# Patient Record
Sex: Female | Born: 1980 | Race: White | Hispanic: No | Marital: Single | Smoking: Current every day smoker
Health system: Southern US, Community
[De-identification: ages and names within clinical notes are randomized; demographics above are authoritative.]

## PROBLEM LIST (undated history)

## (undated) ENCOUNTER — Emergency Department (HOSPITAL_COMMUNITY): Admission: EM | Payer: No Typology Code available for payment source | Source: Home / Self Care

## (undated) DIAGNOSIS — F1910 Other psychoactive substance abuse, uncomplicated: Secondary | ICD-10-CM

## (undated) DIAGNOSIS — B977 Papillomavirus as the cause of diseases classified elsewhere: Secondary | ICD-10-CM

## (undated) DIAGNOSIS — D1809 Hemangioma of other sites: Secondary | ICD-10-CM

## (undated) DIAGNOSIS — Z9851 Tubal ligation status: Secondary | ICD-10-CM

## (undated) DIAGNOSIS — J45909 Unspecified asthma, uncomplicated: Secondary | ICD-10-CM

## (undated) DIAGNOSIS — O3440 Maternal care for other abnormalities of cervix, unspecified trimester: Secondary | ICD-10-CM

## (undated) DIAGNOSIS — Z9889 Other specified postprocedural states: Secondary | ICD-10-CM

## (undated) DIAGNOSIS — N903 Dysplasia of vulva, unspecified: Secondary | ICD-10-CM

## (undated) HISTORY — PX: ABDOMINAL HYSTERECTOMY: SHX81

## (undated) HISTORY — PX: CHOLECYSTECTOMY: SHX55

## (undated) HISTORY — PX: APPENDECTOMY: SHX54

---

## 1998-05-11 ENCOUNTER — Emergency Department (HOSPITAL_COMMUNITY): Admission: EM | Admit: 1998-05-11 | Discharge: 1998-05-11 | Payer: Self-pay | Admitting: Emergency Medicine

## 1998-05-21 ENCOUNTER — Emergency Department (HOSPITAL_COMMUNITY): Admission: EM | Admit: 1998-05-21 | Discharge: 1998-05-21 | Payer: Self-pay | Admitting: Emergency Medicine

## 1998-05-22 ENCOUNTER — Emergency Department (HOSPITAL_COMMUNITY): Admission: EM | Admit: 1998-05-22 | Discharge: 1998-05-22 | Payer: Self-pay | Admitting: Emergency Medicine

## 1998-06-05 ENCOUNTER — Emergency Department (HOSPITAL_COMMUNITY): Admission: EM | Admit: 1998-06-05 | Discharge: 1998-06-05 | Payer: Self-pay | Admitting: Emergency Medicine

## 1998-08-04 ENCOUNTER — Emergency Department (HOSPITAL_COMMUNITY): Admission: EM | Admit: 1998-08-04 | Discharge: 1998-08-04 | Payer: Self-pay | Admitting: Emergency Medicine

## 1998-09-23 ENCOUNTER — Emergency Department (HOSPITAL_COMMUNITY): Admission: EM | Admit: 1998-09-23 | Discharge: 1998-09-24 | Payer: Self-pay | Admitting: Emergency Medicine

## 1998-11-25 ENCOUNTER — Emergency Department (HOSPITAL_COMMUNITY): Admission: EM | Admit: 1998-11-25 | Discharge: 1998-11-25 | Payer: Self-pay | Admitting: Emergency Medicine

## 1999-01-21 ENCOUNTER — Emergency Department (HOSPITAL_COMMUNITY): Admission: EM | Admit: 1999-01-21 | Discharge: 1999-01-21 | Payer: Self-pay | Admitting: Emergency Medicine

## 2000-02-12 ENCOUNTER — Emergency Department (HOSPITAL_COMMUNITY): Admission: EM | Admit: 2000-02-12 | Discharge: 2000-02-12 | Payer: Self-pay | Admitting: Emergency Medicine

## 2000-02-12 ENCOUNTER — Encounter: Payer: Self-pay | Admitting: Emergency Medicine

## 2000-06-13 ENCOUNTER — Emergency Department (HOSPITAL_COMMUNITY): Admission: EM | Admit: 2000-06-13 | Discharge: 2000-06-13 | Payer: Self-pay | Admitting: Emergency Medicine

## 2001-05-16 ENCOUNTER — Emergency Department (HOSPITAL_COMMUNITY): Admission: EM | Admit: 2001-05-16 | Discharge: 2001-05-16 | Payer: Self-pay | Admitting: *Deleted

## 2001-12-18 ENCOUNTER — Emergency Department (HOSPITAL_COMMUNITY): Admission: EM | Admit: 2001-12-18 | Discharge: 2001-12-18 | Payer: Self-pay | Admitting: Emergency Medicine

## 2001-12-18 ENCOUNTER — Encounter: Payer: Self-pay | Admitting: Emergency Medicine

## 2002-03-09 ENCOUNTER — Emergency Department (HOSPITAL_COMMUNITY): Admission: EM | Admit: 2002-03-09 | Discharge: 2002-03-09 | Payer: Self-pay | Admitting: Emergency Medicine

## 2002-07-15 ENCOUNTER — Other Ambulatory Visit: Admission: RE | Admit: 2002-07-15 | Discharge: 2002-07-15 | Payer: Self-pay | Admitting: *Deleted

## 2002-10-25 ENCOUNTER — Inpatient Hospital Stay (HOSPITAL_COMMUNITY): Admission: AD | Admit: 2002-10-25 | Discharge: 2002-10-25 | Payer: Self-pay | Admitting: *Deleted

## 2002-11-01 ENCOUNTER — Inpatient Hospital Stay (HOSPITAL_COMMUNITY): Admission: AD | Admit: 2002-11-01 | Discharge: 2002-11-01 | Payer: Self-pay | Admitting: *Deleted

## 2003-01-01 ENCOUNTER — Encounter: Admission: RE | Admit: 2003-01-01 | Discharge: 2003-01-01 | Payer: Self-pay | Admitting: *Deleted

## 2003-01-07 ENCOUNTER — Encounter: Admission: RE | Admit: 2003-01-07 | Discharge: 2003-01-07 | Payer: Self-pay | Admitting: *Deleted

## 2003-01-09 ENCOUNTER — Inpatient Hospital Stay (HOSPITAL_COMMUNITY): Admission: AD | Admit: 2003-01-09 | Discharge: 2003-01-11 | Payer: Self-pay | Admitting: *Deleted

## 2003-01-11 ENCOUNTER — Encounter: Payer: Self-pay | Admitting: *Deleted

## 2003-01-18 ENCOUNTER — Inpatient Hospital Stay (HOSPITAL_COMMUNITY): Admission: AD | Admit: 2003-01-18 | Discharge: 2003-01-18 | Payer: Self-pay | Admitting: *Deleted

## 2003-01-18 ENCOUNTER — Encounter (INDEPENDENT_AMBULATORY_CARE_PROVIDER_SITE_OTHER): Payer: Self-pay | Admitting: Specialist

## 2003-01-18 ENCOUNTER — Inpatient Hospital Stay (HOSPITAL_COMMUNITY): Admission: AD | Admit: 2003-01-18 | Discharge: 2003-01-21 | Payer: Self-pay | Admitting: *Deleted

## 2003-01-27 ENCOUNTER — Inpatient Hospital Stay: Admission: EM | Admit: 2003-01-27 | Discharge: 2003-01-27 | Payer: Self-pay | Admitting: *Deleted

## 2003-04-02 ENCOUNTER — Other Ambulatory Visit: Admission: RE | Admit: 2003-04-02 | Discharge: 2003-04-02 | Payer: Self-pay | Admitting: *Deleted

## 2004-11-29 ENCOUNTER — Inpatient Hospital Stay (HOSPITAL_COMMUNITY): Admission: AD | Admit: 2004-11-29 | Discharge: 2004-11-29 | Payer: Self-pay | Admitting: Family Medicine

## 2005-03-15 ENCOUNTER — Encounter (INDEPENDENT_AMBULATORY_CARE_PROVIDER_SITE_OTHER): Payer: Self-pay | Admitting: Specialist

## 2005-03-15 ENCOUNTER — Inpatient Hospital Stay (HOSPITAL_COMMUNITY): Admission: EM | Admit: 2005-03-15 | Discharge: 2005-03-16 | Payer: Self-pay | Admitting: Emergency Medicine

## 2006-03-03 ENCOUNTER — Emergency Department (HOSPITAL_COMMUNITY): Admission: EM | Admit: 2006-03-03 | Discharge: 2006-03-04 | Payer: Self-pay | Admitting: *Deleted

## 2006-03-12 ENCOUNTER — Emergency Department (HOSPITAL_COMMUNITY): Admission: EM | Admit: 2006-03-12 | Discharge: 2006-03-12 | Payer: Self-pay | Admitting: Emergency Medicine

## 2006-04-03 ENCOUNTER — Emergency Department (HOSPITAL_COMMUNITY): Admission: EM | Admit: 2006-04-03 | Discharge: 2006-04-03 | Payer: Self-pay | Admitting: Emergency Medicine

## 2006-04-09 ENCOUNTER — Inpatient Hospital Stay (HOSPITAL_COMMUNITY): Admission: AD | Admit: 2006-04-09 | Discharge: 2006-04-09 | Payer: Self-pay | Admitting: Gynecology

## 2006-04-11 ENCOUNTER — Other Ambulatory Visit: Admission: RE | Admit: 2006-04-11 | Discharge: 2006-04-11 | Payer: Self-pay | Admitting: Obstetrics and Gynecology

## 2006-05-13 ENCOUNTER — Emergency Department (HOSPITAL_COMMUNITY): Admission: EM | Admit: 2006-05-13 | Discharge: 2006-05-13 | Payer: Self-pay | Admitting: Emergency Medicine

## 2006-05-20 ENCOUNTER — Emergency Department (HOSPITAL_COMMUNITY): Admission: EM | Admit: 2006-05-20 | Discharge: 2006-05-20 | Payer: Self-pay | Admitting: Emergency Medicine

## 2006-05-23 ENCOUNTER — Emergency Department (HOSPITAL_COMMUNITY): Admission: EM | Admit: 2006-05-23 | Discharge: 2006-05-24 | Payer: Self-pay | Admitting: Emergency Medicine

## 2006-06-03 ENCOUNTER — Emergency Department (HOSPITAL_COMMUNITY): Admission: EM | Admit: 2006-06-03 | Discharge: 2006-06-03 | Payer: Self-pay | Admitting: Emergency Medicine

## 2006-06-06 ENCOUNTER — Emergency Department (HOSPITAL_COMMUNITY): Admission: EM | Admit: 2006-06-06 | Discharge: 2006-06-06 | Payer: Self-pay | Admitting: Emergency Medicine

## 2006-06-16 ENCOUNTER — Emergency Department (HOSPITAL_COMMUNITY): Admission: EM | Admit: 2006-06-16 | Discharge: 2006-06-16 | Payer: Self-pay | Admitting: Emergency Medicine

## 2006-06-28 ENCOUNTER — Ambulatory Visit: Payer: Self-pay | Admitting: Gastroenterology

## 2006-07-04 ENCOUNTER — Emergency Department (HOSPITAL_COMMUNITY): Admission: EM | Admit: 2006-07-04 | Discharge: 2006-07-04 | Payer: Self-pay | Admitting: Emergency Medicine

## 2006-07-06 ENCOUNTER — Emergency Department (HOSPITAL_COMMUNITY): Admission: EM | Admit: 2006-07-06 | Discharge: 2006-07-06 | Payer: Self-pay | Admitting: Emergency Medicine

## 2006-07-07 ENCOUNTER — Emergency Department (HOSPITAL_COMMUNITY): Admission: EM | Admit: 2006-07-07 | Discharge: 2006-07-07 | Payer: Self-pay | Admitting: Emergency Medicine

## 2006-07-10 ENCOUNTER — Emergency Department (HOSPITAL_COMMUNITY): Admission: EM | Admit: 2006-07-10 | Discharge: 2006-07-10 | Payer: Self-pay | Admitting: Emergency Medicine

## 2006-07-26 ENCOUNTER — Emergency Department (HOSPITAL_COMMUNITY): Admission: EM | Admit: 2006-07-26 | Discharge: 2006-07-26 | Payer: Self-pay | Admitting: Emergency Medicine

## 2006-08-01 ENCOUNTER — Ambulatory Visit: Payer: Self-pay | Admitting: Gastroenterology

## 2006-08-11 ENCOUNTER — Emergency Department (HOSPITAL_COMMUNITY): Admission: EM | Admit: 2006-08-11 | Discharge: 2006-08-12 | Payer: Self-pay | Admitting: Emergency Medicine

## 2006-09-02 ENCOUNTER — Emergency Department (HOSPITAL_COMMUNITY): Admission: EM | Admit: 2006-09-02 | Discharge: 2006-09-02 | Payer: Self-pay | Admitting: Emergency Medicine

## 2006-09-10 ENCOUNTER — Emergency Department (HOSPITAL_COMMUNITY): Admission: EM | Admit: 2006-09-10 | Discharge: 2006-09-10 | Payer: Self-pay | Admitting: *Deleted

## 2006-09-17 ENCOUNTER — Emergency Department (HOSPITAL_COMMUNITY): Admission: EM | Admit: 2006-09-17 | Discharge: 2006-09-17 | Payer: Self-pay | Admitting: *Deleted

## 2006-09-21 ENCOUNTER — Emergency Department (HOSPITAL_COMMUNITY): Admission: EM | Admit: 2006-09-21 | Discharge: 2006-09-21 | Payer: Self-pay | Admitting: Emergency Medicine

## 2006-09-25 ENCOUNTER — Ambulatory Visit (HOSPITAL_COMMUNITY): Admission: RE | Admit: 2006-09-25 | Discharge: 2006-09-25 | Payer: Self-pay | Admitting: Specialist

## 2006-09-28 ENCOUNTER — Emergency Department (HOSPITAL_COMMUNITY): Admission: EM | Admit: 2006-09-28 | Discharge: 2006-09-28 | Payer: Self-pay | Admitting: Emergency Medicine

## 2006-10-24 ENCOUNTER — Emergency Department (HOSPITAL_COMMUNITY): Admission: EM | Admit: 2006-10-24 | Discharge: 2006-10-24 | Payer: Self-pay | Admitting: Emergency Medicine

## 2006-11-01 ENCOUNTER — Emergency Department (HOSPITAL_COMMUNITY): Admission: EM | Admit: 2006-11-01 | Discharge: 2006-11-01 | Payer: Self-pay | Admitting: Emergency Medicine

## 2006-11-15 ENCOUNTER — Emergency Department (HOSPITAL_COMMUNITY): Admission: EM | Admit: 2006-11-15 | Discharge: 2006-11-15 | Payer: Self-pay | Admitting: Emergency Medicine

## 2006-12-20 ENCOUNTER — Emergency Department: Payer: Self-pay

## 2007-01-20 ENCOUNTER — Emergency Department (HOSPITAL_COMMUNITY): Admission: EM | Admit: 2007-01-20 | Discharge: 2007-01-20 | Payer: Self-pay | Admitting: Emergency Medicine

## 2007-10-04 ENCOUNTER — Encounter
Admission: RE | Admit: 2007-10-04 | Discharge: 2008-01-02 | Payer: Self-pay | Admitting: Physical Medicine & Rehabilitation

## 2007-10-09 ENCOUNTER — Ambulatory Visit: Payer: Self-pay | Admitting: Physical Medicine & Rehabilitation

## 2007-10-11 ENCOUNTER — Ambulatory Visit (HOSPITAL_COMMUNITY)
Admission: RE | Admit: 2007-10-11 | Discharge: 2007-10-11 | Payer: Self-pay | Admitting: Physical Medicine & Rehabilitation

## 2007-10-15 ENCOUNTER — Ambulatory Visit: Payer: Self-pay | Admitting: Physical Medicine & Rehabilitation

## 2007-11-12 ENCOUNTER — Ambulatory Visit (HOSPITAL_COMMUNITY): Admission: RE | Admit: 2007-11-12 | Discharge: 2007-11-12 | Payer: Self-pay | Admitting: Neurosurgery

## 2007-11-15 ENCOUNTER — Encounter: Payer: Self-pay | Admitting: Neurosurgery

## 2007-12-18 ENCOUNTER — Inpatient Hospital Stay (HOSPITAL_COMMUNITY): Admission: RE | Admit: 2007-12-18 | Discharge: 2007-12-19 | Payer: Self-pay | Admitting: Interventional Radiology

## 2008-01-07 ENCOUNTER — Encounter: Payer: Self-pay | Admitting: Interventional Radiology

## 2008-02-01 ENCOUNTER — Encounter
Admission: RE | Admit: 2008-02-01 | Discharge: 2008-02-04 | Payer: Self-pay | Admitting: Physical Medicine & Rehabilitation

## 2008-02-04 ENCOUNTER — Ambulatory Visit: Payer: Self-pay | Admitting: Physical Medicine & Rehabilitation

## 2008-02-13 ENCOUNTER — Encounter: Payer: Self-pay | Admitting: Interventional Radiology

## 2008-09-30 ENCOUNTER — Emergency Department (HOSPITAL_COMMUNITY): Admission: EM | Admit: 2008-09-30 | Discharge: 2008-09-30 | Payer: Self-pay | Admitting: Emergency Medicine

## 2008-10-03 ENCOUNTER — Emergency Department (HOSPITAL_COMMUNITY): Admission: EM | Admit: 2008-10-03 | Discharge: 2008-10-03 | Payer: Self-pay | Admitting: Emergency Medicine

## 2010-06-06 ENCOUNTER — Emergency Department: Payer: Self-pay | Admitting: Emergency Medicine

## 2010-09-15 ENCOUNTER — Emergency Department: Payer: Self-pay | Admitting: Unknown Physician Specialty

## 2010-09-15 ENCOUNTER — Observation Stay: Payer: Self-pay | Admitting: Obstetrics and Gynecology

## 2010-10-03 ENCOUNTER — Emergency Department (HOSPITAL_BASED_OUTPATIENT_CLINIC_OR_DEPARTMENT_OTHER)
Admission: EM | Admit: 2010-10-03 | Discharge: 2010-10-03 | Disposition: A | Discharge status: Home / Self Care | Payer: Medicaid Other | Attending: Emergency Medicine | Admitting: Emergency Medicine

## 2010-10-03 DIAGNOSIS — O2690 Pregnancy related conditions, unspecified, unspecified trimester: Secondary | ICD-10-CM | POA: Insufficient documentation

## 2010-10-03 DIAGNOSIS — R109 Unspecified abdominal pain: Secondary | ICD-10-CM | POA: Insufficient documentation

## 2010-10-03 DIAGNOSIS — F17200 Nicotine dependence, unspecified, uncomplicated: Secondary | ICD-10-CM | POA: Insufficient documentation

## 2010-10-03 LAB — DIFFERENTIAL
Eosinophils Absolute: 0.1 10*3/uL (ref 0.0–0.7)
Eosinophils Relative: 1 % (ref 0–5)
Lymphs Abs: 1.6 10*3/uL (ref 0.7–4.0)
Monocytes Absolute: 0.4 10*3/uL (ref 0.1–1.0)
Monocytes Relative: 4 % (ref 3–12)

## 2010-10-03 LAB — URINALYSIS, ROUTINE W REFLEX MICROSCOPIC
Bilirubin Urine: NEGATIVE
Glucose, UA: NEGATIVE mg/dL
Hgb urine dipstick: NEGATIVE
Ketones, ur: NEGATIVE mg/dL
Protein, ur: NEGATIVE mg/dL

## 2010-10-03 LAB — CBC
MCH: 32.8 pg (ref 26.0–34.0)
MCV: 94.7 fL (ref 78.0–100.0)
Platelets: 284 10*3/uL (ref 150–400)
RDW: 14.2 % (ref 11.5–15.5)

## 2010-10-03 LAB — COMPREHENSIVE METABOLIC PANEL
Albumin: 3.3 g/dL — ABNORMAL LOW (ref 3.5–5.2)
BUN: 7 mg/dL (ref 6–23)
Creatinine, Ser: 0.5 mg/dL (ref 0.4–1.2)
Glucose, Bld: 98 mg/dL (ref 70–99)
Total Bilirubin: 0.4 mg/dL (ref 0.3–1.2)
Total Protein: 6.7 g/dL (ref 6.0–8.3)

## 2010-10-23 ENCOUNTER — Observation Stay: Payer: Self-pay
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
## 2010-11-09 NOTE — Consult Note (Signed)
REFERRING PHYSICIAN:  Dr. Sherlynn Stalls, [facility].   REASON FOR EVALUATION:  Foot pain.   CHIEF COMPLAINT:  Right greater than left foot pain, back pain greater  than neck pain.   HISTORY:  A 30 year old female who has a chief complaint of right  greater than left foot pain described as sharp, constant and aching.  She has no significant numbness or tingling of the foot.  The onset of  this was several months ago.  She was diagnosed with plantar fasciitis,  heel spur.  She had a posterior tibial nerve block at the ankle on the  right side which was not helpful.  She was treated with some Vicodin  extra-strength 7.5, then later going up to the 10 mg dosage which has  been helpful, she takes up to 5 of these a day.  After failure of  conservative care, including immobilization, blocks and medication, she  underwent a bone scan in February 2009, showing some increased uptake at  the midfoot bilaterally, as well as calcaneus area.  She did undergo  resection of heel spur and plantar fasciotomy per Dr. Suzette Battiest, but no  significant improvement postoperatively in terms of pain.  She was felt  to have neuritis of the calcaneal branch lateral plantar nerve on the  right, some arthralgia in MTPs of the foot.  She has received  antiinflammatories with partial relief.  She tried tramadol causing  drowsiness, but she states hydrocodone does not cause drowsiness.  She  has undergone EMG/NCV bilateral lower extremities showing no evidence of  entrapment neuropathy or polyneuropathy.  She has not had any lumbar  spine imaging recently.  She has had lumbosacral x-rays on 06/03/2006,  thoracic spine x-rays on 06/03/2006, which were read as normal.  She has  had a cervical CT on 07/10/2006, which was read as normal, this was  following a fall.   Review of her [facility] records reveals multiple ER visits for  abdominal pain, mainly right lower quadrant.  She states she has been  diagnosed  with ovarian cysts, which dose corroborate with both the  abdominal CT reading, as well as ER physician notes.  This has improved  over time and she has not had any ER visits for nearly a year in the  [facility] system.  Her other visits were for dental pain, back pain.  She usually received either Vicodin or Percocet during these visits.   Her average pain is rated 7-8/10, worse during the daytime and nighttime  hours, increases with walking and standing, basically putting weight on  hurts the most.  Her pain limits her household duties.  She does care  for her 32 year old, but does not do any type of work outside the home.  Review of systems is positive for anxiety, as well as intermittent  abdominal pain, although the abdominal pain is less frequent than it  used to be.   PRIMARY CARE PHYSICIAN:  Dr. August Saucer at general medical clinic in  [place].   PAST SURGICAL HISTORY:  Gallbladder surgery, lap chole in 2006, in  addition to her foot surgery as noted above.   PHYSICAL EXAMINATION:  VITAL SIGNS:  Blood pressure 103/68, pulse 104,  respiratory rate 18, O2 sats 98% on room air.  GENERAL:  She is a mildly overweight young female in no acute distress.  NECK:  Range of motion is normal.  EXTREMITIES:  Upper extremity range of motion and strength are normal.  Sensation in  the upper extremities is normal.  Deep tendon reflexes are  normal.  In the spine, she has normal range of motion in forward flexion  and extension, however, flexion causes more pain than with extension at  end range.  She has no significant tenderness in the thoracic  paraspinals, however, she does have moderate tenderness in the lumbar  paraspinals.  She has no active fibromyalgia tender points.  Her hip,  knee and ankle range of motion are normal.  Her deep tendon reflexes in  her lower extremities are normal.  Sensation is normal in the lower  extremities.  She has no hypersensitivity to touch in the feet or over   the operative area.  She has no skin discoloration, no nail dystrophic  changes.  No temperature differences between the left and right foot and  no evidence of hyperemia.  Her peripheral pulses are normal bilateral  upper and lower extremities.  There is no evidence of swelling in the  upper or lower extremities.   Gait shows no evidence of toe drag or knee instability.  She could go up  on her toes, as well as on her heels, but this causes pain.  She does  reduce __________ space on the right foot secondary to pain.   IMPRESSION:  Foot pain.  Fairly unremarkable physical exam findings.  Her main pain is with a bowstring sign with straight leg raising,  causing both foot pain and back pain.  She has no signs of focal  neurologic deficits.  Her feet themselves show no obvious deformities.  She does have some plantar surface tenderness which is fairly  generalized.   PLAN:  1. I would like to check MRI of the lumbar spine to see if there are      any signs of radiculopathy or see if there is any signs nerve root      compression in the L5-S1 region.  2. In terms of pain, would start some Neurontin empirically to see if      this is a more of a neuropathic type pain since I do not see this      having being used before, she does not recall being on this before.  3. If MRI is negative, consider sympathetic nerve block, although she      has no visible signs of RSD, this remains in the differential.    Thank you for this interesting consultation.  I will keep you apprised  of her progress.  I will check urine drug screen and if no signs of  illicit drugs or nondisclosed opiates, would be able to take over her  hydrocodone prescription.      Erick Colace, M.D.  Electronically Signed     AEK/MedQ  D:10/09/2007 12:40:21  T:10/09/2007 14:02:57  Job #:  [id number]   cc:   Sherlynn Stalls  Fax: [phone]
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
## 2010-11-09 NOTE — Assessment & Plan Note (Signed)
CHIEF COMPLAINT:  Right greater than left foot pain.   HISTORY:  The patient is a 30 year old female.  She was seen by me in  initial consultation, October 09, 2007.  I did order MRI of the lumbar  spine.  After examination and thorough discussion, she turned out to  have a T11 vertebral body hemangioma, was extended into the epidural  space causing moderate stenosis.  She was sent to Dr. Jeral Fruit,  Neurosurgery, who referred her onto Dr. Corliss Skains for embolization of  the hemangioma, which was performed and was partially successful in  that, it reduced the size of the hemangioma.  This, however, did not  improve her symptomatology in her lower extremities or her back.  Low  back pain is primarily normal in sacral area and her lower extremity  pain involves both knees and the feet.  She no longer uses a cane to  ambulate.  Her average pain is an 8-9/10 range.  Sleep is poor.  She  walks 3-5 minutes at a time.  She does not climb steps.  She does not  drive.  She needs some assistance with household duties and shopping,  but otherwise she is independent.   REVIEW OF SYSTEMS:  Positive for walking, anxiety, and nausea.   SOCIAL HISTORY:  She is single.  She smokes less than a half-pack per  day and denies any drug use.   FAMILY HISTORY:  Diabetes.  She has been taking oxycodone 5 mg  prescribed by Dr. Jeral Fruit and states she has more pain in the morning.  She takes 2 of them in the morning.  She denies taking Celebrex at the  current time.   PHYSICAL EXAMINATION:  VITAL SIGNS:  Blood pressure 119/80, pulse 89,  respirations 18, O2 sat 98% on room air.  GENERAL:  No acute distress.  Mood and affect are appropriate.  Her gait  is normal.  She has normal deep tendon reflexes in the lower  extremities.  She has normal strength in the lower extremities.   She has normal sensation in the lower extremities with the exception of  right L5 and S1 dermatomes.   IMPRESSION:  Thoracic stenosis to  the vertebral body hemangioma.  It  sounds like that area has been relatively decompressed.  However  continues to have symptomatology in lower extremities, which could be  persistent due to the period of time that she had compromise of the  lower quadrant.  She does not have any signs of neurogenic bowel or  bladder or any spinal cord injury.  She has no gait disturbance.  In  terms of her low back, the pain is more in the lower lumbar spine.  She  has pain in the lumbosacral junction, but has good range of motion.  She  may have some other pain generators such as sacroiliac or lumbar facets  in addition to the above given anatomic location.   The patient does not want any surgical intervention.   We will repeat a urine drug screen given that we really have not checked  one in about 4 months.  I have written oxycodone 5 mg prescription 2  p.o. q.a.m. and 1 p.o. b.i.d.   We need to trial another adjuvant medication such as higher dose  Neurontin versus Lyrica.  I did indicate I would not prescribe Xanax or  any other benzodiazepine, concerns with narcotic and analgesics giving  dietary side effects with interactions.  Given that she is on Prozac, I  would like to actually substitute Cymbalta, which would have a  nerve pain component as well as anxiety and depression.  Started on 30  mg and she tapers off the Prozac.  I will see her back in 1 month.      Erick Colace, M.D.  Electronically Signed     AEK/MedQ  D:  02/04/2008 17:42:49  T:  02/05/2008 09:02:31  Job #:  [id number]   cc:   Hilda Lias, M.D.  Fax: [phone]   Sherlynn Stalls  Fax: [phone]   Simonne Maffucci K. Corliss Skains, M.D.  Fax: [phone]
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
## 2010-11-12 NOTE — Discharge Summary (Signed)
   NAME:  Sabrina Andrews, Sabrina Andrews                         ACCOUNT NO.:  [id number]   MEDICAL RECORD NO.:  [id number]                   PATIENT TYPE:  INP   LOCATION:  9103                                 FACILITY:  [facility]   PHYSICIAN:  Georgina Peer, M.D.              DATE OF BIRTH:  24-Jun-1981   DATE OF ADMISSION:  01/18/2003  DATE OF DISCHARGE:  01/21/2003                                 DISCHARGE SUMMARY   ADMISSION DIAGNOSES:  1. Post dates pregnancy.  2. Gestational diabetes.   DISCHARGE DIAGNOSES:  1. Post dates pregnancy.  2. Gestational diabetes.  3. Failure to progress.  4. Meconium stained fluid.  5. Status post cesarean section.  6. Compensated postoperative anemia.   BRIEF HISTORY:  This is a 30 year old gravida 1, para 0, EDC January 07, 2003  presented on January 18, 2003 with failure to progress in labor and underwent a  cesarean section by Fayrene Fearing A. Ashley Royalty, M.D.  There was delivered a viable  female infant who had some respiratory troubles, went to the NICU for further  monitoring.  The patient did well postoperatively.  She passed flatus by the  second day which she was on regular diet.  Her hemoglobin was 8.9 from a pre  delivery of 13.2.  Her WBC was down to 14.7 and platelet count 231,000.  Her  blood pressure and vital signs were normal and she remained afebrile.  By  the third day she was on Percocet and Motrin for pain.  She was started on  iron.  She desired Depo-Provera for contraception.  Her staples were  removed.  Incision was clean, dry, and intact.  She was sent home in good  condition with instruction booklet to follow up in the office in two weeks.  She was given prescriptions for Motrin 600 mg #60, Percocet 5/325 #50, and  Hemocyte F one daily #30.  All questions were answered.  She was hoping the  infant would be discharged within a week from the NICU.                                               Georgina Peer, M.D.    JPN/MEDQ  D:  01/21/2003  T:   01/21/2003  Job:  [id number]

## 2010-11-12 NOTE — Op Note (Signed)
NAME:  Sabrina Andrews, Sabrina Andrews                         ACCOUNT NO.:  [id number]   MEDICAL RECORD NO.:  [id number]                   PATIENT TYPE:  INP   LOCATION:  9103                                 FACILITY:  [facility]   PHYSICIAN:  James A. Ashley Royalty, M.D.             DATE OF BIRTH:  [birth year]   DATE OF PROCEDURE:  01/18/2003  DATE OF DISCHARGE:                                 OPERATIVE REPORT   PREOPERATIVE DIAGNOSES:  1. Intrauterine pregnancy at 41 weeks and 4 days gestation.  2. Meconium stained amniotic fluid.  3. Arrest disorder of dilatation in labor.  4. Rh negative.   POSTOPERATIVE DIAGNOSES:  1. Intrauterine pregnancy at 41 weeks and 4 days gestation.  2. Meconium stained amniotic fluid.  3. Arrest disorder of dilatation in labor.  4. Rh negative.   PROCEDURE:  Primary low transverse cesarean section.   SURGEON:  Rudy Jew. Ashley Royalty, M.D.   ANESTHESIA:  Epidural, local (0.5% Marcaine, 1% Xylocaine.   FINDINGS:  Seven pound, 0.5 ounce (3190 grams) female.  Apgars 3 at one  minute, 8 at five minutes sent to the NICU.   COMPLICATIONS:  None.   PACKS AND DRAINS:  Foley.   INSTRUMENT COUNT:  Sponge, needle and instrument counts reported as correct  times two.   PROCEDURE:  The patient was taken to the operating room and placed in the  dorsal supine position.  Surgical levels of epidural anesthetic were  administered.  A Foley catheter had been previously placed.  She was prepped  and draped in the usual manner for abdominal surgery.  The left side of the  patient's abdomen was difficult to anesthetic with the epidural.  Hence,  additional local as mentioned above was infiltrated into this area with some  improvement.  A Pfannenstiel incision was then made down to the level of the  fascia.  The fascia was nicked with a knife and incised transversely with  Mayo scissors.  The underlying rectus muscles were separated from the fascia  using sharp and blunt dissection.  Some  additional Nesacaine was poured into  the incision to aid in intraoperative analgesia.  The rectus muscles were  separated from the overlying fascia using sharp and blunt dissection. The  rectus muscles were separated in the midline exposing the peritoneum which  was elevated with hemostats and atraumatically with Metzenbaum scissors.  Initially, the bladder was noted to be distended and after manipulating the  Foley the bladder drained nicely allowing the peritoneum to be incised  longitudinally without difficulty.  The bladder blade was placed.  The  intrauterine serosa was sharply and bluntly opened in a transverse fashion  to create a bladder flap.  The bladder was held inferiorly with the bladder  blade.  The uterus was then entered through a low transverse incision using  sharp and blunt dissection.  As previously mentioned meconium was  encountered.  The fetal head  was delivered through the incision and DeLee  suction used on the airway.  Next the fetus was fully delivered.  The cord  was triply clamped and cut and the infant given immediately to the waiting  pediatric's team.  Arterial cord gas was obtained through an isolated  segment of cord.  Next, regular cord blood was obtained.  The placenta and  membranes were removed in their entirety and submitted to pathology for  histologic studies.  The placenta and membranes appeared to be stained with  meconium rather deeply suggesting a longer term time course of meconium  stained amniotic fluid.  The uterus was then exteriorized. The uterus was  closed in two running layers of #1 Vicryl.  The first was a running locking  layer.  The second was a running, intermittently running and imbricating  layer.  Two additional figure-of-eight sutures were required to obtain  hemostasis.  Hemostasis was noted.  The adnexa were inspected and found to  be normal.  The remainder of the uterus was inspected and also found to be  normal.  The uterus  and adnexa were then returned to the abdominal cavity.  Copious irrigation was accomplished. Hemostasis was noted.  Next the fascia  was closed with 0 Vicryl in a running fashion.  The subcutaneous layer was  copiously irrigated.  Hemostasis was noted.  The skin was closed with  staples.  The patient tolerated the procedure extremely well and was  returned to the recovery room in good condition.   The infant was noted to be grunting shortly after delivery.  As a precaution  he was taken to the neonatal intensive care unit for further workup and  observation.                                               James A. Ashley Royalty, M.D.    JAM/MEDQ  D:  01/18/2003  T:  01/19/2003  Job:  [id number]

## 2010-11-12 NOTE — Assessment & Plan Note (Signed)
[facility]                         GASTROENTEROLOGY OFFICE NOTE   MCKINSLEY, KOELZER                      MRN:          [id number]  DATE:06/28/2006                            DOB:          09-27-1980    REFERRING PHYSICIAN:  Lacretia Leigh. Quintella Reichert, M.D.   REASON FOR REFERRAL:  Dr. Tinnie Gens C. Hooper asked me to evaluate Ms.  Marland in consultation regarding abdominal pains and diarrhea.   HISTORY OF PRESENT ILLNESS:  Ms. Gehring is a pleasant 30 year old woman  who has had three to four months of loose stools and lower abdominal  pain.  She said prior to this she was moving her bowels once or twice a  day.  It was soft and well-formed, without any abdominal discomfort.  Since then though she wakes up every morning with very loose stools, and  she has sharp lower abdominal pains that are there nearly constantly.  The pains are somewhat improved when she moves her bowels, but not  completely improved.  She gets very diaphoretic and nauseated with her  bowel movements.  She will move her bowels approximately six to seven  times a day.  She actually saw blood yesterday.  She has presented to  the emergency room several times because of the pains.  She is given a  short course of narcotic pain medicine and sent home.  She has had  imaging studies and laboratory tests done recently.  The lab tests two  weeks ago showed a CBC with a white count of 10.3, normal hemoglobin,  normal platelets.  A complete metabolic profile that was normal.   She had a CT scan of the abdomen and pelvis with IV and oral contrast.  This showed normal bowels.  It described a complex right cystic ovarian  mass measuring 3.8 cm to 5.7 cm.  This was followed up with a pelvic  ultrasound that found the cyst was a simple cyst and was smaller.  This  ultrasound was in September 2007.  She has seen a gynecologist, Dr.  Layne Benton, in the past, who believes that the cysts are probably not  causing her discomfort.  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  She is due to follow up with her gynecologist tomorrow.   REVIEW OF SYSTEMS:  Notable for a 15 to 20 pound weight gain in the past  year.  No eye symptoms or rashes.  No lumps or bumps on her shins.  The  rest of the review of systems is essentially normal and is available on  the nursing intake sheet.   PAST MEDICAL/SURGICAL HISTORY:  1. Asthma.  2. Anxiety.  3. Depression.  4. Status post lap/cholecystectomy in 2006, with normal IOC, by Dr.      Anselm Pancoast. [place].  5. Status post C-section.  6. Status post LEEP in 2007.   CURRENT MEDICATIONS:  1. Lexapro.  2. An inhaler.   ALLERGIES:  No known drug allergies.   SOCIAL HISTORY:  She is single with one son.  Unemployed.  Smokes 1/2  pack a day.  Non-drinker.  FAMILY HISTORY:  Paternal grandfather with colon cancer.  Someone in her  family may have IBD.  She is not clear on this.   PHYSICAL EXAMINATION:  VITAL SIGNS:  Height 5 feet 2 inches, weight 185  pounds, blood pressure 100/60, pulse 90.  CONSTITUTIONAL:  Obese, otherwise well-appearing.  NEUROLOGIC:  Alert and oriented x3.  HEENT:  Eyes:  Extraocular movements intact.  Mouth and oropharynx  moist.  No lesions.  NECK:  Supple, no lymphadenopathy.  CARDIOVASCULAR:  Regular rate and rhythm.  LUNGS:  Clear to auscultation bilaterally.  ABDOMEN:  Soft, nontender, non-distended.  Normal bowel sounds.  EXTREMITIES:  No lower extremity edema.  SKIN:  No rashes or lesions on the lower extremities.   ASSESSMENT/PLAN:  A 30 year old woman with chronic diarrhea, chronic  lower abdominal pains.  Question a family history of irritable bowel  disease.   I think we should proceed with a full colonoscopy at her soonest  convenience.  She may have inflammatory bowel disease.  Chronic  infection such as Giardia may also present this way.  I will get a  repeat set of blood tests, including a CBC  and a complete metabolic  profile.  She will get thyroid testing and tissue trans-glutamic acid  done today.  Will also send her stool for C. difficile, stool culture,  Giardia, ova and parasites and fecal white cells.  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  I would not start her on narcotics at  this point.     Rachael Fee, MD  Electronically Signed    DPJ/MedQ  DD: 06/28/2006  DT: 06/28/2006  Job #: [id number]   cc:   Lacretia Leigh. Quintella Reichert, M.D.

## 2010-11-12 NOTE — Op Note (Signed)
NAME:  Sabrina Andrews, Sabrina Andrews               ACCOUNT NO.:  [id number]   MEDICAL RECORD NO.:  [id number]          PATIENT TYPE:  INP   LOCATION:  1609                         FACILITY:  [facility]   PHYSICIAN:  Anselm Pancoast. Weatherly, M.D.DATE OF BIRTH:  [birth year]   DATE OF PROCEDURE:  03/15/2005  DATE OF DISCHARGE:                                 OPERATIVE REPORT   PREOPERATIVE DIAGNOSIS:  Acute cholecystitis with stones.   POSTOPERATIVE DIAGNOSIS:  Acute cholecystitis with stones.   OPERATION:  Laparoscopic cholecystectomy with cholangiogram.   SURGEON:  Anselm Pancoast. Zachery Dakins, M.D.   ASSISTANT:  Nurse.   ANESTHESIA  General.   HISTORY OF PRESENT ILLNESS:  Sabrina Andrews is a 30 year old overweight  female who presented to the emergency room this morning after awakening with  a severe epigastric pain approximately 3 a.m.  She was seen by the ER  physician.  Laboratory studies showed a normal urine, a slightly elevated  white count of 10,400 and normal liver function studies.  She had an  ultrasound of the gallbladder early this morning that showed acutely  inflamed gallbladder with stone impacted in the neck of the gallbladder.  I  was asked to see the patient and saw her at approximately 9:30.  I suggested  we go ahead to the OR and schedule for later this evening, and she was in  agreement.   DESCRIPTION OF PROCEDURE:  She was given 3 g of Unasyn and PAS stockings.  The patient is 5 feet 2 inches, about 175 pounds.  Induction of general  anesthesia, endotracheal tube/oral tube into the stomach.  The abdomen was  prepped with Betadine surgical scrub solution and draped to manner.  A small  incision was made below the umbilicus.  The [employer] was used to identify  the fascia, and this was picked up with two Kochers and a small opening made  entering into the peritoneal cavity.  The patient had very generous  preperitoneal fatty tissue and the pursestring suture of O Vicryl was placed  and  the Hasson cannula introduced.  The gallbladder was tense.  Fortunately,  there were not a lot of adhesions.  The upper 10 mm trocar was placed in the  subxiphoid area in _________ fashion, and two lateral 5 mm trocars were  placed in the right lateral subcostal area. The gallbladder was retracted  upward.  The proximal portion was identified, and I carefully opened the  peritoneum and then dissected the cystic duct.  The stone impacted in the  neck of the gallbladder, and it was kind of pushed back into the body of the  gallbladder.  Then I encompassed the cystic duct and clipped it flush with  the gallbladder. A Cook catheter was introduced into the proximal cystic  duct and held in place with a clip.  The cholangiogram was performed which  showed good flow in the extrahepatic biliary system, _________.  The  catheter was removed.  The cystic duct was triply clipped and divided distal  to the three clips.  The perineum was kind of carefully opened.  The cystic  artery  was identified and doubly clipped proximally, singly distally and  divided.  We used cautery to free the gallbladder from its bed. It was very  edematous, and there were little bleeders from the bed that were lightly  coagulated.  The gallbladder was freed circumferentially because it was  acutely inflamed and the size of the stones.  I did place it into an  EndoCatch bag for ease of bringing it out over the fascial defect.  Next,  the irrigating fluid had been aspirated.  I did place a little piece of  Surgicel in the  gallbladder bed area and then switched the camera to 10 mm  port and then grabbed the bag the mother-in-law retractor and withdrew the  bag containing the gallbladder. I placed an additional figure-of-eight  suture of Vicryl, and the fascia and umbilicus and tied both and then  anesthetized the fascia at the umbilicus.  There was no evidence of any  bleeding at the gallbladder bed area.  The little irrigating  fluid was  aspirated and we sort of look in the lower abdomen and everything was doing  nicely, and then I removed the top 5 mm port and released the carbon dioxide  and withdrew the upper 10 mm port. The subcutaneous wounds were closed with  4-0 Vicryl, and Benzoin and Steri-Strips on the skin.  The patient tolerated  procedure nicely and then was sent to recovery room in stable postoperative  condition.  She should be able to be discharged and hopefully will have no  further problems this.           ______________________________  Anselm Pancoast. Zachery Dakins, M.D.     WJW/MEDQ  D:  03/15/2005  T:  03/16/2005  Job:  [id number]

## 2010-11-12 NOTE — Discharge Summary (Signed)
   NAME:  Sabrina Andrews, Sabrina Andrews                         ACCOUNT NO.:  [id number]   MEDICAL RECORD NO.:  [id number]                   PATIENT TYPE:  INP   LOCATION:  9164                                 FACILITY:  [facility]   PHYSICIAN:  Georgina Peer, M.D.              DATE OF BIRTH:  November 28, 1980   DATE OF ADMISSION:  01/09/2003  DATE OF DISCHARGE:  01/11/2003                                 DISCHARGE SUMMARY   ADMISSION DIAGNOSIS:  Post term gestational diabetes.   DISCHARGE DIAGNOSES:  1. Post term gestational diabetes.  2. Failed induction.   BRIEF HISTORY AND PHYSICAL:  A 30 year old primigravida, [facility] January 07, 2003,  with difficult compliance with protocol for monitoring blood sugars with  gestational diabetes, which was diagnosed at 36 weeks.  She was brought in  for induction with the cervix long and closed and a reactive nonstress test.  She received Cytotec on the evening of July 15.  She received Pitocin all  day on July 16, Cervidil until 6 a.m. on July 17, and then Pitocin up to 18  milliunits.  In spite of the this she was not feeling any significant  regular contractions.  She was having severe hip and back pain from the  positioning in bed.  Cervix had changed only to 1 cm, 50%, -1 station.  A  long induction process was contemplated and the patient had decided that she  would like to stop the induction process for now and try later if she did  not go into spontaneous labor.  Given the fact that the fetal heart rate was  reactive and her blood sugars were normal with CBGs, I had agreed to send  her home after getting an ultrasound to ensure that estimated fetal weight  and AFI were normal.  She will be discharged home to follow up and return on  July 26 at 6:45 a.m. for repeat induction should she not deliver in the  interim.                                                 Georgina Peer, M.D.    JPN/MEDQ  D:  01/11/2003  T:  01/12/2003  Job:  [id number]

## 2011-01-15 ENCOUNTER — Emergency Department (HOSPITAL_BASED_OUTPATIENT_CLINIC_OR_DEPARTMENT_OTHER)
Admission: EM | Admit: 2011-01-15 | Discharge: 2011-01-15 | Discharge status: Against Medical Advice | Payer: Medicaid Other | Attending: Emergency Medicine | Admitting: Emergency Medicine

## 2011-01-15 ENCOUNTER — Encounter: Payer: Self-pay | Admitting: *Deleted

## 2011-01-15 DIAGNOSIS — O2690 Pregnancy related conditions, unspecified, unspecified trimester: Secondary | ICD-10-CM | POA: Insufficient documentation

## 2011-01-15 DIAGNOSIS — R109 Unspecified abdominal pain: Secondary | ICD-10-CM | POA: Insufficient documentation

## 2011-01-15 DIAGNOSIS — O26899 Other specified pregnancy related conditions, unspecified trimester: Secondary | ICD-10-CM

## 2011-01-15 DIAGNOSIS — R110 Nausea: Secondary | ICD-10-CM | POA: Insufficient documentation

## 2011-01-15 DIAGNOSIS — B977 Papillomavirus as the cause of diseases classified elsewhere: Secondary | ICD-10-CM | POA: Insufficient documentation

## 2011-01-15 HISTORY — DX: Papillomavirus as the cause of diseases classified elsewhere: B97.7

## 2011-01-15 LAB — COMPREHENSIVE METABOLIC PANEL
ALT: 11 U/L (ref 0–35)
AST: 10 U/L (ref 0–37)
Albumin: 2.7 g/dL — ABNORMAL LOW (ref 3.5–5.2)
Alkaline Phosphatase: 94 U/L (ref 39–117)
CO2: 21 mEq/L (ref 19–32)
Chloride: 102 mEq/L (ref 96–112)
Creatinine, Ser: 0.5 mg/dL (ref 0.50–1.10)
GFR calc non Af Amer: >60 mL/min (ref >60)
Potassium: 3.4 mEq/L — ABNORMAL LOW (ref 3.5–5.1)
Sodium: 136 mEq/L (ref 135–145)
Total Bilirubin: 0.1 mg/dL — ABNORMAL LOW (ref 0.3–1.2)

## 2011-01-15 LAB — CBC
MCV: 95.5 fL (ref 78.0–100.0)
Platelets: 276 10*3/uL (ref 150–400)
RBC: 3.56 MIL/uL — ABNORMAL LOW (ref 3.87–5.11)
RDW: 14.5 % (ref 11.5–15.5)
WBC: 12.1 10*3/uL — ABNORMAL HIGH (ref 4.0–10.5)

## 2011-01-15 LAB — URINALYSIS, ROUTINE W REFLEX MICROSCOPIC
Hgb urine dipstick: NEGATIVE
Leukocytes, UA: NEGATIVE
Specific Gravity, Urine: 1.029 (ref 1.005–1.030)
Urobilinogen, UA: 0.2 mg/dL (ref 0.0–1.0)

## 2011-01-15 MED ORDER — SODIUM CHLORIDE 0.9 % IV BOLUS (SEPSIS)
1000.0000 mL | Freq: Once | INTRAVENOUS | Status: AC
  Administered 2011-01-15: 1000 mL via INTRAVENOUS

## 2011-01-15 MED ORDER — ONDANSETRON HCL 4 MG/2ML IJ SOLN
4.0000 mg | Freq: Once | INTRAMUSCULAR | Status: AC
  Administered 2011-01-15: 4 mg via INTRAVENOUS
  Filled 2011-01-15 (×2): qty 2

## 2011-01-15 NOTE — ED Notes (Signed)
Pt states that her due date is7/31 and that last Pm she began having low abd pain and pressure as well as vagianal DC pt is scheduled for a C section next week pt also with N/V

## 2011-01-15 NOTE — ED Provider Notes (Signed)
History     Chief Complaint  Patient presents with  . Nausea  . Vaginal Discharge  . Abdominal Pain   HPI Comments: Patient presents with complaint of lower abdominal pain and vaginal pain which is coming in waves, started last night, is associated with nausea and vomiting. She has also had 5 episodes of watery diarrhea in the last 24 hours. She notes that her father has similar symptoms with nausea vomiting and diarrhea. Symptoms are gradually getting worse. Nothing makes it better or worse. She is a G2 P1 at 38-1/2 weeks. She is cared for by [facility] because of the high-risk status related to vulvar lesions and HPV. Patient also admits to associated dysuria.  Patient is a 30 y.o. female presenting with vaginal discharge and abdominal pain. The history is provided by the patient.  Vaginal Discharge Associated symptoms include abdominal pain.  Abdominal Pain The primary symptoms of the illness include abdominal pain and vaginal discharge.    Past Medical History  Diagnosis Date  . HPV (human papilloma virus) infection     Past Surgical History  Procedure Date  . Cholecystectomy   . Cesarean section     History reviewed. No pertinent family history.  History  Substance Use Topics  . Smoking status: Never Smoker   . Smokeless tobacco: Not on file  . Alcohol Use: No    OB History    Grav Para Term Preterm Abortions TAB SAB Ect Mult Living   3 1 1              Review of Systems  Gastrointestinal: Positive for abdominal pain.  Genitourinary: Positive for vaginal discharge.  All other systems reviewed and are negative.    Physical Exam  BP 118/77  Pulse 102  Temp(Src) 98.1 F (36.7 C) (Oral)  Resp 24  SpO2 96%  LMP 04/09/2010  Physical Exam  Nursing note and vitals reviewed. Constitutional: She appears well-developed and well-nourished. No distress.  HENT:  Head: Normocephalic and atraumatic.  Mouth/Throat: Oropharynx is clear and moist. No  oropharyngeal exudate.  Eyes: Conjunctivae and EOM are normal. Pupils are equal, round, and reactive to light. Right eye exhibits no discharge. Left eye exhibits no discharge. No scleral icterus.  Neck: Normal range of motion. Neck supple. No JVD present. No thyromegaly present.  Cardiovascular: Normal rate, regular rhythm, normal heart sounds and intact distal pulses.  Exam reveals no gallop and no friction rub.   No murmur heard. Pulmonary/Chest: Effort normal and breath sounds normal. No respiratory distress. She has no wheezes. She has no rales.  Abdominal: Soft. Bowel sounds are normal. She exhibits no mass. There is no tenderness.       Gravid uterus  Genitourinary:       Chaperone present for exam.  On sterile vaginal exam, Patient has 0 station, 50% effacement, no dilation.  There is mild amount of thick vaginal discharge. No bleeding or pooling of fluids noted.  Musculoskeletal: Normal range of motion. She exhibits no edema and no tenderness.  Lymphadenopathy:    She has no cervical adenopathy.  Neurological: She is alert. Coordination normal.  Skin: Skin is warm and dry. No rash noted. She is not diaphoretic. No erythema.  Psychiatric: She has a normal mood and affect. Her behavior is normal.    ED Course  Procedures  MDM Patient is having rhythmic pain in her lower abdomen and vagina. I have recommended that she be transferred to her OB/GYN's care however she has refused this  intervention citing that her child has no care tonight. I have again discussed this with her at length however she has refused our transport to her OB/GYN's care. Will check urinalysis CBC and give a liter bolus with some Zofran for symptomatic improvement of her symptoms. Patient will need to sign AMA form prior to leaving department due to her high risk status.  Blood work and urinalysis did not reveal a cause of the patient's symptoms. She still desires to leave AGAINST MEDICAL ADVICE. Precautions given and  explained to patient the indications for admission, risks of leaving without medical stabilizing therapy including risks to her unborn child. She is unable to verbalize to me been perfect understanding the risks as outlined and still wants to go.    Vida Roller, MD 01/15/11 2138

## 2011-01-22 ENCOUNTER — Observation Stay: Payer: Self-pay | Admitting: Obstetrics & Gynecology

## 2011-02-02 ENCOUNTER — Emergency Department: Payer: Self-pay | Admitting: Emergency Medicine

## 2011-02-26 ENCOUNTER — Emergency Department: Payer: Self-pay | Admitting: Emergency Medicine

## 2011-03-22 ENCOUNTER — Emergency Department: Payer: Self-pay | Admitting: Emergency Medicine

## 2011-03-24 LAB — BASIC METABOLIC PANEL
BUN: 10
BUN: 9
CO2: 23
CO2: 27
Calcium: 9.2
Calcium: 9.4
Chloride: 105
Creatinine, Ser: 0.78
Creatinine, Ser: 0.84
GFR calc Af Amer: >60
GFR calc non Af Amer: >60
Glucose, Bld: 76

## 2011-03-24 LAB — DIFFERENTIAL
Basophils Absolute: 0.1
Basophils Relative: 1
Lymphocytes Relative: 30
Monocytes Absolute: 0.5
Neutro Abs: 5.5

## 2011-03-24 LAB — CBC
MCHC: 35
MCHC: 35.4
MCV: 96.3
Platelets: 299
Platelets: 309
RBC: 4.15
RDW: 13.6
WBC: 12.5 — ABNORMAL HIGH

## 2011-03-24 LAB — HEPARIN LEVEL (UNFRACTIONATED): Heparin Unfractionated: <0.1 — ABNORMAL LOW

## 2011-03-24 LAB — PROTIME-INR: Prothrombin Time: 12.1

## 2011-03-24 LAB — APTT: aPTT: 29

## 2011-04-15 ENCOUNTER — Emergency Department: Payer: Self-pay | Admitting: Emergency Medicine

## 2011-04-29 ENCOUNTER — Emergency Department: Payer: Self-pay | Admitting: Unknown Physician Specialty

## 2011-05-11 ENCOUNTER — Emergency Department: Payer: Self-pay

## 2011-05-21 ENCOUNTER — Encounter (HOSPITAL_BASED_OUTPATIENT_CLINIC_OR_DEPARTMENT_OTHER): Payer: Self-pay | Admitting: Emergency Medicine

## 2011-05-21 ENCOUNTER — Emergency Department (INDEPENDENT_AMBULATORY_CARE_PROVIDER_SITE_OTHER): Payer: Medicaid Other

## 2011-05-21 ENCOUNTER — Emergency Department (HOSPITAL_BASED_OUTPATIENT_CLINIC_OR_DEPARTMENT_OTHER)
Admission: EM | Admit: 2011-05-21 | Discharge: 2011-05-21 | Disposition: A | Discharge status: Home / Self Care | Payer: Medicaid Other | Attending: Emergency Medicine | Admitting: Emergency Medicine

## 2011-05-21 DIAGNOSIS — A084 Viral intestinal infection, unspecified: Secondary | ICD-10-CM

## 2011-05-21 DIAGNOSIS — N949 Unspecified condition associated with female genital organs and menstrual cycle: Secondary | ICD-10-CM

## 2011-05-21 DIAGNOSIS — A088 Other specified intestinal infections: Secondary | ICD-10-CM | POA: Insufficient documentation

## 2011-05-21 DIAGNOSIS — R109 Unspecified abdominal pain: Secondary | ICD-10-CM | POA: Insufficient documentation

## 2011-05-21 HISTORY — DX: Tubal ligation status: Z98.51

## 2011-05-21 LAB — URINALYSIS, ROUTINE W REFLEX MICROSCOPIC
Bilirubin Urine: NEGATIVE
Ketones, ur: NEGATIVE mg/dL
Leukocytes, UA: NEGATIVE
Nitrite: NEGATIVE
Protein, ur: NEGATIVE mg/dL
Urobilinogen, UA: 0.2 mg/dL (ref 0.0–1.0)

## 2011-05-21 LAB — COMPREHENSIVE METABOLIC PANEL
Albumin: 3.6 g/dL (ref 3.5–5.2)
Alkaline Phosphatase: 80 U/L (ref 39–117)
BUN: 17 mg/dL (ref 6–23)
CO2: 24 mEq/L (ref 19–32)
Chloride: 105 mEq/L (ref 96–112)
Creatinine, Ser: 0.6 mg/dL (ref 0.50–1.10)
GFR calc Af Amer: >90 mL/min (ref >90)
GFR calc non Af Amer: >90 mL/min (ref >90)
Glucose, Bld: 102 mg/dL — ABNORMAL HIGH (ref 70–99)
Potassium: 4.1 mEq/L (ref 3.5–5.1)
Total Bilirubin: 0.1 mg/dL — ABNORMAL LOW (ref 0.3–1.2)

## 2011-05-21 LAB — DIFFERENTIAL
Lymphocytes Relative: 37 % (ref 12–46)
Lymphs Abs: 2.6 10*3/uL (ref 0.7–4.0)
Monocytes Absolute: 0.5 10*3/uL (ref 0.1–1.0)
Monocytes Relative: 7 % (ref 3–12)
Neutro Abs: 3.8 10*3/uL (ref 1.7–7.7)
Neutrophils Relative %: 53 % (ref 43–77)

## 2011-05-21 LAB — LIPASE, BLOOD: Lipase: 34 U/L (ref 11–59)

## 2011-05-21 LAB — CBC
HCT: 40.7 % (ref 36.0–46.0)
Hemoglobin: 13.4 g/dL (ref 12.0–15.0)
RBC: 4.36 MIL/uL (ref 3.87–5.11)

## 2011-05-21 MED ORDER — ONDANSETRON HCL 4 MG/2ML IJ SOLN
4.0000 mg | Freq: Once | INTRAMUSCULAR | Status: AC
  Administered 2011-05-21: 4 mg via INTRAVENOUS
  Filled 2011-05-21: qty 2

## 2011-05-21 MED ORDER — PROMETHAZINE HCL 25 MG/ML IJ SOLN
INTRAMUSCULAR | Status: AC
  Filled 2011-05-21: qty 1

## 2011-05-21 MED ORDER — FAMOTIDINE IN NACL 20-0.9 MG/50ML-% IV SOLN
20.0000 mg | Freq: Once | INTRAVENOUS | Status: AC
  Administered 2011-05-21 (×2): 20 mg via INTRAVENOUS
  Filled 2011-05-21: qty 50

## 2011-05-21 MED ORDER — PROMETHAZINE HCL 25 MG PO TABS: 25.0000 mg | ORAL_TABLET | Freq: Four times a day (QID) | ORAL | Status: DC | PRN

## 2011-05-21 MED ORDER — FAMOTIDINE 40 MG PO TABS: 20.0000 mg | ORAL_TABLET | Freq: Two times a day (BID) | ORAL | Status: DC | PRN

## 2011-05-21 MED ORDER — HYDROMORPHONE HCL PF 2 MG/ML IJ SOLN
2.0000 mg | Freq: Once | INTRAMUSCULAR | Status: AC
  Administered 2011-05-21: 2 mg via INTRAVENOUS
  Filled 2011-05-21: qty 1

## 2011-05-21 MED ORDER — SODIUM CHLORIDE 0.9 % IV SOLN: 999.0000 mL | INTRAVENOUS | Status: DC

## 2011-05-21 MED ORDER — HYDROCODONE-ACETAMINOPHEN 5-325 MG PO TABS: 2.0000 | ORAL_TABLET | ORAL | Status: AC | PRN

## 2011-05-21 MED ORDER — SODIUM CHLORIDE 0.9 % IV BOLUS (SEPSIS)
1000.0000 mL | Freq: Once | INTRAVENOUS | Status: AC
  Administered 2011-05-21: 1000 mL via INTRAVENOUS

## 2011-05-21 MED ORDER — PROMETHAZINE HCL 25 MG/ML IJ SOLN
12.5000 mg | Freq: Once | INTRAMUSCULAR | Status: AC
  Administered 2011-05-21: 12.5 mg via INTRAVENOUS

## 2011-05-21 NOTE — ED Notes (Signed)
Patient is resting comfortably.Returned from CT pain level decreased.

## 2011-05-21 NOTE — ED Notes (Signed)
Patient is resting comfortably.Tolerating PO fluids well care plan reviewed with pt and family

## 2011-05-21 NOTE — ED Notes (Signed)
Pt awakened with L sided abd pain with forceful nausea Vomiting

## 2011-05-21 NOTE — ED Notes (Signed)
Pt verbalizes care plan well use of meds for abd pain

## 2011-05-21 NOTE — ED Provider Notes (Addendum)
History     CSN: [id number] Arrival date & time: 05/21/2011  9:21 AM   None     Chief Complaint  Patient presents with  . Abdominal Pain    abd pain L sided flank    (Consider location/radiation/quality/duration/timing/severity/associated sxs/prior treatment) HPI Comments: The patient is a 30 year old female who presents for evaluation of left sided abdominal pain at the left upper and left lower quadrants as well as the left flank, that started abruptly, waking her up from sleep at approximately 2:00 this morning, with associated nausea and vomiting x5, with no blood in the emesis and no diarrhea. She reports her last bowel movement was yesterday and normal. She denies any vaginal bleeding or vaginal discharge. She does report some blood in her urine yesterday, but denies any previous history of kidney stones. She denies any dysuria, urinary urgency, urinary retention, or urinary problems otherwise. She has not had anything to eat since her symptoms started. Her symptoms have been constant, and she describes the pain as sharp and stabbing, without radiation. The symptoms have been present since 2:00 this morning. She reports that the pain is moderate to severe in intensity. She has not tried any treatment prior to presenting to the emergency department.  The history is provided by the patient.    Past Medical History  Diagnosis Date  . HPV (human papilloma virus) infection   . S/P tubal ligation     Past Surgical History  Procedure Date  . Cholecystectomy   . Cesarean section     History reviewed. No pertinent family history.  History  Substance Use Topics  . Smoking status: Never Smoker   . Smokeless tobacco: Not on file  . Alcohol Use: No    OB History    Grav Para Term Preterm Abortions TAB SAB Ect Mult Living   3 1 1              Review of Systems  Constitutional: Negative for fever, chills, diaphoresis, activity change, appetite change and fatigue.  HENT:  Negative for sore throat, neck pain and neck stiffness.   Respiratory: Negative.  Negative for cough and shortness of breath.   Cardiovascular: Negative.  Negative for chest pain.  Gastrointestinal: Positive for abdominal pain. Negative for nausea, vomiting, diarrhea, constipation, blood in stool, abdominal distention, anal bleeding and rectal pain.  Genitourinary: Positive for urgency, frequency and flank pain. Negative for dysuria, hematuria, decreased urine volume, vaginal bleeding, vaginal discharge, difficulty urinating, vaginal pain and pelvic pain.  Musculoskeletal: Positive for back pain. Negative for myalgias, joint swelling and gait problem.  Skin: Negative for color change and rash.  Neurological: Negative for weakness, numbness and headaches.  Hematological: Negative.   Psychiatric/Behavioral: Negative.     Allergies  Ibuprofen  Home Medications   Current Outpatient Rx  Name Route Sig Dispense Refill  . FLINTSTONES GUMMIES PO Oral Take 2 tablets by mouth daily.        BP 110/90  Pulse 99  Temp 97.6 F (36.4 C)  Resp 20  Wt 165 lb (74.844 kg)  SpO2 96%  LMP 04/09/2010  Breastfeeding? Unknown  Physical Exam  Nursing note and vitals reviewed. Constitutional: She is oriented to person, place, and time. She appears well-nourished. No distress.  HENT:  Head: Normocephalic and atraumatic.  Eyes: EOM are normal. Pupils are equal, round, and reactive to light.  Neck: Normal range of motion. Neck supple. No JVD present.  Cardiovascular: Normal rate, regular rhythm, normal heart sounds and intact  distal pulses.  Exam reveals no gallop and no friction rub.   No murmur heard. Pulmonary/Chest: Effort normal and breath sounds normal. No respiratory distress. She has no wheezes. She has no rales. She exhibits no tenderness.  Abdominal: Soft. Bowel sounds are normal. She exhibits no distension, no fluid wave, no ascites and no mass. There is tenderness in the left upper quadrant  and left lower quadrant. There is CVA tenderness. There is no rebound, no guarding, no tenderness at McBurney's point and negative Murphy's sign. No hernia.       Mild left CVA tenderness  Musculoskeletal: Normal range of motion. She exhibits no edema and no tenderness.  Neurological: She is alert and oriented to person, place, and time. She has normal reflexes. No cranial nerve deficit. She exhibits normal muscle tone. Coordination normal.  Skin: Skin is warm and dry. No rash noted. She is not diaphoretic. No erythema. No pallor.  Psychiatric: She has a normal mood and affect. Her behavior is normal. Judgment and thought content normal.    ED Course  Procedures (including critical care time)   Labs Reviewed  URINALYSIS, ROUTINE W REFLEX MICROSCOPIC  PREGNANCY, URINE  CBC  DIFFERENTIAL  COMPREHENSIVE METABOLIC PANEL  LIPASE, BLOOD   No results found.   No diagnosis found.    MDM  Gastritis, peptic ulcer disease, biliary colic, cholangitis, hepatitis, renal colic, urinary tract infection, colitis, gastroenteritis all considered among other etiologies in the patient's differential diagnosis.  A noncontrast CT scan to evaluate symptoms further will be obtained.         Felisa Bonier, MD 05/21/11 1308  Felisa Bonier, MD 05/21/11 1044

## 2011-06-05 ENCOUNTER — Emergency Department (HOSPITAL_BASED_OUTPATIENT_CLINIC_OR_DEPARTMENT_OTHER)
Admission: EM | Admit: 2011-06-05 | Discharge: 2011-06-05 | Disposition: A | Discharge status: Hospice | Payer: Medicaid Other | Attending: Emergency Medicine | Admitting: Emergency Medicine

## 2011-06-05 ENCOUNTER — Encounter (HOSPITAL_BASED_OUTPATIENT_CLINIC_OR_DEPARTMENT_OTHER): Payer: Self-pay | Admitting: Emergency Medicine

## 2011-06-05 DIAGNOSIS — K089 Disorder of teeth and supporting structures, unspecified: Secondary | ICD-10-CM | POA: Insufficient documentation

## 2011-06-05 DIAGNOSIS — K047 Periapical abscess without sinus: Secondary | ICD-10-CM | POA: Insufficient documentation

## 2011-06-05 MED ORDER — PENICILLIN V POTASSIUM 500 MG PO TABS: 500.0000 mg | ORAL_TABLET | Freq: Three times a day (TID) | ORAL | Status: AC

## 2011-06-05 MED ORDER — HYDROCODONE-ACETAMINOPHEN 5-325 MG PO TABS: 1.0000 | ORAL_TABLET | ORAL | Status: AC | PRN

## 2011-06-05 NOTE — ED Notes (Signed)
Pt reports dental extraction last week with increased pain and drainage x 24 hrs

## 2011-06-05 NOTE — ED Notes (Signed)
Care plan and safe use of meds reviewed

## 2011-06-05 NOTE — ED Provider Notes (Signed)
History     CSN: [id number] Arrival date & time: 06/05/2011 11:42 AM   First MD Initiated Contact with Patient 06/05/11 1154      Chief Complaint  Patient presents with  . Dental Pain    pt reports recent extraction with increased drainage and pain alast 24hrs    (Consider location/radiation/quality/duration/timing/severity/associated sxs/prior treatment) HPI Comments: Patient had a front upper tooth pulled one week ago by her dentist.  Initially she was doing well but about 3 days ago she started having increasing pain there.  She's had decreased ability to chew on that side and increased temperature sensitivity.  He's had increased swelling as well.  The pain does radiate up to her eyes to the right side patient already: contacted her dentist regarding followup this week.  She comes in to she's concerned she needs antibiotics.  Patient is a 30 y.o. female presenting with tooth pain. The history is provided by the patient. No language interpreter was used.  Dental PainThe primary symptoms include mouth pain and oral bleeding. Primary symptoms do not include headaches, fever, shortness of breath, sore throat, angioedema or cough. The symptoms began 2 days ago.    Past Medical History  Diagnosis Date  . HPV (human papilloma virus) infection   . S/P tubal ligation     Past Surgical History  Procedure Date  . Cholecystectomy   . Cesarean section     History reviewed. No pertinent family history.  History  Substance Use Topics  . Smoking status: Never Smoker   . Smokeless tobacco: Not on file  . Alcohol Use: No    OB History    Grav Para Term Preterm Abortions TAB SAB Ect Mult Living   3 1 1              Review of Systems  Constitutional: Negative.  Negative for fever and chills.  HENT: Positive for dental problem. Negative for sore throat.   Eyes: Negative.   Respiratory: Negative.  Negative for cough and shortness of breath.   Cardiovascular: Negative.  Negative for  chest pain.  Gastrointestinal: Negative.  Negative for nausea, vomiting, abdominal pain and diarrhea.  Genitourinary: Negative.   Musculoskeletal: Negative.  Negative for back pain.  Skin: Negative.  Negative for color change and rash.  Neurological: Negative.  Negative for syncope and headaches.  Hematological: Negative.  Negative for adenopathy.  Psychiatric/Behavioral: Negative.  Negative for confusion.  All other systems reviewed and are negative.    Allergies  Ibuprofen  Home Medications   Current Outpatient Rx  Name Route Sig Dispense Refill  . FAMOTIDINE 40 MG PO TABS Oral Take 0.5 tablets (20 mg total) by mouth 2 (two) times daily as needed for heartburn (upset stomach). 12 tablet 0  . FLINTSTONES GUMMIES PO Oral Take 2 tablets by mouth daily.        BP 125/90  Pulse 105  Temp(Src) 98.1 F (36.7 C) (Oral)  Resp 18  Ht 5\' 2"  (1.575 m)  Wt 165 lb (74.844 kg)  BMI 30.18 kg/m2  SpO2 100%  LMP 04/09/2010  Physical Exam  Constitutional: She is oriented to person, place, and time. She appears well-developed and well-nourished.  HENT:  Head: Normocephalic and atraumatic.       And patient's front right canine tooth is extracted and the site does have some mild swelling and erythema around it.  The area is tender.  There is no actual pus drainage at this time.  Patient's face is symmetric.  Eyes: Conjunctivae and EOM are normal. Pupils are equal, round, and reactive to light.  Neck: Normal range of motion. Neck supple.  Pulmonary/Chest: Effort normal.  Musculoskeletal: Normal range of motion.  Lymphadenopathy:    She has no cervical adenopathy.  Neurological: She is alert and oriented to person, place, and time.  Skin: Skin is warm and dry.  Psychiatric: She has a normal mood and affect. Her behavior is normal. Judgment and thought content normal.    ED Course  Procedures (including critical care time)  Labs Reviewed - No data to display No results found.   No  diagnosis found.    MDM  Patient with likely early dental infection at site of tooth extraction.  I will place her on penicillin and give her a small amount of Vicodin to be used at home over the next few days.  She has already contacted her dentist regarding followup for this week and knows the importance of doing so.        Nat Christen, MD 06/05/11 [phone]

## 2011-06-05 NOTE — ED Notes (Signed)
Pt denies any antibiotic therapy  Or pain controll

## 2011-07-14 ENCOUNTER — Emergency Department (INDEPENDENT_AMBULATORY_CARE_PROVIDER_SITE_OTHER): Payer: Medicaid Other

## 2011-07-14 ENCOUNTER — Encounter (HOSPITAL_BASED_OUTPATIENT_CLINIC_OR_DEPARTMENT_OTHER): Payer: Self-pay

## 2011-07-14 ENCOUNTER — Emergency Department (HOSPITAL_BASED_OUTPATIENT_CLINIC_OR_DEPARTMENT_OTHER)
Admission: EM | Admit: 2011-07-14 | Discharge: 2011-07-14 | Disposition: A | Discharge status: Home / Self Care | Payer: Medicaid Other | Attending: Emergency Medicine | Admitting: Emergency Medicine

## 2011-07-14 DIAGNOSIS — N898 Other specified noninflammatory disorders of vagina: Secondary | ICD-10-CM

## 2011-07-14 DIAGNOSIS — N939 Abnormal uterine and vaginal bleeding, unspecified: Secondary | ICD-10-CM

## 2011-07-14 DIAGNOSIS — R1031 Right lower quadrant pain: Secondary | ICD-10-CM

## 2011-07-14 DIAGNOSIS — F17200 Nicotine dependence, unspecified, uncomplicated: Secondary | ICD-10-CM | POA: Insufficient documentation

## 2011-07-14 DIAGNOSIS — R109 Unspecified abdominal pain: Secondary | ICD-10-CM | POA: Insufficient documentation

## 2011-07-14 DIAGNOSIS — N949 Unspecified condition associated with female genital organs and menstrual cycle: Secondary | ICD-10-CM

## 2011-07-14 LAB — COMPREHENSIVE METABOLIC PANEL
AST: 13 U/L (ref 0–37)
Albumin: 4.2 g/dL (ref 3.5–5.2)
Calcium: 9.6 mg/dL (ref 8.4–10.5)
Chloride: 104 mEq/L (ref 96–112)
Creatinine, Ser: 0.7 mg/dL (ref 0.50–1.10)
Sodium: 141 mEq/L (ref 135–145)

## 2011-07-14 LAB — URINALYSIS, ROUTINE W REFLEX MICROSCOPIC
Glucose, UA: NEGATIVE mg/dL
pH: 5.5 (ref 5.0–8.0)

## 2011-07-14 LAB — WET PREP, GENITAL
Trich, Wet Prep: NONE SEEN
WBC, Wet Prep HPF POC: NONE SEEN
Yeast Wet Prep HPF POC: NONE SEEN

## 2011-07-14 LAB — CBC
HCT: 41.9 % (ref 36.0–46.0)
MCHC: 33.7 g/dL (ref 30.0–36.0)
Platelets: 363 10*3/uL (ref 150–400)
RDW: 14.9 % (ref 11.5–15.5)

## 2011-07-14 LAB — DIFFERENTIAL
Basophils Absolute: 0 10*3/uL (ref 0.0–0.1)
Basophils Relative: 0 % (ref 0–1)
Monocytes Absolute: 0.5 10*3/uL (ref 0.1–1.0)
Neutro Abs: 4.6 10*3/uL (ref 1.7–7.7)

## 2011-07-14 LAB — URINE MICROSCOPIC-ADD ON

## 2011-07-14 MED ORDER — HYDROMORPHONE HCL PF 1 MG/ML IJ SOLN
0.5000 mg | Freq: Once | INTRAMUSCULAR | Status: AC
  Administered 2011-07-14: 0.5 mg via INTRAVENOUS
  Filled 2011-07-14: qty 1

## 2011-07-14 MED ORDER — ONDANSETRON HCL 4 MG/2ML IJ SOLN
4.0000 mg | Freq: Once | INTRAMUSCULAR | Status: AC
  Administered 2011-07-14: 4 mg via INTRAVENOUS
  Filled 2011-07-14: qty 2

## 2011-07-14 MED ORDER — HYDROCODONE-ACETAMINOPHEN 5-325 MG PO TABS: 1.0000 | ORAL_TABLET | Freq: Four times a day (QID) | ORAL | Status: AC | PRN

## 2011-07-14 MED ORDER — IOHEXOL 300 MG/ML  SOLN
100.0000 mL | Freq: Once | INTRAMUSCULAR | Status: AC | PRN
  Administered 2011-07-14: 100 mL via INTRAVENOUS

## 2011-07-14 MED ORDER — ONDANSETRON 8 MG PO TBDP: 8.0000 mg | ORAL_TABLET | Freq: Three times a day (TID) | ORAL | Status: AC | PRN

## 2011-07-14 MED ORDER — FENTANYL CITRATE 0.05 MG/ML IJ SOLN
100.0000 ug | Freq: Once | INTRAMUSCULAR | Status: AC
  Administered 2011-07-14: 100 ug via INTRAVENOUS
  Filled 2011-07-14: qty 2

## 2011-07-14 MED ORDER — IOHEXOL 300 MG/ML  SOLN
20.0000 mL | Freq: Once | INTRAMUSCULAR | Status: AC | PRN
  Administered 2011-07-14: 20 mL via ORAL

## 2011-07-14 MED ORDER — SODIUM CHLORIDE 0.9 % IV BOLUS (SEPSIS)
1000.0000 mL | Freq: Once | INTRAVENOUS | Status: AC
  Administered 2011-07-14: 1000 mL via INTRAVENOUS

## 2011-07-14 NOTE — ED Notes (Signed)
Pt. Is drinking contrast for CT scan.  Pt. Sitting up in bed with no distress noted.

## 2011-07-14 NOTE — ED Provider Notes (Signed)
History     CSN: [id number]  Arrival date & time 07/14/11  1614   First MD Initiated Contact with Patient 07/14/11 1627     5:06 PM HPI Patient reports vaginal bleeding that began 3 days ago. Reports is abnormal for her since her last menstrual cycle was 06/27/2010. Bleeding associated with  lower abdominal pain denies diarrhea, nausea, vomiting, fever, back pain. Reports bleeding is heavier than normal menstrual cycle. States she's using 2 pads an hour. Reports a history of ovarian cyst years ago. States pain feels somewhat similar to Patient is a 31 y.o. female presenting with vaginal bleeding and abdominal pain. The history is provided by the patient.  Vaginal Bleeding This is a new problem. Episode onset: 3 days ago. The problem has been gradually worsening. Associated symptoms include abdominal pain. Pertinent negatives include no chest pain, chills, fever, nausea, urinary symptoms, vomiting or weakness. Associated symptoms comments: Dizziness. The symptoms are aggravated by nothing. She has tried nothing for the symptoms.  Abdominal Pain The primary symptoms of the illness include abdominal pain and vaginal bleeding. The primary symptoms of the illness do not include fever, shortness of breath, nausea, vomiting, diarrhea, dysuria or vaginal discharge. Episode onset: 3 days ago. The onset of the illness was gradual. The problem has been gradually worsening.  The abdominal pain is located in the LLQ, RLQ and suprapubic region. The abdominal pain does not radiate. The abdominal pain is relieved by nothing.  Vaginal bleeding was first noticed 2 days ago. Vaginal bleeding other than menses is a new problem. The quantity of blood was heavier than menses (2 pads an hour).  The patient states that she believes she is currently not pregnant. The patient has not had a change in bowel habit. Symptoms associated with the illness do not include chills, constipation, urgency, hematuria, frequency or back  pain.    Past Medical History  Diagnosis Date  . HPV (human papilloma virus) infection   . S/P tubal ligation     Past Surgical History  Procedure Date  . Cholecystectomy   . Cesarean section     No family history on file.  History  Substance Use Topics  . Smoking status: Current Everyday Smoker -- 0.5 packs/day  . Smokeless tobacco: Not on file  . Alcohol Use: No    OB History    Grav Para Term Preterm Abortions TAB SAB Ect Mult Living   3 1 1              Review of Systems  Constitutional: Negative for fever and chills.  Respiratory: Negative for shortness of breath.   Cardiovascular: Negative for chest pain.  Gastrointestinal: Positive for abdominal pain. Negative for nausea, vomiting, diarrhea and constipation.  Genitourinary: Positive for vaginal bleeding. Negative for dysuria, urgency, frequency, hematuria, flank pain, vaginal discharge and vaginal pain.  Musculoskeletal: Negative for back pain.  Neurological: Negative for weakness.  All other systems reviewed and are negative.    Allergies  Ibuprofen and Nsaids  Home Medications   Current Outpatient Rx  Name Route Sig Dispense Refill  . ACETAMINOPHEN 500 MG PO TABS Oral Take 2,000 mg by mouth every 6 (six) hours as needed. For pain      BP 124/92  Pulse 72  Temp(Src) 98.4 F (36.9 C) (Oral)  Resp 20  Ht 5\' 2"  (1.575 m)  Wt 165 lb (74.844 kg)  BMI 30.18 kg/m2  SpO2 100%  LMP 07/14/2011  Physical Exam  Vitals reviewed. Constitutional: She is  oriented to person, place, and time. Vital signs are normal. She appears well-developed and well-nourished.  HENT:  Head: Normocephalic and atraumatic.  Eyes: Conjunctivae are normal. Pupils are equal, round, and reactive to light.  Neck: Normal range of motion. Neck supple.  Cardiovascular: Normal rate, regular rhythm and normal heart sounds.  Exam reveals no friction rub.   No murmur heard. Pulmonary/Chest: Effort normal and breath sounds normal. She  has no wheezes. She has no rhonchi. She has no rales. She exhibits no tenderness.  Abdominal: There is no hepatosplenomegaly. There is tenderness in the right lower quadrant. There is no rigidity, no rebound, no guarding, no CVA tenderness, no tenderness at McBurney's point and negative Murphy's sign.  Genitourinary: Uterus normal. There is no tenderness on the right labia. There is no tenderness on the left labia. Cervix exhibits no motion tenderness. Right adnexum displays tenderness. Right adnexum displays no mass and no fullness. Left adnexum displays no mass, no tenderness and no fullness. There is bleeding around the vagina. No tenderness around the vagina.  Musculoskeletal: Normal range of motion.  Neurological: She is alert and oriented to person, place, and time. Coordination normal.  Skin: Skin is warm and dry. No rash noted. No erythema. No pallor.    ED Course  Procedures   Results for orders placed during the hospital encounter of 07/14/11  CBC      Component Value Range   WBC 8.8  4.0 - 10.5 (K/uL)   RBC 4.62  3.87 - 5.11 (MIL/uL)   Hemoglobin 14.1  12.0 - 15.0 (g/dL)   HCT 16.1  09.6 - 04.5 (%)   MCV 90.7  78.0 - 100.0 (fL)   MCH 30.5  26.0 - 34.0 (pg)   MCHC 33.7  30.0 - 36.0 (g/dL)   RDW 40.9  81.1 - 91.4 (%)   Platelets 363  150 - 400 (K/uL)  DIFFERENTIAL      Component Value Range   Neutrophils Relative 53  43 - 77 (%)   Neutro Abs 4.6  1.7 - 7.7 (K/uL)   Lymphocytes Relative 40  12 - 46 (%)   Lymphs Abs 3.6  0.7 - 4.0 (K/uL)   Monocytes Relative 5  3 - 12 (%)   Monocytes Absolute 0.5  0.1 - 1.0 (K/uL)   Eosinophils Relative 2  0 - 5 (%)   Eosinophils Absolute 0.2  0.0 - 0.7 (K/uL)   Basophils Relative 0  0 - 1 (%)   Basophils Absolute 0.0  0.0 - 0.1 (K/uL)  COMPREHENSIVE METABOLIC PANEL      Component Value Range   Sodium 141  135 - 145 (mEq/L)   Potassium 3.0 (*) 3.5 - 5.1 (mEq/L)   Chloride 104  96 - 112 (mEq/L)   CO2 23  19 - 32 (mEq/L)   Glucose, Bld  87  70 - 99 (mg/dL)   BUN 12  6 - 23 (mg/dL)   Creatinine, Ser 7.82  0.50 - 1.10 (mg/dL)   Calcium 9.6  8.4 - 95.6 (mg/dL)   Total Protein 7.3  6.0 - 8.3 (g/dL)   Albumin 4.2  3.5 - 5.2 (g/dL)   AST 13  0 - 37 (U/L)   ALT 13  0 - 35 (U/L)   Alkaline Phosphatase 80  39 - 117 (U/L)   Total Bilirubin 0.2 (*) 0.3 - 1.2 (mg/dL)   GFR calc non Af Amer >90  >90 (mL/min)   GFR calc Af Amer >90  >90 (mL/min)  LIPASE, BLOOD      Component Value Range   Lipase 25  11 - 59 (U/L)  URINALYSIS, ROUTINE W REFLEX MICROSCOPIC      Component Value Range   Color, Urine PINK (*) YELLOW    APPearance CLOUDY (*) CLEAR    Specific Gravity, Urine 1.017  1.005 - 1.030    pH 5.5  5.0 - 8.0    Glucose, UA NEGATIVE  NEGATIVE (mg/dL)   Hgb urine dipstick LARGE (*) NEGATIVE    Bilirubin Urine NEGATIVE  NEGATIVE    Ketones, ur NEGATIVE  NEGATIVE (mg/dL)   Protein, ur 30 (*) NEGATIVE (mg/dL)   Urobilinogen, UA 0.2  0.0 - 1.0 (mg/dL)   Nitrite NEGATIVE  NEGATIVE    Leukocytes, UA SMALL (*) NEGATIVE   WET PREP, GENITAL      Component Value Range   Yeast, Wet Prep NONE SEEN  NONE SEEN    Trich, Wet Prep NONE SEEN  NONE SEEN    Clue Cells, Wet Prep FEW (*) NONE SEEN    WBC, Wet Prep HPF POC NONE SEEN  NONE SEEN   PREGNANCY, URINE      Component Value Range   Preg Test, Ur NEGATIVE    URINE MICROSCOPIC-ADD ON      Component Value Range   Squamous Epithelial / LPF FEW (*) RARE    WBC, UA 3-6  <3 (WBC/hpf)   RBC / HPF 7-10  <3 (RBC/hpf)   Bacteria, UA MANY (*) RARE    Ct Abdomen Pelvis W Contrast  07/14/2011  *RADIOLOGY REPORT*  Clinical Data: 31 year old female with right-sided abdominal and pelvic pain and vaginal bleeding.  CT ABDOMEN AND PELVIS WITH CONTRAST  Technique:  Multidetector CT imaging of the abdomen and pelvis was performed following the standard protocol during bolus administration of intravenous contrast.  Contrast: OMNIPAQUE IOHEXOL 300 MG/ML IV SOLN  Comparison: None  Findings: Lung  bases are clear.  The liver is unremarkable except for two tiny upper hepatic cysts. The spleen, adrenal glands, pancreas and kidneys are unremarkable. Cholecystectomy is noted.  No free fluid, enlarged lymph nodes, biliary dilation or abdominal aortic aneurysm identified.  The bowel, appendix and bladder are unremarkable. The uterus and adnexal regions are within normal limits. No acute or suspicious bony abnormalities are identified.  IMPRESSION: No acute abnormalities.  Uterus and adnexal regions are within normal limits.  Normal appendix.  Original Report Authenticated By: Rosendo Gros, M.D.       MDM    Patient has significant right lower quadrant tenderness suspect a ruptured hemorrhagic ovarian cysts. Ultrasound has already a left for today.Will order CT scan to rule out appendicitis versus acute ovarian pathology. Patient requests more pain medication.  8:35 PM CT scans and labs not significant. Suspect patient likely had a ruptured ovarian cyst or just pelvic pain for vaginal bleeding. Advised followup with OB/GYN for further evaluation of abnormal vaginal bleeding. Discharge patient with pain medication.  Thomasene Lot, PA-C 07/14/11 2035

## 2011-07-14 NOTE — ED Notes (Signed)
Pt reports onset of vaginal bleeding and abdominal pain 3 days ago and worsening today.

## 2011-07-14 NOTE — ED Notes (Signed)
Pt. Asking for more pain meds.  PA C made aware of Pt. Wants.

## 2011-07-14 NOTE — ED Notes (Signed)
Pt. Has finished contrast and Radiology aware.

## 2011-07-17 NOTE — ED Provider Notes (Signed)
Medical screening examination/treatment/procedure(s) were performed by non-physician practitioner and as supervising physician I was immediately available for consultation/collaboration.  Velvie Thomaston T Earnie Rockhold, MD 07/17/11 0752 

## 2011-08-24 ENCOUNTER — Emergency Department (HOSPITAL_COMMUNITY)
Admission: EM | Admit: 2011-08-24 | Discharge: 2011-08-24 | Disposition: A | Discharge status: Home Health | Payer: Medicaid Other | Attending: Emergency Medicine | Admitting: Emergency Medicine

## 2011-08-24 ENCOUNTER — Emergency Department (HOSPITAL_COMMUNITY): Payer: Medicaid Other

## 2011-08-24 ENCOUNTER — Encounter (HOSPITAL_COMMUNITY): Payer: Self-pay | Admitting: Emergency Medicine

## 2011-08-24 DIAGNOSIS — M545 Low back pain, unspecified: Secondary | ICD-10-CM | POA: Insufficient documentation

## 2011-08-24 DIAGNOSIS — IMO0002 Reserved for concepts with insufficient information to code with codable children: Secondary | ICD-10-CM | POA: Insufficient documentation

## 2011-08-24 DIAGNOSIS — F17200 Nicotine dependence, unspecified, uncomplicated: Secondary | ICD-10-CM | POA: Insufficient documentation

## 2011-08-24 DIAGNOSIS — S3992XA Unspecified injury of lower back, initial encounter: Secondary | ICD-10-CM

## 2011-08-24 DIAGNOSIS — W108XXA Fall (on) (from) other stairs and steps, initial encounter: Secondary | ICD-10-CM | POA: Insufficient documentation

## 2011-08-24 DIAGNOSIS — Y93K1 Activity, walking an animal: Secondary | ICD-10-CM | POA: Insufficient documentation

## 2011-08-24 DIAGNOSIS — M549 Dorsalgia, unspecified: Secondary | ICD-10-CM | POA: Insufficient documentation

## 2011-08-24 MED ORDER — TRAMADOL HCL 50 MG PO TABS: 50.0000 mg | ORAL_TABLET | Freq: Four times a day (QID) | ORAL | Status: DC | PRN

## 2011-08-24 MED ORDER — DIAZEPAM 5 MG PO TABS
5.0000 mg | ORAL_TABLET | Freq: Once | ORAL | Status: AC
  Administered 2011-08-24: 5 mg via ORAL
  Filled 2011-08-24: qty 1

## 2011-08-24 MED ORDER — OXYCODONE-ACETAMINOPHEN 5-325 MG PO TABS
1.0000 | ORAL_TABLET | Freq: Once | ORAL | Status: AC
  Administered 2011-08-24: 1 via ORAL
  Filled 2011-08-24: qty 1

## 2011-08-24 NOTE — ED Provider Notes (Signed)
Medical screening examination/treatment/procedure(s) were performed by non-physician practitioner and as supervising physician I was immediately available for consultation/collaboration.  Ethelda Chick, MD 08/24/11 [phone]

## 2011-08-24 NOTE — ED Notes (Signed)
Pt requesting narcotic pain medication, staes she came here for help and wants a RX for narcotics. Greta Doom, PA informed pt of reasons narcotics are not necessary at this time and pt follow up information.

## 2011-08-24 NOTE — ED Notes (Signed)
Pt presenting to ed with c/o walking her mother's great dane and the dog pulled her down 3 steps and no she is having back pain that's radiating into both of her legs. Pt states she can walk but its painful

## 2011-08-24 NOTE — Discharge Instructions (Signed)

## 2011-08-24 NOTE — ED Provider Notes (Signed)
History     CSN: [id number]  Arrival date & time 08/24/11  1116   First MD Initiated Contact with Patient 08/24/11 1130      Chief Complaint  Patient presents with  . Back Pain    (Consider location/radiation/quality/duration/timing/severity/associated sxs/prior treatment) HPI  31 year old female presenting to the ED with chief complaints of low back pain. Patient states she was walking her mother's great dane when the dog pulled her down several steps of stair.  Patient states she landed directly on the mid back but denies hitting her head or any loss of consciousness. She is complaining of pain to her mid and low back. She denies any other injury. Patient sts she is able to walk but with increasing back pain. She denies any numbness, weakness, or radiating pain. This event happened one hour ago.  Past Medical History  Diagnosis Date  . HPV (human papilloma virus) infection   . S/P tubal ligation     Past Surgical History  Procedure Date  . Cholecystectomy   . Cesarean section     No family history on file.  History  Substance Use Topics  . Smoking status: Current Everyday Smoker -- 0.5 packs/day  . Smokeless tobacco: Not on file  . Alcohol Use: No    OB History    Grav Para Term Preterm Abortions TAB SAB Ect Mult Living   3 1 1              Review of Systems  All other systems reviewed and are negative.    Allergies  Ibuprofen and Nsaids  Home Medications   Current Outpatient Rx  Name Route Sig Dispense Refill  . ACETAMINOPHEN 500 MG PO TABS Oral Take 2,000 mg by mouth every 6 (six) hours as needed. For pain      BP 140/101  Pulse 90  Temp(Src) 98.1 F (36.7 C) (Oral)  Resp 18  SpO2 100%  LMP 07/29/2011  Physical Exam  Nursing note and vitals reviewed. Constitutional: She appears well-developed and well-nourished.  HENT:  Head: Normocephalic and atraumatic.  Neck: Neck supple.  Cardiovascular: Normal rate and regular rhythm.     Pulmonary/Chest: Effort normal. No respiratory distress. She exhibits no tenderness.  Abdominal: Soft.  Musculoskeletal:       Cervical back: Normal.       Thoracic back: She exhibits decreased range of motion, tenderness and bony tenderness. She exhibits no swelling.       Lumbar back: She exhibits decreased range of motion and tenderness. She exhibits no bony tenderness, no swelling and no edema.  Skin:       ED Course  Procedures (including critical care time)  Labs Reviewed - No data to display No results found.   No diagnosis found.  Dg Thoracic Spine 2 View  08/24/2011  *RADIOLOGY REPORT*  Clinical Data: Fall, upper back pain.  THORACIC SPINE - 2 VIEW  Comparison: 10/03/2008  Findings: No acute bony abnormality.  Specifically, no fracture or malalignment.  No significant degenerative disease.  IMPRESSION: Normal study.  Original Report Authenticated By: Cyndie Chime, M.D.   Dg Lumbar Spine Complete  08/24/2011  *RADIOLOGY REPORT*  Clinical Data: Fall, low back pain.  LUMBAR SPINE - COMPLETE 4+ VIEW  Comparison: CT 07/14/2011  Findings: There are five lumbar-type vertebral bodies.  No fracture or malalignment.  Disc spaces well maintained.  SI joints are symmetric.  IMPRESSION: Normal study.  Original Report Authenticated By: Cyndie Chime, M.D.  MDM  Mid back injury from fall.  Will obtain Xray for further evaluation.    12:18 PM Xray shows no acute fracture or dislocation.  Pt is allergic to NSAIDs.  Ultram given for pain.  F/u instruction given.        Fayrene Helper, PA-C 08/24/11 1220  12:26 PM Pt sts she develops hives with Ultram.  Therefore i recommend taking OTC acetaminophen.  Pt request narcotic pain medication, i spend a moderate amount of time discuss the risk of taking narcotic pain medication, including respiratory depress, dependency, or death and explain that it is not appropriate for pain without evidence of fracture.  I recommend pt f/u with  specialist if her pain worsen.    Fayrene Helper, PA-C 08/24/11 1228

## 2011-08-27 ENCOUNTER — Emergency Department (HOSPITAL_BASED_OUTPATIENT_CLINIC_OR_DEPARTMENT_OTHER): Payer: Medicaid Other

## 2011-08-27 ENCOUNTER — Emergency Department (HOSPITAL_BASED_OUTPATIENT_CLINIC_OR_DEPARTMENT_OTHER)
Admission: EM | Admit: 2011-08-27 | Discharge: 2011-08-27 | Discharge status: Against Medical Advice | Payer: Medicaid Other | Attending: Emergency Medicine | Admitting: Emergency Medicine

## 2011-08-27 ENCOUNTER — Encounter (HOSPITAL_BASED_OUTPATIENT_CLINIC_OR_DEPARTMENT_OTHER): Payer: Self-pay | Admitting: *Deleted

## 2011-08-27 DIAGNOSIS — M549 Dorsalgia, unspecified: Secondary | ICD-10-CM

## 2011-08-27 DIAGNOSIS — R51 Headache: Secondary | ICD-10-CM | POA: Insufficient documentation

## 2011-08-27 DIAGNOSIS — T148XXA Other injury of unspecified body region, initial encounter: Secondary | ICD-10-CM | POA: Insufficient documentation

## 2011-08-27 DIAGNOSIS — M79609 Pain in unspecified limb: Secondary | ICD-10-CM | POA: Insufficient documentation
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
## 2011-08-27 NOTE — ED Provider Notes (Signed)
Medical screening examination/treatment/procedure(s) were performed by non-physician practitioner and as supervising physician I was immediately available for consultation/collaboration.   Dayton Bailiff, MD 08/27/11 2017

## 2011-08-27 NOTE — ED Notes (Signed)
Explained to patient that the provider would perform the xray prior to giving pain medications and patient stated that she wanted to go home and would come back tomorrow if she still hurt, stated she didn't want xrays but wanted some pain medication and would prefer to leave

## 2011-09-17 ENCOUNTER — Emergency Department (INDEPENDENT_AMBULATORY_CARE_PROVIDER_SITE_OTHER): Payer: Medicaid Other

## 2011-09-17 ENCOUNTER — Emergency Department (HOSPITAL_BASED_OUTPATIENT_CLINIC_OR_DEPARTMENT_OTHER)
Admission: EM | Admit: 2011-09-17 | Discharge: 2011-09-17 | Disposition: A | Discharge status: Home / Self Care | Payer: Medicaid Other | Attending: Emergency Medicine | Admitting: Emergency Medicine

## 2011-09-17 ENCOUNTER — Encounter (HOSPITAL_BASED_OUTPATIENT_CLINIC_OR_DEPARTMENT_OTHER): Payer: Self-pay

## 2011-09-17 DIAGNOSIS — R109 Unspecified abdominal pain: Secondary | ICD-10-CM

## 2011-09-17 DIAGNOSIS — N760 Acute vaginitis: Secondary | ICD-10-CM | POA: Insufficient documentation

## 2011-09-17 DIAGNOSIS — R110 Nausea: Secondary | ICD-10-CM

## 2011-09-17 DIAGNOSIS — N949 Unspecified condition associated with female genital organs and menstrual cycle: Secondary | ICD-10-CM | POA: Insufficient documentation

## 2011-09-17 DIAGNOSIS — R197 Diarrhea, unspecified: Secondary | ICD-10-CM

## 2011-09-17 DIAGNOSIS — N83209 Unspecified ovarian cyst, unspecified side: Secondary | ICD-10-CM

## 2011-09-17 DIAGNOSIS — B9689 Other specified bacterial agents as the cause of diseases classified elsewhere: Secondary | ICD-10-CM | POA: Insufficient documentation

## 2011-09-17 DIAGNOSIS — A499 Bacterial infection, unspecified: Secondary | ICD-10-CM | POA: Insufficient documentation

## 2011-09-17 LAB — COMPREHENSIVE METABOLIC PANEL
ALT: 12 U/L (ref 0–35)
AST: 10 U/L (ref 0–37)
Albumin: 4 g/dL (ref 3.5–5.2)
Calcium: 9.6 mg/dL (ref 8.4–10.5)
Chloride: 100 mEq/L (ref 96–112)
Creatinine, Ser: 0.8 mg/dL (ref 0.50–1.10)
Sodium: 137 mEq/L (ref 135–145)
Total Bilirubin: 0.3 mg/dL (ref 0.3–1.2)

## 2011-09-17 LAB — CBC
MCV: 93.2 fL (ref 78.0–100.0)
Platelets: 300 10*3/uL (ref 150–400)
RBC: 4.58 MIL/uL (ref 3.87–5.11)
RDW: 15.6 % — ABNORMAL HIGH (ref 11.5–15.5)
WBC: 7.6 10*3/uL (ref 4.0–10.5)

## 2011-09-17 LAB — URINALYSIS, ROUTINE W REFLEX MICROSCOPIC
Nitrite: NEGATIVE
Specific Gravity, Urine: 1.034 — ABNORMAL HIGH (ref 1.005–1.030)
Urobilinogen, UA: 0.2 mg/dL (ref 0.0–1.0)
pH: 6 (ref 5.0–8.0)

## 2011-09-17 LAB — URINE MICROSCOPIC-ADD ON

## 2011-09-17 LAB — WET PREP, GENITAL: Yeast Wet Prep HPF POC: NONE SEEN

## 2011-09-17 MED ORDER — SODIUM CHLORIDE 0.9 % IV SOLN
Freq: Once | INTRAVENOUS | Status: AC
  Administered 2011-09-17: 17:00:00 via INTRAVENOUS

## 2011-09-17 MED ORDER — METRONIDAZOLE 500 MG PO TABS: 500.0000 mg | ORAL_TABLET | Freq: Two times a day (BID) | ORAL | Status: DC

## 2011-09-17 MED ORDER — IOHEXOL 300 MG/ML  SOLN
100.0000 mL | Freq: Once | INTRAMUSCULAR | Status: AC | PRN
  Administered 2011-09-17: 100 mL via INTRAVENOUS

## 2011-09-17 MED ORDER — ONDANSETRON HCL 4 MG/2ML IJ SOLN
4.0000 mg | Freq: Once | INTRAMUSCULAR | Status: AC
  Administered 2011-09-17: 4 mg via INTRAVENOUS
  Filled 2011-09-17: qty 2

## 2011-09-17 MED ORDER — MORPHINE SULFATE 4 MG/ML IJ SOLN
4.0000 mg | Freq: Once | INTRAMUSCULAR | Status: AC
  Administered 2011-09-17: 4 mg via INTRAMUSCULAR
  Filled 2011-09-17: qty 1

## 2011-09-17 MED ORDER — MORPHINE SULFATE 4 MG/ML IJ SOLN
4.0000 mg | Freq: Once | INTRAMUSCULAR | Status: DC
  Filled 2011-09-17: qty 1

## 2011-09-17 MED ORDER — IOHEXOL 300 MG/ML  SOLN
20.0000 mL | INTRAMUSCULAR | Status: AC
  Administered 2011-09-17 (×2): 20 mL via ORAL

## 2011-09-17 MED ORDER — HYDROCODONE-ACETAMINOPHEN 5-500 MG PO TABS: 1.0000 | ORAL_TABLET | Freq: Four times a day (QID) | ORAL | Status: AC | PRN

## 2011-09-17 MED ORDER — MORPHINE SULFATE 4 MG/ML IJ SOLN
4.0000 mg | Freq: Once | INTRAMUSCULAR | Status: AC
  Administered 2011-09-17: 4 mg via INTRAVENOUS
  Filled 2011-09-17: qty 1

## 2011-09-17 MED ORDER — MORPHINE SULFATE 4 MG/ML IJ SOLN
4.0000 mg | Freq: Once | INTRAMUSCULAR | Status: AC
  Administered 2011-09-17: 4 mg via INTRAMUSCULAR

## 2011-09-17 NOTE — Discharge Instructions (Signed)
Bacterial Vaginosis Bacterial vaginosis (BV) is a vaginal infection where the normal balance of bacteria in the vagina is disrupted. The normal balance is then replaced by an overgrowth of certain bacteria. There are several different kinds of bacteria that can cause BV. BV is the most common vaginal infection in women of childbearing age. CAUSES   The cause of BV is not fully understood. BV develops when there is an increase or imbalance of harmful bacteria.   Some activities or behaviors can upset the normal balance of bacteria in the vagina and put women at increased risk including:   Having a new sex partner or multiple sex partners.   Douching.   Using an intrauterine device (IUD) for contraception.   It is not clear what role sexual activity plays in the development of BV. However, women that have never had sexual intercourse are rarely infected with BV.  Women do not get BV from toilet seats, bedding, swimming pools or from touching objects around them.  SYMPTOMS   Grey vaginal discharge.   A fish-like odor with discharge, especially after sexual intercourse.   Itching or burning of the vagina and vulva.   Burning or pain with urination.   Some women have no signs or symptoms at all.  DIAGNOSIS  Your caregiver must examine the vagina for signs of BV. Your caregiver will perform lab tests and look at the sample of vaginal fluid through a microscope. They will look for bacteria and abnormal cells (clue cells), a pH test higher than 4.5, and a positive amine test all associated with BV.  RISKS AND COMPLICATIONS   Pelvic inflammatory disease (PID).   Infections following gynecology surgery.   Developing HIV.   Developing herpes virus.  TREATMENT  Sometimes BV will clear up without treatment. However, all women with symptoms of BV should be treated to avoid complications, especially if gynecology surgery is planned. Female partners generally do not need to be treated. However,  BV may spread between female sex partners so treatment is helpful in preventing a recurrence of BV.   BV may be treated with antibiotics. The antibiotics come in either pill or vaginal cream forms. Either can be used with nonpregnant or pregnant women, but the recommended dosages differ. These antibiotics are not harmful to the baby.   BV can recur after treatment. If this happens, a second round of antibiotics will often be prescribed.   Treatment is important for pregnant women. If not treated, BV can cause a premature delivery, especially for a pregnant woman who had a premature birth in the past. All pregnant women who have symptoms of BV should be checked and treated.   For chronic reoccurrence of BV, treatment with a type of prescribed gel vaginally twice a week is helpful.  HOME CARE INSTRUCTIONS   Finish all medication as directed by your caregiver.   Do not have sex until treatment is completed.   Tell your sexual partner that you have a vaginal infection. They should see their caregiver and be treated if they have problems, such as a mild rash or itching.   Practice safe sex. Use condoms. Only have 1 sex partner.  PREVENTION  Basic prevention steps can help reduce the risk of upsetting the natural balance of bacteria in the vagina and developing BV:  Do not have sexual intercourse (be abstinent).   Do not douche.   Use all of the medicine prescribed for treatment of BV, even if the signs and symptoms go away.     Tell your sex partner if you have BV. That way, they can be treated, if needed, to prevent reoccurrence.  SEEK MEDICAL CARE IF:   Your symptoms are not improving after 3 days of treatment.   You have increased discharge, pain, or fever.  MAKE SURE YOU:   Understand these instructions.   Will watch your condition.   Will get help right away if you are not doing well or get worse.  FOR MORE INFORMATION  Division of STD Prevention (DSTDP), Centers for Disease  Control and Prevention: www.cdc.gov/std [employer] Association (ASHA): www.ashastd.org  Document Released: 06/13/2005 Document Revised: 06/02/2011 Document Reviewed: 12/04/2008 [employer] Patient Information 2012 [employer]. 

## 2011-09-17 NOTE — ED Notes (Addendum)
Pt states that she has R pelvic pain, hx of hysterectomy in February and that she has both ovaries, feels "like the right ovary is going to pop out of my abdomen".  Pt states that she has not had anything to eat or drink in the past two days due to severe nausea.

## 2011-09-17 NOTE — ED Provider Notes (Signed)
History     CSN: [id number]  Arrival date & time 09/17/11  1116   First MD Initiated Contact with Patient 09/17/11 1254      Chief Complaint  Patient presents with  . Pelvic Pain    (Consider location/radiation/quality/duration/timing/severity/associated sxs/prior treatment) Patient is a 31 y.o. female presenting with pelvic pain. The history is provided by the patient and a significant other.  Pelvic Pain This is a new problem. The current episode started yesterday. The problem occurs constantly. The problem has been gradually worsening. Associated symptoms include abdominal pain. Pertinent negatives include no anorexia, arthralgias, change in bowel habit, chest pain, chills, congestion, coughing, diaphoresis, fatigue, fever, headaches, joint swelling, myalgias, nausea, neck pain, numbness, rash, sore throat, swollen glands, urinary symptoms, vertigo, visual change, vomiting or weakness. Associated symptoms comments: Vaginal discomfort and discharge.. The symptoms are aggravated by nothing. The treatment provided moderate relief.   Pt had hysterectomy earlier this year with tubal ligation.  She has both her ovaries.  She complains of pain mainly located at right ovary.  It feels like "its going to explode"  Past Medical History  Diagnosis Date  . HPV (human papilloma virus) infection   . S/P tubal ligation     Past Surgical History  Procedure Date  . Cholecystectomy   . Cesarean section   . Abdominal hysterectomy   . Appendectomy     History reviewed. No pertinent family history.  History  Substance Use Topics  . Smoking status: Current Everyday Smoker -- 0.5 packs/day    Types: Cigarettes  . Smokeless tobacco: Not on file  . Alcohol Use: No    OB History    Grav Para Term Preterm Abortions TAB SAB Ect Mult Living   3 1 1              Review of Systems  Constitutional: Negative for fever, chills, diaphoresis and fatigue.  HENT: Negative for congestion, sore throat  and neck pain.   Respiratory: Negative for cough.   Cardiovascular: Negative for chest pain.  Gastrointestinal: Positive for abdominal pain. Negative for nausea, vomiting, anorexia and change in bowel habit.  Genitourinary: Positive for pelvic pain.  Musculoskeletal: Negative for myalgias, joint swelling and arthralgias.  Skin: Negative for rash.  Neurological: Negative for vertigo, weakness, numbness and headaches.  All other systems reviewed and are negative.    Allergies  Ibuprofen and Nsaids  Home Medications   Current Outpatient Rx  Name Route Sig Dispense Refill  . ACETAMINOPHEN 500 MG PO TABS Oral Take 1,000 mg by mouth every 6 (six) hours as needed. For pain       BP 131/75  Pulse 98  Temp(Src) 97.9 F (36.6 C) (Oral)  Resp 20  Ht 5\' 2"  (1.575 m)  Wt 165 lb (74.844 kg)  BMI 30.18 kg/m2  SpO2 99%  LMP 07/29/2011  Physical Exam  Nursing note and vitals reviewed. Constitutional: She is oriented to person, place, and time. She appears well-developed and well-nourished.  HENT:  Head: Normocephalic and atraumatic.  Eyes: Conjunctivae and EOM are normal. Pupils are equal, round, and reactive to light.  Neck: Normal range of motion. Neck supple.  Cardiovascular: Normal rate, regular rhythm and normal heart sounds.   Abdominal: Soft. Bowel sounds are normal. She exhibits no distension and no mass. There is tenderness. There is no rebound and no guarding.  Genitourinary: There is tenderness around the vagina. No erythema or bleeding around the vagina. Vaginal discharge found.  Pt doesn't have uterus, s/p partial hysterectomy.  Musculoskeletal: Normal range of motion.  Lymphadenopathy:    She has no cervical adenopathy.  Neurological: She is alert and oriented to person, place, and time. She has normal reflexes.  Skin: Skin is warm and dry. No rash noted. No erythema.  Psychiatric: She has a normal mood and affect. Her behavior is normal.    ED Course    Procedures (including critical care time)  Labs Reviewed  URINALYSIS, ROUTINE W REFLEX MICROSCOPIC - Abnormal; Notable for the following:    Color, Urine AMBER (*) BIOCHEMICALS MAY BE AFFECTED BY COLOR   APPearance TURBID (*)    Specific Gravity, Urine 1.034 (*)    Hgb urine dipstick SMALL (*)    Bilirubin Urine SMALL (*)    Ketones, ur 15 (*)    Protein, ur 30 (*)    Leukocytes, UA SMALL (*)    All other components within normal limits  URINE MICROSCOPIC-ADD ON - Abnormal; Notable for the following:    Squamous Epithelial / LPF MANY (*)    Bacteria, UA MANY (*)    All other components within normal limits  WET PREP, GENITAL - Abnormal; Notable for the following:    Clue Cells Wet Prep HPF POC TOO NUMEROUS TO COUNT (*)    WBC, Wet Prep HPF POC FEW (*)    All other components within normal limits  PREGNANCY, URINE  GC/CHLAMYDIA PROBE AMP, GENITAL   No results found.   1. BV (bacterial vaginosis)       MDM  1. BV: Pt has diagnosis of BV per wet prep.  Rx Flagyl. 2. Pelvic pain: ordered CT a/p for further evaluation  Madelaine Bhat, [place] 09/17/11 1557

## 2011-09-17 NOTE — ED Provider Notes (Signed)
History     CSN: [id number]  Arrival date & time 09/17/11  1116   First MD Initiated Contact with Patient 09/17/11 1254      Chief Complaint  Patient presents with  . Pelvic Pain    (Consider location/radiation/quality/duration/timing/severity/associated sxs/prior treatment) HPI  Past Medical History  Diagnosis Date  . HPV (human papilloma virus) infection   . S/P tubal ligation     Past Surgical History  Procedure Date  . Cholecystectomy   . Cesarean section   . Abdominal hysterectomy   . Appendectomy     History reviewed. No pertinent family history.  History  Substance Use Topics  . Smoking status: Current Everyday Smoker -- 0.5 packs/day    Types: Cigarettes  . Smokeless tobacco: Not on file  . Alcohol Use: No    OB History    Grav Para Term Preterm Abortions TAB SAB Ect Mult Living   3 1 1              Review of Systems  Allergies  Ibuprofen and Nsaids  Home Medications   Current Outpatient Rx  Name Route Sig Dispense Refill  . ACETAMINOPHEN 500 MG PO TABS Oral Take 1,000 mg by mouth every 6 (six) hours as needed. For pain     . HYDROCODONE-ACETAMINOPHEN 5-500 MG PO TABS Oral Take 1-2 tablets by mouth every 6 (six) hours as needed for pain. 15 tablet 0  . METRONIDAZOLE 500 MG PO TABS Oral Take 1 tablet (500 mg total) by mouth 2 (two) times daily. 14 tablet 0    BP 112/78  Pulse 76  Temp(Src) 97.9 F (36.6 C) (Oral)  Resp 18  Ht 5\' 2"  (1.575 m)  Wt 165 lb (74.844 kg)  BMI 30.18 kg/m2  SpO2 100%  LMP 07/29/2011  Physical Exam  ED Course  Procedures (including critical care time)  Labs Reviewed  URINALYSIS, ROUTINE W REFLEX MICROSCOPIC - Abnormal; Notable for the following:    Color, Urine AMBER (*) BIOCHEMICALS MAY BE AFFECTED BY COLOR   APPearance TURBID (*)    Specific Gravity, Urine 1.034 (*)    Hgb urine dipstick SMALL (*)    Bilirubin Urine SMALL (*)    Ketones, ur 15 (*)    Protein, ur 30 (*)    Leukocytes, UA SMALL (*)      All other components within normal limits  URINE MICROSCOPIC-ADD ON - Abnormal; Notable for the following:    Squamous Epithelial / LPF MANY (*)    Bacteria, UA MANY (*)    All other components within normal limits  WET PREP, GENITAL - Abnormal; Notable for the following:    Clue Cells Wet Prep HPF POC TOO NUMEROUS TO COUNT (*)    WBC, Wet Prep HPF POC FEW (*)    All other components within normal limits  CBC - Abnormal; Notable for the following:    RDW 15.6 (*)    All other components within normal limits  PREGNANCY, URINE  COMPREHENSIVE METABOLIC PANEL  GC/CHLAMYDIA PROBE AMP, GENITAL   Ct Abdomen Pelvis W Contrast  09/17/2011  *RADIOLOGY REPORT*  Clinical Data: Lower abdominal and right pelvic pain with nausea and diarrhea.  Post hysterectomy February.  Prior tubal ligation, cholecystectomy and appendectomy.  CT ABDOMEN AND PELVIS WITH CONTRAST  Technique:  Multidetector CT imaging of the abdomen and pelvis was performed following the standard protocol during bolus administration of intravenous contrast.  Contrast:  100 ml Omnipaque-300.  Comparison: 05/21/2011 and 07/04/2006.  Findings: Lung  bases clear.  No extraluminal bowel inflammatory process, free fluid or free air. Under distended portions of bowel slightly limit evaluation.  Too small to characterize and felt to be insignificant sub centimeter low density lesion within left lobe liver.  Post cholecystectomy.  No focal splenic, pancreatic, renal or adrenal lesion.  No abdominal aortic aneurysm.  No adenopathy.  Abnormal appearance of the T11 vertebral body unchanged from remote exam suggesting diffuse hemangioma type changes.  Incidentally noted are small ovarian cysts felt to be within range of normal limits.  Post appendectomy and hysterectomy as per history provided.  IMPRESSION: No acute abnormality.  Please see above.  Original Report Authenticated By: Fuller Canada, M.D.     1. BV (bacterial vaginosis)   2. Ovarian cyst        MDM  Discussed finding with pt and pt if feeling more comfortable at this time       Teressa Lower, NP 09/17/11 1719

## 2011-09-18 NOTE — ED Provider Notes (Signed)
History/physical exam/procedure(s) were performed by non-physician practitioner and as supervising physician I was immediately available for consultation/collaboration. I have reviewed all notes and am in agreement with care and plan.   Yara Tomkinson S Jayke Caul, MD 09/18/11 0717 

## 2011-09-18 NOTE — ED Provider Notes (Signed)
History/physical exam/procedure(s) were performed by non-physician practitioner and as supervising physician I was immediately available for consultation/collaboration. I have reviewed all notes and am in agreement with care and plan.   Denasia Venn S Chrisy Hillebrand, MD 09/18/11 0715 

## 2011-09-19 LAB — GC/CHLAMYDIA PROBE AMP, GENITAL
Chlamydia, DNA Probe: NEGATIVE
GC Probe Amp, Genital: NEGATIVE

## 2011-09-23 ENCOUNTER — Encounter (HOSPITAL_BASED_OUTPATIENT_CLINIC_OR_DEPARTMENT_OTHER): Payer: Self-pay | Admitting: *Deleted

## 2011-09-23 ENCOUNTER — Emergency Department (HOSPITAL_BASED_OUTPATIENT_CLINIC_OR_DEPARTMENT_OTHER)
Admission: EM | Admit: 2011-09-23 | Discharge: 2011-09-23 | Disposition: A | Discharge status: Home / Self Care | Payer: Medicaid Other | Attending: Emergency Medicine | Admitting: Emergency Medicine

## 2011-09-23 DIAGNOSIS — R109 Unspecified abdominal pain: Secondary | ICD-10-CM | POA: Insufficient documentation

## 2011-09-23 DIAGNOSIS — F17200 Nicotine dependence, unspecified, uncomplicated: Secondary | ICD-10-CM | POA: Insufficient documentation

## 2011-09-23 DIAGNOSIS — N760 Acute vaginitis: Secondary | ICD-10-CM

## 2011-09-23 LAB — URINALYSIS, ROUTINE W REFLEX MICROSCOPIC
Bilirubin Urine: NEGATIVE
Glucose, UA: NEGATIVE mg/dL
Hgb urine dipstick: NEGATIVE
Specific Gravity, Urine: 1.024 (ref 1.005–1.030)

## 2011-09-23 LAB — PREGNANCY, URINE: Preg Test, Ur: NEGATIVE

## 2011-09-23 MED ORDER — METRONIDAZOLE 500 MG PO TABS: 500.0000 mg | ORAL_TABLET | Freq: Two times a day (BID) | ORAL | Status: AC

## 2011-09-23 MED ORDER — TRAMADOL HCL 50 MG PO TABS: 50.0000 mg | ORAL_TABLET | Freq: Four times a day (QID) | ORAL | Status: AC | PRN

## 2011-09-23 NOTE — ED Provider Notes (Signed)
History     CSN: [id number]  Arrival date & time 09/23/11  1641   First MD Initiated Contact with Patient 09/23/11 1745      Chief Complaint  Patient presents with  . Abdominal Pain    HPI The patient states she was seen in the emergency room 6 days ago for abdominal pain. Patient is having bilateral lower abdominal pain that radiates to her back. Patient has not had trouble with nausea, vomiting, diarrhea or fever. She has had persistent vaginal discharge.She was seen in the emergency room and had an evaluation that included a urinalysis, pelvic exam and CT scan. Her test results were negative for gonorrhea or chlamydia. She did have too numerous to count clue cells on her wet prep.  The CT scan showed no acute abnormality. She did have incidental ovarian cysts that were felt to be within the range of normal limits. Patient filled her pain medications but was unable to fill the antibiotics due to cost. To the emergency room requesting something else for pain as well as another antibiotic prescription. Past Medical History  Diagnosis Date  . HPV (human papilloma virus) infection   . S/P tubal ligation     Past Surgical History  Procedure Date  . Cholecystectomy   . Cesarean section   . Abdominal hysterectomy   . Appendectomy     No family history on file.  History  Substance Use Topics  . Smoking status: Current Everyday Smoker -- 0.5 packs/day    Types: Cigarettes  . Smokeless tobacco: Not on file  . Alcohol Use: No    OB History    Grav Para Term Preterm Abortions TAB SAB Ect Mult Living   3 1 1              Review of Systems  All other systems reviewed and are negative.    Allergies  Ibuprofen and Nsaids  Home Medications   Current Outpatient Rx  Name Route Sig Dispense Refill  . ACETAMINOPHEN 500 MG PO TABS Oral Take 1,000 mg by mouth every 6 (six) hours as needed. For pain     . HYDROCODONE-ACETAMINOPHEN 5-500 MG PO TABS Oral Take 1-2 tablets by mouth  every 6 (six) hours as needed for pain. 15 tablet 0  . METRONIDAZOLE 500 MG PO TABS Oral Take 1 tablet (500 mg total) by mouth 2 (two) times daily. 14 tablet 0    BP 116/86  Pulse 104  Temp(Src) 98.1 F (36.7 C) (Oral)  Resp 18  SpO2 100%  LMP 07/29/2011  Physical Exam  Nursing note and vitals reviewed. Constitutional: She appears well-developed and well-nourished. No distress.  HENT:  Head: Normocephalic and atraumatic.  Right Ear: External ear normal.  Left Ear: External ear normal.  Eyes: Conjunctivae are normal. Right eye exhibits no discharge. Left eye exhibits no discharge. No scleral icterus.  Neck: Neck supple. No tracheal deviation present.  Cardiovascular: Normal rate, regular rhythm and intact distal pulses.   Pulmonary/Chest: Effort normal and breath sounds normal. No stridor. No respiratory distress. She has no wheezes. She has no rales.  Abdominal: Soft. Bowel sounds are normal. She exhibits no distension. There is tenderness. There is no rebound and no guarding.  Musculoskeletal: She exhibits no edema and no tenderness.  Neurological: She is alert. She has normal strength. No sensory deficit. Cranial nerve deficit:  no gross defecits noted. She exhibits normal muscle tone. She displays no seizure activity. Coordination normal.  Skin: Skin is warm and dry.  No rash noted.  Psychiatric: She has a normal mood and affect.    ED Course  Procedures (including critical care time)  Labs Reviewed  URINALYSIS, ROUTINE W REFLEX MICROSCOPIC - Abnormal; Notable for the following:    APPearance CLOUDY (*)    All other components within normal limits  PREGNANCY, URINE   No results found.   MDM  At this time, I doubt any acute emergency medical condition. Her symptoms are not suggestive of ovarian torsion. She does have persistent bacterial vaginosis which could be causing her some discomfort. I will write her a prescription for Flagyl again and explained to the patient  unfortunately there is no appropriate medication on the [facility] [employer] plan.  Patient requested a prescription for narcotic pain medications however explain to the patient did not feel that was appropriate to continue to take at this time. I did encourage her to followup with a gynecologist if her symptoms persist.        Celene Kras, MD 09/23/11 [phone]

## 2011-09-23 NOTE — Discharge Instructions (Signed)
Bacterial Vaginosis Bacterial vaginosis is an infection of the vagina. A healthy vagina has many kinds of good germs (bacteria). Sometimes the number of good germs can change. This allows bad germs to move in and cause an infection. You may be given medicine (antibiotics) to treat the infection. Or, you may not need treatment at all. HOME CARE  Take your medicine as told. Finish them even if you start to feel better.   Do not have sex until you finish your medicine.   Do not douche.   Practice safe sex.   Tell your sex partner that you have an infection. They should see their doctor for treatment if they have problems.  GET HELP RIGHT AWAY IF:  You do not get better after 3 days of treatment.   You have grey fluid (discharge) coming from your vagina.   You have pain.   You have a temperature of 102 F (38.9 C) or higher.  MAKE SURE YOU:   Understand these instructions.   Will watch your condition.   Will get help right away if you are not doing well or get worse.  Document Released: 03/22/2008 Document Revised: 06/02/2011 Document Reviewed: 03/22/2008 [employer] Patient Information 2012 [employer]. 

## 2011-09-23 NOTE — ED Notes (Signed)
The patient is the rest room getting a urine sample.

## 2011-09-23 NOTE — ED Notes (Signed)
Was diagnosed with BV a week ago. Never got her medication filled. Is here to get a cheaper Rx.

## 2011-09-23 NOTE — ED Notes (Signed)
Patient states she was seen here approximately one week ago and diagnosed and treated for BV.  States she did not get her antibiotic prescription filled only got her pain medications.  Here today because she is still having problems and needs prescriptions from the 4$ plan from [facility].

## 2011-09-29 ENCOUNTER — Emergency Department: Payer: Self-pay | Admitting: Emergency Medicine

## 2011-09-29 LAB — URINALYSIS, COMPLETE
Bilirubin,UR: NEGATIVE
Glucose,UR: NEGATIVE mg/dL (ref 0–75)
Ketone: NEGATIVE
Nitrite: NEGATIVE
Ph: 6 (ref 4.5–8.0)
RBC,UR: 509 /HPF (ref 0–5)
Specific Gravity: 1.017 (ref 1.003–1.030)
Squamous Epithelial: 13
WBC UR: 2 /HPF (ref 0–5)

## 2011-10-10 ENCOUNTER — Emergency Department (HOSPITAL_COMMUNITY)
Admission: EM | Admit: 2011-10-10 | Discharge: 2011-10-10 | Disposition: A | Discharge status: Home / Self Care | Payer: Medicaid Other | Attending: Emergency Medicine | Admitting: Emergency Medicine

## 2011-10-10 ENCOUNTER — Encounter (HOSPITAL_COMMUNITY): Payer: Self-pay | Admitting: Cardiology

## 2011-10-10 ENCOUNTER — Emergency Department (HOSPITAL_COMMUNITY): Payer: Medicaid Other

## 2011-10-10 DIAGNOSIS — G8929 Other chronic pain: Secondary | ICD-10-CM | POA: Insufficient documentation

## 2011-10-10 DIAGNOSIS — N760 Acute vaginitis: Secondary | ICD-10-CM | POA: Insufficient documentation

## 2011-10-10 DIAGNOSIS — R112 Nausea with vomiting, unspecified: Secondary | ICD-10-CM | POA: Insufficient documentation

## 2011-10-10 DIAGNOSIS — B9689 Other specified bacterial agents as the cause of diseases classified elsewhere: Secondary | ICD-10-CM | POA: Insufficient documentation

## 2011-10-10 DIAGNOSIS — A499 Bacterial infection, unspecified: Secondary | ICD-10-CM | POA: Insufficient documentation

## 2011-10-10 DIAGNOSIS — R197 Diarrhea, unspecified: Secondary | ICD-10-CM | POA: Insufficient documentation

## 2011-10-10 DIAGNOSIS — R109 Unspecified abdominal pain: Secondary | ICD-10-CM | POA: Insufficient documentation

## 2011-10-10 LAB — URINALYSIS, ROUTINE W REFLEX MICROSCOPIC
Glucose, UA: NEGATIVE mg/dL
Protein, ur: NEGATIVE mg/dL

## 2011-10-10 LAB — URINE MICROSCOPIC-ADD ON

## 2011-10-10 LAB — WET PREP, GENITAL: Trich, Wet Prep: NONE SEEN

## 2011-10-10 MED ORDER — METRONIDAZOLE 500 MG PO TABS: 500.0000 mg | ORAL_TABLET | Freq: Two times a day (BID) | ORAL | Status: AC

## 2011-10-10 MED ORDER — PROMETHAZINE HCL 25 MG PO TABS: 25.0000 mg | ORAL_TABLET | Freq: Four times a day (QID) | ORAL | Status: DC | PRN

## 2011-10-10 MED ORDER — MORPHINE SULFATE 4 MG/ML IJ SOLN
4.0000 mg | Freq: Once | INTRAMUSCULAR | Status: AC
  Administered 2011-10-10: 4 mg via INTRAVENOUS
  Filled 2011-10-10: qty 1

## 2011-10-10 MED ORDER — ONDANSETRON HCL 4 MG/2ML IJ SOLN
4.0000 mg | Freq: Once | INTRAMUSCULAR | Status: AC
  Administered 2011-10-10: 4 mg via INTRAVENOUS
  Filled 2011-10-10: qty 2

## 2011-10-10 MED ORDER — HYDROMORPHONE HCL PF 1 MG/ML IJ SOLN
1.0000 mg | Freq: Once | INTRAMUSCULAR | Status: AC
  Administered 2011-10-10: 1 mg via INTRAVENOUS
  Filled 2011-10-10: qty 1

## 2011-10-10 MED ORDER — TRAMADOL HCL 50 MG PO TABS: 50.0000 mg | ORAL_TABLET | Freq: Four times a day (QID) | ORAL | Status: AC | PRN

## 2011-10-10 NOTE — ED Notes (Signed)
States pain not any better but understands that she has received 8mg  morphine in the past 2 hours. Pt states she can wait till after the ultrasound before receiving additional analgesia.

## 2011-10-10 NOTE — ED Provider Notes (Signed)
Medical screening examination/treatment/procedure(s) were conducted as a shared visit with non-physician practitioner(s) and myself.  I personally evaluated the patient during the encounter  Doug Sou, MD 10/10/11 1651

## 2011-10-10 NOTE — ED Provider Notes (Signed)
Lower abdominal pain since yesterday. Had similar pain 2 weeks ago was told that she may have passed a kidney stone. Had hematuria 2 weeks ago with pain. Today feels as if she is unable to pass urine. On exam appears mildly uncomfortable abdomen tender over lower quadrants  Doug Sou, MD 10/10/11 1649

## 2011-10-10 NOTE — ED Provider Notes (Signed)
Pt to the CDU while awaiting completion of pelvic US to eval for ovarian cyst causing severe bilateral lower abd pain. Labs reviewed and show likely contaminated urine sample, mild bacterial vaginosis. Multiple prior episodes of similar pain since partial hysterectomy in February of this year. On exam, pt is alert, oriented, and does not appear to be in distress. Lungs CTAB. Heart RRR. Moderate lower abd pain on palpation bilaterally. MAEW. Speech clear.   I have reviewed pt record in [place] controlled substance registry- she has multiple narcotic prescriptions each month that are written in different cities by different providers. Pt admits to chronic pain that has been difficult to control.    2:13 PM Pt pain has been treated in ED with IV dilaudid. Pelvic ultrasound with no acute findings to explain pain. Still with non-surgical abd on exam. I discussed at length with the patient that the emergency department cannot be responsible for treating her chronic pain. She expresses concern that she does not have a follow-up appointment with GYN until Friday and will be in significant pain until that time. However, I stressed to her that I am uncomfortable writing her a prescription for additional narcotics given her history. I will write her a prescription for ultram that I hope will help alleviate her pain some. Case Manager has also spoken at length with patient about getting care with primary providers. She will be discharged from the ED at this time. I will also give rx for phenergan as pt reports N/V/D (though has had no episodes in ED and appears well-hydrated) and I suspect there will be financial barriers to her filling a zofran prescription.  Shaaron Adler, PA-C 10/10/11 1418

## 2011-10-10 NOTE — Discharge Instructions (Signed)
Abdominal Pain (Nonspecific) Your exam might not show the exact reason you have abdominal pain. Since there are many different causes of abdominal pain, another checkup and more tests may be needed. It is very important to follow up for lasting (persistent) or worsening symptoms. A possible cause of abdominal pain in any person who still has his or her appendix is acute appendicitis. Appendicitis is often hard to diagnose. Normal blood tests, urine tests, ultrasound, and CT scans do not completely rule out early appendicitis or other causes of abdominal pain. Sometimes, only the changes that happen over time will allow appendicitis and other causes of abdominal pain to be determined. Other potential problems that may require surgery may also take time to become more apparent. Because of this, it is important that you follow all of the instructions below. HOME CARE INSTRUCTIONS   Rest as much as possible.   Do not eat solid food until your pain is gone.   While adults or children have pain: A diet of water, weak decaffeinated tea, broth or bouillon, gelatin, oral rehydration solutions (ORS), frozen ice pops, or ice chips may be helpful.   When pain is gone in adults or children: Start a light diet (dry toast, crackers, applesauce, or white rice). Increase the diet slowly as long as it does not bother you. Eat no dairy products (including cheese and eggs) and no spicy, fatty, fried, or high-fiber foods.   Use no alcohol, caffeine, or cigarettes.   Take your regular medicines unless your caregiver told you not to.   Take any prescribed medicine as directed.   Only take over-the-counter or prescription medicines for pain, discomfort, or fever as directed by your caregiver. Do not give aspirin to children.  If your caregiver has given you a follow-up appointment, it is very important to keep that appointment. Not keeping the appointment could result in a permanent injury and/or lasting (chronic) pain  and/or disability. If there is any problem keeping the appointment, you must call to reschedule.  SEEK IMMEDIATE MEDICAL CARE IF:   Your pain is not gone in 24 hours.   Your pain becomes worse, changes location, or feels different.   You or your child has an oral temperature above 102 F (38.9 C), not controlled by medicine.   Your baby is older than 3 months with a rectal temperature of 102 F (38.9 C) or higher.   Your baby is 74 months old or younger with a rectal temperature of 100.4 F (38 C) or higher.   You have shaking chills.   You keep throwing up (vomiting) or cannot drink liquids.   There is blood in your vomit or you see blood in your bowel movements.   Your bowel movements become dark or black.   You have frequent bowel movements.   Your bowel movements stop (become blocked) or you cannot pass gas.   You have bloody, frequent, or painful urination.   You have yellow discoloration in the skin or whites of the eyes.   Your stomach becomes bloated or bigger.   You have dizziness or fainting.   You have chest or back pain.  MAKE SURE YOU:   Understand these instructions.   Will watch your condition.   Will get help right away if you are not doing well or get worse.  Document Released: 06/13/2005 Document Revised: 06/02/2011 Document Reviewed: 05/11/2009 [facility] Patient Information 2012 [place], [place].            Nausea and  Vomiting Nausea is a sick feeling that often comes before throwing up (vomiting). Vomiting is a reflex where stomach contents come out of your mouth. Vomiting can cause severe loss of body fluids (dehydration). Children and elderly adults can become dehydrated quickly, especially if they also have diarrhea. Nausea and vomiting are symptoms of a condition or disease. It is important to find the cause of your symptoms. CAUSES   Direct irritation of the stomach lining. This irritation can result from increased acid production  (gastroesophageal reflux disease), infection, food poisoning, taking certain medicines (such as nonsteroidal anti-inflammatory drugs), alcohol use, or tobacco use.   Signals from the brain.These signals could be caused by a headache, heat exposure, an inner ear disturbance, increased pressure in the brain from injury, infection, a tumor, or a concussion, pain, emotional stimulus, or metabolic problems.   An obstruction in the gastrointestinal tract (bowel obstruction).   Illnesses such as diabetes, hepatitis, gallbladder problems, appendicitis, kidney problems, cancer, sepsis, atypical symptoms of a heart attack, or eating disorders.   Medical treatments such as chemotherapy and radiation.   Receiving medicine that makes you sleep (general anesthetic) during surgery.  DIAGNOSIS Your caregiver may ask for tests to be done if the problems do not improve after a few days. Tests may also be done if symptoms are severe or if the reason for the nausea and vomiting is not clear. Tests may include:  Urine tests.   Blood tests.   Stool tests.   Cultures (to look for evidence of infection).   X-rays or other imaging studies.  Test results can help your caregiver make decisions about treatment or the need for additional tests. TREATMENT You need to stay well hydrated. Drink frequently but in small amounts.You may wish to drink water, sports drinks, clear broth, or eat frozen ice pops or gelatin dessert to help stay hydrated.When you eat, eating slowly may help prevent nausea.There are also some antinausea medicines that may help prevent nausea. HOME CARE INSTRUCTIONS   Take all medicine as directed by your caregiver.   If you do not have an appetite, do not force yourself to eat. However, you must continue to drink fluids.   If you have an appetite, eat a normal diet unless your caregiver tells you differently.   Eat a variety of complex carbohydrates (rice, wheat, potatoes, bread), lean  meats, yogurt, fruits, and vegetables.   Avoid high-fat foods because they are more difficult to digest.   Drink enough water and fluids to keep your urine clear or pale yellow.   If you are dehydrated, ask your caregiver for specific rehydration instructions. Signs of dehydration may include:   Severe thirst.   Dry lips and mouth.   Dizziness.   Dark urine.   Decreasing urine frequency and amount.   Confusion.   Rapid breathing or pulse.  SEEK IMMEDIATE MEDICAL CARE IF:   You have blood or brown flecks (like coffee grounds) in your vomit.   You have black or bloody stools.   You have a severe headache or stiff neck.   You are confused.   You have severe abdominal pain.   You have chest pain or trouble breathing.   You do not urinate at least once every 8 hours.   You develop cold or clammy skin.   You continue to vomit for longer than 24 to 48 hours.   You have a fever.  MAKE SURE YOU:   Understand these instructions.   Will watch your condition.  Will get help right away if you are not doing well or get worse.  Document Released: 06/13/2005 Document Revised: 06/02/2011 Document Reviewed: 11/10/2010 [facility] Patient Information 2012 [place], [place].           Bacterial Vaginosis Bacterial vaginosis (BV) is a vaginal infection where the normal balance of bacteria in the vagina is disrupted. The normal balance is then replaced by an overgrowth of certain bacteria. There are several different kinds of bacteria that can cause BV. BV is the most common vaginal infection in women of childbearing age. CAUSES   The cause of BV is not fully understood. BV develops when there is an increase or imbalance of harmful bacteria.   Some activities or behaviors can upset the normal balance of bacteria in the vagina and put women at increased risk including:   Having a new sex partner or multiple sex partners.   Douching.   Using an intrauterine device (IUD)  for contraception.   It is not clear what role sexual activity plays in the development of BV. However, women that have never had sexual intercourse are rarely infected with BV.  Women do not get BV from toilet seats, bedding, swimming pools or from touching objects around them.  SYMPTOMS   Grey vaginal discharge.   A fish-like odor with discharge, especially after sexual intercourse.   Itching or burning of the vagina and vulva.   Burning or pain with urination.   Some women have no signs or symptoms at all.  DIAGNOSIS  Your caregiver must examine the vagina for signs of BV. Your caregiver will perform lab tests and look at the sample of vaginal fluid through a microscope. They will look for bacteria and abnormal cells (clue cells), a pH test higher than 4.5, and a positive amine test all associated with BV.  RISKS AND COMPLICATIONS   Pelvic inflammatory disease (PID).   Infections following gynecology surgery.   Developing HIV.   Developing herpes virus.  TREATMENT  Sometimes BV will clear up without treatment. However, all women with symptoms of BV should be treated to avoid complications, especially if gynecology surgery is planned. Female partners generally do not need to be treated. However, BV may spread between female sex partners so treatment is helpful in preventing a recurrence of BV.   BV may be treated with antibiotics. The antibiotics come in either pill or vaginal cream forms. Either can be used with nonpregnant or pregnant women, but the recommended dosages differ. These antibiotics are not harmful to the baby.   BV can recur after treatment. If this happens, a second round of antibiotics will often be prescribed.   Treatment is important for pregnant women. If not treated, BV can cause a premature delivery, especially for a pregnant woman who had a premature birth in the past. All pregnant women who have symptoms of BV should be checked and treated.   For chronic  reoccurrence of BV, treatment with a type of prescribed gel vaginally twice a week is helpful.  HOME CARE INSTRUCTIONS   Finish all medication as directed by your caregiver.   Do not have sex until treatment is completed.   Tell your sexual partner that you have a vaginal infection. They should see their caregiver and be treated if they have problems, such as a mild rash or itching.   Practice safe sex. Use condoms. Only have 1 sex partner.  PREVENTION  Basic prevention steps can help reduce the risk of upsetting the natural balance  of bacteria in the vagina and developing BV:  Do not have sexual intercourse (be abstinent).   Do not douche.   Use all of the medicine prescribed for treatment of BV, even if the signs and symptoms go away.   Tell your sex partner if you have BV. That way, they can be treated, if needed, to prevent reoccurrence.  SEEK MEDICAL CARE IF:   Your symptoms are not improving after 3 days of treatment.   You have increased discharge, pain, or fever.  MAKE SURE YOU:   Understand these instructions.   Will watch your condition.   Will get help right away if you are not doing well or get worse.  FOR MORE INFORMATION  Division of STD Prevention (DSTDP), Centers for Disease Control and Prevention: [employer] American Social Health Association (ASHA): www.ashastd.org  Document Released: 06/13/2005 Document Revised: 06/02/2011 Document Reviewed: 12/04/2008 [facility] Patient Information 2012 [place], [place].             Chronic Pain Discharge Instructions  Emergency care providers appreciate that many patients coming to [place] are in severe pain and we wish to address their pain in the safest, most responsible manner.  It is important to recognize however, that the proper treatment of chronic pain differs from that of the pain of injuries and acute illnesses.  Our goal is to provide quality, safe, personalized care and we thank you for giving [place] the  opportunity to serve you. The use of narcotics and related agents for chronic pain syndromes may lead to additional physical and psychological problems.  Nearly as many people die from prescription narcotics each year as die from car crashes.  Additionally, this risk is increased if such prescriptions are obtained from a variety of sources.  Therefore, only your primary care physician or a pain management specialist is able to safely treat such syndromes with narcotic medications long-term.    Documentation revealing such prescriptions have been sought from multiple sources may prohibit [place] from providing a refill or different narcotic medication.  Your name may be checked first through the [facility] Controlled Substances Reporting System.  This database is a record of controlled substance medication prescriptions that the patient has received.  This has been established by [facility] in an effort to eliminate the dangerous, and often life threatening, practice of obtaining multiple prescriptions from different medical providers.   If you have a chronic pain syndrome (i.e. chronic headaches, recurrent back or neck pain, dental pain, abdominal or pelvis pain without a specific diagnosis, or neuropathic pain such as fibromyalgia) or recurrent visits for the same condition without an acute diagnosis, you may be treated with non-narcotics and other non-addictive medicines.  Allergic reactions or negative side effects that may be reported by a patient to such medications will not typically lead to the use of a narcotic analgesic or other controlled substance as an alternative.   Patients managing chronic pain with a personal physician should have provisions in place for breakthrough pain.  If you are in crisis, you should call your physician.  If your physician directs you to the emergency department, please have the doctor call and speak to our attending physician concerning your care.   When patients  come to the Emergency Department (ED) with acute medical conditions in which the Emergency Department physician feels appropriate to prescribe narcotic or sedating pain medication, the physician will prescribe these in very limited quantities.  The amount of these medications will last only until you can  see your primary care physician in his/her office.  Any patient who returns to the ED seeking refills should expect only non-narcotic pain medications.   In the event of an acute medical condition exists and the emergency physician feels it is necessary that the patient be given a narcotic or sedating medication -  a responsible adult driver should be present in the room prior to the medication being given by the nurse.   Prescriptions for narcotic or sedating medications that have been lost, stolen or expired will not be refilled in the Emergency Department.    Patients who have chronic pain may receive non-narcotic prescriptions until seen by their primary care physician.  It is every patient's personal responsibility to maintain active prescriptions with his or her primary care physician or specialist.

## 2011-10-10 NOTE — ED Notes (Signed)
Returned from ultrasound. Requesting analgesia. PA aware. Of note: pt has been seen in several other ED/UCC's in the area for same problem.

## 2011-10-10 NOTE — ED Provider Notes (Signed)
History     CSN: [id number]  Arrival date & time 10/10/11  4098   First MD Initiated Contact with Patient 10/10/11 [phone]      Chief Complaint  Patient presents with  . Abdominal Pain    (Consider location/radiation/quality/duration/timing/severity/associated sxs/prior treatment) HPI History provided by pt.   Pt has had severe, constant, sharp, right lower abd pain that radiates to LLQ and around back bilaterally since yesterday.  Associated w/ N/V/D, malodorous urine and abnormal, yellow vaginal discharge.  Denies fever, hematemesis/hematochezia/melena and other GU sx.  Had similar pain approx 2 weeks ago and was evaluated at [facility].  Was diagnosed with possibly passed kidney stone at that time.  Pain today is worse.  Past abd surgeries include hysterectomy.   Past Medical History  Diagnosis Date  . HPV (human papilloma virus) infection   . S/P tubal ligation     Past Surgical History  Procedure Date  . Cholecystectomy   . Cesarean section   . Abdominal hysterectomy   . Appendectomy     History reviewed. No pertinent family history.  History  Substance Use Topics  . Smoking status: Current Everyday Smoker -- 0.5 packs/day    Types: Cigarettes  . Smokeless tobacco: Not on file  . Alcohol Use: No    OB History    Grav Para Term Preterm Abortions TAB SAB Ect Mult Living   3 1 1              Review of Systems  All other systems reviewed and are negative.    Allergies  Ibuprofen and Nsaids  Home Medications   Current Outpatient Rx  Name Route Sig Dispense Refill  . ACETAMINOPHEN 500 MG PO TABS Oral Take 1,000 mg by mouth every 6 (six) hours as needed. For pain       BP 103/58  Pulse 78  Temp 97.6 F (36.4 C)  Resp 16  SpO2 99%  LMP 07/29/2011  Physical Exam  Nursing note and vitals reviewed. Constitutional: She is oriented to person, place, and time. She appears well-developed and well-nourished.       Uncomfortable appearing  HENT:    Head: Normocephalic and atraumatic.  Eyes:       Normal appearance  Neck: Normal range of motion.  Cardiovascular: Normal rate and regular rhythm.   Pulmonary/Chest: Effort normal and breath sounds normal.  Abdominal: Soft. Bowel sounds are normal. She exhibits no distension and no mass. There is no rebound and no guarding.       Moderate LLQ, mid-line abd and suprapubic tenderness.  Mod-sev RLQ tenderness.  Mild R CVA tenderness  Genitourinary:       Nml external genitalia.  No vaginal discharge/bleeding.  Cervix has been removed.  Bilateral adnexal tenderness.   Neurological: She is alert and oriented to person, place, and time.  Skin: Skin is warm and dry. No rash noted.  Psychiatric: She has a normal mood and affect. Her behavior is normal.    ED Course  Procedures (including critical care time)  Labs Reviewed  WET PREP, GENITAL - Abnormal; Notable for the following:    Clue Cells Wet Prep HPF POC FEW (*)    WBC, Wet Prep HPF POC MODERATE (*)    All other components within normal limits  URINALYSIS, ROUTINE W REFLEX MICROSCOPIC - Abnormal; Notable for the following:    APPearance CLOUDY (*)    Leukocytes, UA TRACE (*)    All other components within normal limits  URINE  MICROSCOPIC-ADD ON - Abnormal; Notable for the following:    Squamous Epithelial / LPF MANY (*)    Bacteria, UA FEW (*)    All other components within normal limits  GC/CHLAMYDIA PROBE AMP, GENITAL   No results found.   No diagnosis found.    MDM  Pt w/ h/o partial hysterectomy and ovarian cysts presents w/ c/o severe, diffuse lower abd pain that seems to originate in RLQ, w/ associated N/V/D, malodorous urine and vaginal discharge.  Evaluated for same at [facility] 2 weeks ago, had hematuria at that time and was told she may have passed a kidney stone.  On exam, afebrile, uncomfortable appearing, diffuse lower abd as well as bilateral adnexal ttp.  Appendicitis unlikely because adnexal tenderness  reportedly worse than RLQ and had similar sx 2 weeks ago. Doubt torsion because pain constant since yesterday, but transvaginal US ordered to look for ovarian cyst.  Pt has received morphine and zofran for symptoms w/ improvement.  Moved to CDU.  Artis Flock, PA-C to dispo.  12:06 PM         Otilio Miu, PA 10/10/11 1209

## 2011-10-10 NOTE — ED Notes (Signed)
Pt to department via EMS from home- reports that she has been having n/v/d since 1600 yesterday. C/o pain in lower quadrants that radiates into the groin. 4mg  zofran Bp- 126/76 Hr-90.

## 2011-10-10 NOTE — ED Notes (Signed)
Pt wanting additional analgesia prior to d/c. PA aware, no new order given.

## 2011-10-10 NOTE — ED Provider Notes (Signed)
Medical screening examination/treatment/procedure(s) were conducted as a shared visit with non-physician practitioner(s) and myself.  I personally evaluated the patient during the encounter  Doug Sou, MD 10/10/11 1652

## 2011-10-10 NOTE — Progress Notes (Signed)
Met with patient to discuss her health care habits. Patient states she has an assigned physician but she does not see him anymore because of a "difference of opinion." Reminded patient she needs to contact DSS and get her PCP changed. Patient states that her Medicaid does not cover her prescriptions. However, after speaking with financial counselor Morrie Sheldon), discovered patient is flagged in Medicaid to only use one prescriber and one pharmacy for any controlled substances and she does have full prescription coverage. We discussed the patient's tendency to pay visits to all [facility] EDs and further out to [facility] and [facility]. Discussed the need for one physician to follow-up with and that the ED cannot treat chronic pain. Patient also informed me that she does have a referred appt from [facility] to [facility] in [facility] - Dr. Jean Rosenthal on Friday, 4/19. I encouraged patient to attend this appt so she could get to the bottom of her abdominal pain.

## 2011-10-10 NOTE — ED Notes (Signed)
Patient transported to Ultrasound 

## 2011-10-11 LAB — GC/CHLAMYDIA PROBE AMP, GENITAL: GC Probe Amp, Genital: NEGATIVE

## 2011-11-11 ENCOUNTER — Emergency Department (HOSPITAL_COMMUNITY)
Admission: EM | Admit: 2011-11-11 | Discharge: 2011-11-11 | Disposition: A | Discharge status: Home / Self Care | Payer: Medicaid Other | Attending: Emergency Medicine | Admitting: Emergency Medicine

## 2011-11-11 ENCOUNTER — Encounter (HOSPITAL_COMMUNITY): Payer: Self-pay | Admitting: *Deleted

## 2011-11-11 DIAGNOSIS — Z79899 Other long term (current) drug therapy: Secondary | ICD-10-CM | POA: Insufficient documentation

## 2011-11-11 DIAGNOSIS — R109 Unspecified abdominal pain: Secondary | ICD-10-CM | POA: Insufficient documentation

## 2011-11-11 DIAGNOSIS — E669 Obesity, unspecified: Secondary | ICD-10-CM | POA: Insufficient documentation

## 2011-11-11 DIAGNOSIS — R112 Nausea with vomiting, unspecified: Secondary | ICD-10-CM | POA: Insufficient documentation

## 2011-11-11 DIAGNOSIS — F17200 Nicotine dependence, unspecified, uncomplicated: Secondary | ICD-10-CM | POA: Insufficient documentation

## 2011-11-11 DIAGNOSIS — R197 Diarrhea, unspecified: Secondary | ICD-10-CM | POA: Insufficient documentation

## 2011-11-11 LAB — COMPREHENSIVE METABOLIC PANEL
Albumin: 4.1 g/dL (ref 3.5–5.2)
BUN: 10 mg/dL (ref 6–23)
Creatinine, Ser: 0.6 mg/dL (ref 0.50–1.10)
Total Protein: 7.5 g/dL (ref 6.0–8.3)

## 2011-11-11 LAB — LIPASE, BLOOD: Lipase: 33 U/L (ref 11–59)

## 2011-11-11 LAB — POCT I-STAT, CHEM 8
BUN: 8 mg/dL (ref 6–23)
Chloride: 107 mEq/L (ref 96–112)
HCT: 48 % — ABNORMAL HIGH (ref 36.0–46.0)
Sodium: 143 mEq/L (ref 135–145)
TCO2: 25 mmol/L (ref 0–100)

## 2011-11-11 LAB — DIFFERENTIAL
Basophils Relative: 0 % (ref 0–1)
Eosinophils Absolute: 0.1 10*3/uL (ref 0.0–0.7)
Eosinophils Relative: 1 % (ref 0–5)
Monocytes Absolute: 0.4 10*3/uL (ref 0.1–1.0)
Monocytes Relative: 4 % (ref 3–12)
Neutrophils Relative %: 77 % (ref 43–77)

## 2011-11-11 LAB — CBC
HCT: 44 % (ref 36.0–46.0)
Hemoglobin: 15.1 g/dL — ABNORMAL HIGH (ref 12.0–15.0)
MCH: 32.2 pg (ref 26.0–34.0)
MCHC: 34.3 g/dL (ref 30.0–36.0)
MCV: 93.8 fL (ref 78.0–100.0)

## 2011-11-11 MED ORDER — HYDROMORPHONE HCL PF 1 MG/ML IJ SOLN
1.0000 mg | Freq: Once | INTRAMUSCULAR | Status: AC
  Administered 2011-11-11: 1 mg via INTRAVENOUS
  Filled 2011-11-11: qty 1

## 2011-11-11 MED ORDER — MORPHINE SULFATE 4 MG/ML IJ SOLN
4.0000 mg | Freq: Once | INTRAMUSCULAR | Status: AC
  Administered 2011-11-11: 4 mg via INTRAVENOUS
  Filled 2011-11-11: qty 1

## 2011-11-11 MED ORDER — ONDANSETRON HCL 4 MG/2ML IJ SOLN
4.0000 mg | Freq: Once | INTRAMUSCULAR | Status: AC
  Administered 2011-11-11: 4 mg via INTRAVENOUS
  Filled 2011-11-11: qty 2

## 2011-11-11 MED ORDER — ONDANSETRON HCL 8 MG PO TABS: 8.0000 mg | ORAL_TABLET | Freq: Three times a day (TID) | ORAL | Status: AC | PRN

## 2011-11-11 MED ORDER — HYDROCODONE-ACETAMINOPHEN 5-500 MG PO TABS: 1.0000 | ORAL_TABLET | Freq: Four times a day (QID) | ORAL | Status: AC | PRN

## 2011-11-11 MED ORDER — SODIUM CHLORIDE 0.9 % IV BOLUS (SEPSIS)
1000.0000 mL | Freq: Once | INTRAVENOUS | Status: AC
  Administered 2011-11-11: 1000 mL via INTRAVENOUS

## 2011-11-11 MED ORDER — POTASSIUM CHLORIDE CRYS ER 20 MEQ PO TBCR
20.0000 meq | EXTENDED_RELEASE_TABLET | Freq: Once | ORAL | Status: AC
  Administered 2011-11-11: 20 meq via ORAL
  Filled 2011-11-11: qty 1

## 2011-11-11 MED ORDER — PANTOPRAZOLE SODIUM 40 MG IV SOLR
40.0000 mg | Freq: Once | INTRAVENOUS | Status: AC
  Administered 2011-11-11: 40 mg via INTRAVENOUS
  Filled 2011-11-11: qty 40

## 2011-11-11 NOTE — ED Provider Notes (Signed)
History     CSN: [id number]  Arrival date & time 11/11/11  1408   First MD Initiated Contact with Patient 11/11/11 1540      Chief Complaint  Patient presents with  . Abdominal Pain    (Consider location/radiation/quality/duration/timing/severity/associated sxs/prior treatment) Patient is a 31 y.o. female presenting with abdominal pain. The history is provided by the patient.  Abdominal Pain The primary symptoms of the illness include abdominal pain, vomiting and diarrhea. The primary symptoms of the illness do not include fever, shortness of breath, dysuria or vaginal discharge.  Symptoms associated with the illness do not include chills, constipation or back pain.  pt c/o mid abd pain and nvd in past day. Pain, dull/crampy, constant, without specific exacerbating or alleviating factors. Non radiating. Emesis clear, not bloody or bilious. Diarrhea loose, not bloody or mucousy. No recent abx use or known ill contacts. No gu c/o. No vaginal discharge or bleeding. Hx hysterectomy, cholecystectomy, appendectomy. No back or flank pain. No fever or chills. Denies hx pud.   Past Medical History  Diagnosis Date  . HPV (human papilloma virus) infection   . S/P tubal ligation     Past Surgical History  Procedure Date  . Cholecystectomy   . Cesarean section   . Abdominal hysterectomy   . Appendectomy     No family history on file.  History  Substance Use Topics  . Smoking status: Current Everyday Smoker -- 0.5 packs/day    Types: Cigarettes  . Smokeless tobacco: Not on file  . Alcohol Use: No    OB History    Grav Para Term Preterm Abortions TAB SAB Ect Mult Living   3 1 1              Review of Systems  Constitutional: Negative for fever and chills.  HENT: Negative for sore throat and neck pain.   Eyes: Negative for redness.  Respiratory: Negative for cough and shortness of breath.   Cardiovascular: Negative for chest pain.  Gastrointestinal: Positive for vomiting,  abdominal pain and diarrhea. Negative for constipation.  Genitourinary: Negative for dysuria, flank pain and vaginal discharge.  Musculoskeletal: Negative for back pain.  Skin: Negative for rash.  Neurological: Negative for headaches.  Hematological: Does not bruise/bleed easily.  Psychiatric/Behavioral: Negative for confusion.    Allergies  Ibuprofen and Nsaids  Home Medications   Current Outpatient Rx  Name Route Sig Dispense Refill  . PROMETHAZINE HCL 25 MG PO TABS Oral Take 1 tablet (25 mg total) by mouth every 6 (six) hours as needed for nausea. 20 tablet 0  . PROMETHAZINE HCL 25 MG PO TABS Oral Take 1 tablet (25 mg total) by mouth every 6 (six) hours as needed for nausea. 20 tablet 0    BP 124/91  Pulse 96  Temp(Src) 98.3 F (36.8 C) (Oral)  Resp 18  SpO2 100%  LMP 07/29/2011  Physical Exam  Nursing note and vitals reviewed. Constitutional: She is oriented to person, place, and time. She appears well-developed and well-nourished. No distress.  Eyes: Conjunctivae are normal. No scleral icterus.  Neck: Neck supple. No tracheal deviation present.  Cardiovascular: Normal rate, regular rhythm, normal heart sounds and intact distal pulses.   Pulmonary/Chest: Effort normal and breath sounds normal. No respiratory distress.  Abdominal: Soft. Normal appearance and bowel sounds are normal. She exhibits no distension and no mass. There is no tenderness. There is no rebound and no guarding.       Obese. Non tender. No incarc hernia.  Genitourinary:       No cva tenderness  Musculoskeletal: She exhibits no edema and no tenderness.  Neurological: She is alert and oriented to person, place, and time.  Skin: Skin is warm and dry. No rash noted. No erythema.  Psychiatric: She has a normal mood and affect.    ED Course  Procedures (including critical care time)   Labs Reviewed  CBC  DIFFERENTIAL  COMPREHENSIVE METABOLIC PANEL  LIPASE, BLOOD   Results for orders placed  during the hospital encounter of 11/11/11  CBC      Component Value Range   WBC 9.4  4.0 - 10.5 (K/uL)   RBC 4.69  3.87 - 5.11 (MIL/uL)   Hemoglobin 15.1 (*) 12.0 - 15.0 (g/dL)   HCT 16.1  09.6 - 04.5 (%)   MCV 93.8  78.0 - 100.0 (fL)   MCH 32.2  26.0 - 34.0 (pg)   MCHC 34.3  30.0 - 36.0 (g/dL)   RDW 40.9  81.1 - 91.4 (%)   Platelets 336  150 - 400 (K/uL)  DIFFERENTIAL      Component Value Range   Neutrophils Relative 77  43 - 77 (%)   Neutro Abs 7.3  1.7 - 7.7 (K/uL)   Lymphocytes Relative 18  12 - 46 (%)   Lymphs Abs 1.7  0.7 - 4.0 (K/uL)   Monocytes Relative 4  3 - 12 (%)   Monocytes Absolute 0.4  0.1 - 1.0 (K/uL)   Eosinophils Relative 1  0 - 5 (%)   Eosinophils Absolute 0.1  0.0 - 0.7 (K/uL)   Basophils Relative 0  0 - 1 (%)   Basophils Absolute 0.0  0.0 - 0.1 (K/uL)  COMPREHENSIVE METABOLIC PANEL      Component Value Range   Sodium 140  135 - 145 (mEq/L)   Potassium 3.4 (*) 3.5 - 5.1 (mEq/L)   Chloride 105  96 - 112 (mEq/L)   CO2 24  19 - 32 (mEq/L)   Glucose, Bld 103 (*) 70 - 99 (mg/dL)   BUN 10  6 - 23 (mg/dL)   Creatinine, Ser 7.82  0.50 - 1.10 (mg/dL)   Calcium 9.4  8.4 - 95.6 (mg/dL)   Total Protein 7.5  6.0 - 8.3 (g/dL)   Albumin 4.1  3.5 - 5.2 (g/dL)   AST 11  0 - 37 (U/L)   ALT 13  0 - 35 (U/L)   Alkaline Phosphatase 83  39 - 117 (U/L)   Total Bilirubin 0.3  0.3 - 1.2 (mg/dL)   GFR calc non Af Amer >90  >90 (mL/min)   GFR calc Af Amer >90  >90 (mL/min)  LIPASE, BLOOD      Component Value Range   Lipase 33  11 - 59 (U/L)  POCT I-STAT, CHEM 8      Component Value Range   Sodium 143  135 - 145 (mEq/L)   Potassium 3.4 (*) 3.5 - 5.1 (mEq/L)   Chloride 107  96 - 112 (mEq/L)   BUN 8  6 - 23 (mg/dL)   Creatinine, Ser 2.13  0.50 - 1.10 (mg/dL)   Glucose, Bld 086 (*) 70 - 99 (mg/dL)   Calcium, Ion 5.78  4.69 - 1.32 (mmol/L)   TCO2 25  0 - 100 (mmol/L)   Hemoglobin 16.3 (*) 12.0 - 15.0 (g/dL)   HCT 62.9 (*) 52.8 - 46.0 (%)       MDM  Iv ns bolus.  zofran iv. Morphine iv.  Reviewed  prior labs, cts, charts - notes made of chronic abdominal pain, recent prior ct abd for same negative for acute process. Pt w hx appendectomy, hysterectomy and cholecystectomy.    Pt notes persistent pain, requests more pain med.   Recheck abd soft nt. No further emesis.   Dilaudid 1 mg iv.   kcl 20 po.   abd soft nt.   No recurrent nv. No fevers, abd exam benign.       Suzi Roots, MD 11/11/11 [phone]

## 2011-11-11 NOTE — ED Notes (Signed)
Per EMS, pt c/o abd pain and nausea since yesterday. VSS.

## 2011-11-11 NOTE — Discharge Instructions (Signed)
Take zofran as need for nausea. You may take vicodin as need for pain. No driving for the next 8 hours or when taking vicodin. Also, do not take tylenol or acetaminophen containing medication when taking vicodin. Follow up with primary care doctor in the next 1-2 days for recheck.  Return to ER if worse, fevers, persistent vomiting, other concern.   Your potassium level was slightly low (3.4) - eat plenty of fruits and vegetables, and follow up with primary care doctor in 1-2 weeks.      Abdominal Pain Abdominal pain can be caused by many things. Your caregiver decides the seriousness of your pain by an examination and possibly blood tests and X-rays. Many cases can be observed and treated at home. Most abdominal pain is not caused by a disease and will probably improve without treatment. However, in many cases, more time must pass before a clear cause of the pain can be found. Before that point, it may not be known if you need more testing, or if hospitalization or surgery is needed. HOME CARE INSTRUCTIONS   Do not take laxatives unless directed by your caregiver.   Take pain medicine only as directed by your caregiver.   Only take over-the-counter or prescription medicines for pain, discomfort, or fever as directed by your caregiver.   Try a clear liquid diet (broth, tea, or water) for as long as directed by your caregiver. Slowly move to a bland diet as tolerated.  SEEK IMMEDIATE MEDICAL CARE IF:   The pain does not go away.   You have a fever.   You keep throwing up (vomiting).   The pain is felt only in portions of the abdomen. Pain in the right side could possibly be appendicitis. In an adult, pain in the left lower portion of the abdomen could be colitis or diverticulitis.   You pass bloody or black tarry stools.  MAKE SURE YOU:   Understand these instructions.   Will watch your condition.   Will get help right away if you are not doing well or get worse.  Document  Released: 03/23/2005 Document Revised: 06/02/2011 Document Reviewed: 01/30/2008 [facility] Patient Information 2012 [place], [place].       Hypokalemia Hypokalemia means a low potassium level in the blood.Potassium is an electrolyte that helps regulate the amount of fluid in the body. It also stimulates muscle contraction and maintains a stable acid-base balance.Most of the body's potassium is inside of cells, and only a very small amount is in the blood. Because the amount in the blood is so small, minor changes can have big effects. PREPARATION FOR TEST Testing for potassium requires taking a blood sample taken by needle from a vein in the arm. The skin is cleaned thoroughly before the sample is drawn. There is no other special preparation needed. NORMAL VALUES Potassium levels below 3.5 mEq/L are abnormally low. Levels above 5.1 mEq/L are abnormally high. Ranges for normal findings may vary among different laboratories and hospitals. You should always check with your doctor after having lab work or other tests done to discuss the meaning of your test results and whether your values are considered within normal limits. MEANING OF TEST  Your caregiver will go over the test results with you and discuss the importance and meaning of your results, as well as treatment options and the need for additional tests, if necessary. A potassium level is frequently part of a routine medical exam. It is usually included as part of a whole "panel"  of tests for several blood salts (such as Sodium and Chloride). It may be done as part of follow-up when a low potassium level was found in the past or other blood salts are suspected of being out of balance. A low potassium level might be suspected if you have one or more of the following:  Symptoms of weakness.   Abnormal heart rhythms.   High blood pressure and are taking medication to control this, especially water pills (diuretics).   Kidney disease that  can affect your potassium level .   Diabetes requiring the use of insulin. The potassium may fall after taking insulin, especially if the diabetes had been out of control for a while.   A condition requiring the use of cortisone-type medication or certain types of antibiotics.   Vomiting and/or diarrhea for more than a day or two.   A stomach or intestinal condition that may not permit appropriate absorption of potassium.   Fainting episodes.   Mental confusion.  OBTAINING TEST RESULTS It is your responsibility to obtain your test results. Ask the lab or department performing the test when and how you will get your results.  Please contact your caregiver directly if you have not received the results within one week. At that time, ask if there is anything different or new you should be doing in relation to the results. TREATMENT Hypokalemia can be treated with potassium supplements taken by mouth and/or adjustments in your current medications. A diet high in potassium is also helpful. Foods with high potassium content are:  Peas, lentils, lima beans, nuts, and dried fruit.   Whole grain and bran cereals and breads.   Fresh fruit, vegetables (bananas, cantaloupe, grapefruit, oranges, tomatoes, honeydew melons, potatoes).   Orange and tomato juices.   Meats. If potassium supplement has been prescribed for you today or your medications have been adjusted, see your personal caregiver in time02 for a re-check.  SEEK MEDICAL CARE IF:  There is a feeling of worsening weakness.   You experience repeated chest palpitations.   You are diabetic and having difficulty keeping your blood sugars in the normal range.   You are experiencing vomiting and/or diarrhea.   You are having difficulty with any of your regular medications.  SEEK IMMEDIATE MEDICAL CARE IF:  You experience chest pain, shortness of breath, or episodes of dizziness.   You have been having vomiting or diarrhea for more  than 2 days.   You have a fainting episode.  MAKE SURE YOU:   Understand these instructions.   Will watch your condition.   Will get help right away if you are not doing well or get worse.  Document Released: 06/13/2005 Document Revised: 06/02/2011 Document Reviewed: 05/24/2008 [facility] Patient Information 2012 [place], [place].

## 2011-11-11 NOTE — ED Notes (Signed)
Pt laying on rt side. "I don't think that medicine has helped much. I am still hurting." no distress noted. Informed pt will notify MD

## 2011-11-11 NOTE — ED Notes (Signed)
Pt in from home by ems. C/o abd pain at belly button, radiating through to back x2 days. C/o yellow emesis x5, diarrhea x6 in past 24 hrs.

## 2011-11-15 ENCOUNTER — Emergency Department: Payer: Self-pay | Admitting: Emergency Medicine

## 2011-12-10 ENCOUNTER — Encounter (HOSPITAL_BASED_OUTPATIENT_CLINIC_OR_DEPARTMENT_OTHER): Payer: Self-pay | Admitting: *Deleted

## 2011-12-10 ENCOUNTER — Emergency Department (HOSPITAL_BASED_OUTPATIENT_CLINIC_OR_DEPARTMENT_OTHER)
Admission: EM | Admit: 2011-12-10 | Discharge: 2011-12-10 | Disposition: A | Discharge status: Home / Self Care | Payer: Medicaid Other | Attending: Emergency Medicine | Admitting: Emergency Medicine

## 2011-12-10 DIAGNOSIS — B9689 Other specified bacterial agents as the cause of diseases classified elsewhere: Secondary | ICD-10-CM | POA: Insufficient documentation

## 2011-12-10 DIAGNOSIS — N760 Acute vaginitis: Secondary | ICD-10-CM | POA: Insufficient documentation

## 2011-12-10 DIAGNOSIS — A499 Bacterial infection, unspecified: Secondary | ICD-10-CM | POA: Insufficient documentation

## 2011-12-10 DIAGNOSIS — F17200 Nicotine dependence, unspecified, uncomplicated: Secondary | ICD-10-CM | POA: Insufficient documentation

## 2011-12-10 DIAGNOSIS — R109 Unspecified abdominal pain: Secondary | ICD-10-CM

## 2011-12-10 LAB — CBC
HCT: 42.5 % (ref 36.0–46.0)
Hemoglobin: 14.8 g/dL (ref 12.0–15.0)
MCH: 32.6 pg (ref 26.0–34.0)
MCHC: 34.8 g/dL (ref 30.0–36.0)
MCV: 93.6 fL (ref 78.0–100.0)
RBC: 4.54 MIL/uL (ref 3.87–5.11)

## 2011-12-10 LAB — URINALYSIS, ROUTINE W REFLEX MICROSCOPIC
Glucose, UA: NEGATIVE mg/dL
Leukocytes, UA: NEGATIVE
Nitrite: NEGATIVE
Protein, ur: NEGATIVE mg/dL
pH: 6 (ref 5.0–8.0)

## 2011-12-10 LAB — WET PREP, GENITAL
Trich, Wet Prep: NONE SEEN
Yeast Wet Prep HPF POC: NONE SEEN

## 2011-12-10 LAB — DIFFERENTIAL
Basophils Relative: 0 % (ref 0–1)
Eosinophils Absolute: 0.2 10*3/uL (ref 0.0–0.7)
Eosinophils Relative: 2 % (ref 0–5)
Lymphs Abs: 3.2 10*3/uL (ref 0.7–4.0)
Monocytes Absolute: 0.6 10*3/uL (ref 0.1–1.0)
Monocytes Relative: 7 % (ref 3–12)
Neutrophils Relative %: 49 % (ref 43–77)

## 2011-12-10 LAB — URINE MICROSCOPIC-ADD ON

## 2011-12-10 LAB — BASIC METABOLIC PANEL
CO2: 26 mEq/L (ref 19–32)
Calcium: 9.6 mg/dL (ref 8.4–10.5)
Creatinine, Ser: 0.7 mg/dL (ref 0.50–1.10)
GFR calc Af Amer: >90 mL/min (ref >90)
GFR calc non Af Amer: >90 mL/min (ref >90)
Sodium: 139 mEq/L (ref 135–145)

## 2011-12-10 MED ORDER — TRAMADOL HCL 50 MG PO TABS: 50.0000 mg | ORAL_TABLET | Freq: Four times a day (QID) | ORAL | Status: AC | PRN

## 2011-12-10 MED ORDER — OXYCODONE-ACETAMINOPHEN 5-325 MG PO TABS
1.0000 | ORAL_TABLET | Freq: Once | ORAL | Status: AC
  Administered 2011-12-10: 1 via ORAL
  Filled 2011-12-10: qty 1

## 2011-12-10 MED ORDER — METRONIDAZOLE 500 MG PO TABS: 500.0000 mg | ORAL_TABLET | Freq: Two times a day (BID) | ORAL | Status: AC

## 2011-12-10 NOTE — ED Notes (Signed)
Pt describes dysuria, low abd and back pain since yesterday.

## 2011-12-10 NOTE — ED Notes (Signed)
D/c home- EDP Webb in to discuss f/u care- rx x 2 e-prescribed to [facility], pt aware

## 2011-12-10 NOTE — Discharge Instructions (Signed)
Bacterial Vaginosis Bacterial vaginosis (BV) is a vaginal infection where the normal balance of bacteria in the vagina is disrupted. The normal balance is then replaced by an overgrowth of certain bacteria. There are several different kinds of bacteria that can cause BV. BV is the most common vaginal infection in women of childbearing age. CAUSES   The cause of BV is not fully understood. BV develops when there is an increase or imbalance of harmful bacteria.   Some activities or behaviors can upset the normal balance of bacteria in the vagina and put women at increased risk including:   Having a new sex partner or multiple sex partners.   Douching.   Using an intrauterine device (IUD) for contraception.   It is not clear what role sexual activity plays in the development of BV. However, women that have never had sexual intercourse are rarely infected with BV.  Women do not get BV from toilet seats, bedding, swimming pools or from touching objects around them.  SYMPTOMS   Grey vaginal discharge.   A fish-like odor with discharge, especially after sexual intercourse.   Itching or burning of the vagina and vulva.   Burning or pain with urination.   Some women have no signs or symptoms at all.  DIAGNOSIS  Your caregiver must examine the vagina for signs of BV. Your caregiver will perform lab tests and look at the sample of vaginal fluid through a microscope. They will look for bacteria and abnormal cells (clue cells), a pH test higher than 4.5, and a positive amine test all associated with BV.  RISKS AND COMPLICATIONS   Pelvic inflammatory disease (PID).   Infections following gynecology surgery.   Developing HIV.   Developing herpes virus.  TREATMENT  Sometimes BV will clear up without treatment. However, all women with symptoms of BV should be treated to avoid complications, especially if gynecology surgery is planned. Female partners generally do not need to be treated. However,  BV may spread between female sex partners so treatment is helpful in preventing a recurrence of BV.   BV may be treated with antibiotics. The antibiotics come in either pill or vaginal cream forms. Either can be used with nonpregnant or pregnant women, but the recommended dosages differ. These antibiotics are not harmful to the baby.   BV can recur after treatment. If this happens, a second round of antibiotics will often be prescribed.   Treatment is important for pregnant women. If not treated, BV can cause a premature delivery, especially for a pregnant woman who had a premature birth in the past. All pregnant women who have symptoms of BV should be checked and treated.   For chronic reoccurrence of BV, treatment with a type of prescribed gel vaginally twice a week is helpful.  HOME CARE INSTRUCTIONS   Finish all medication as directed by your caregiver.   Do not have sex until treatment is completed.   Tell your sexual partner that you have a vaginal infection. They should see their caregiver and be treated if they have problems, such as a mild rash or itching.   Practice safe sex. Use condoms. Only have 1 sex partner.  PREVENTION  Basic prevention steps can help reduce the risk of upsetting the natural balance of bacteria in the vagina and developing BV:  Do not have sexual intercourse (be abstinent).   Do not douche.   Use all of the medicine prescribed for treatment of BV, even if the signs and symptoms go away.     Tell your sex partner if you have BV. That way, they can be treated, if needed, to prevent reoccurrence.  SEEK MEDICAL CARE IF:   Your symptoms are not improving after 3 days of treatment.   You have increased discharge, pain, or fever.  MAKE SURE YOU:   Understand these instructions.   Will watch your condition.   Will get help right away if you are not doing well or get worse.  FOR MORE INFORMATION  Division of STD Prevention (DSTDP), Centers for Disease  Control and Prevention: SolutionApps.co.za American Social Health Association (ASHA): www.ashastd.org  Document Released: 06/13/2005 Document Revised: 06/02/2011 Document Reviewed: 12/04/2008 [facility] Patient Information 2012 [place], [place].   Abdominal Pain Abdominal pain can be caused by many things. Your caregiver decides the seriousness of your pain by an examination and possibly blood tests and X-rays. Many cases can be observed and treated at home. Most abdominal pain is not caused by a disease and will probably improve without treatment. However, in many cases, more time must pass before a clear cause of the pain can be found. Before that point, it may not be known if you need more testing, or if hospitalization or surgery is needed. HOME CARE INSTRUCTIONS   Do not take laxatives unless directed by your caregiver.   Take pain medicine only as directed by your caregiver.   Only take over-the-counter or prescription medicines for pain, discomfort, or fever as directed by your caregiver.   Try a clear liquid diet (broth, tea, or water) for as long as directed by your caregiver. Slowly move to a bland diet as tolerated.  SEEK IMMEDIATE MEDICAL CARE IF:   The pain does not go away.   You have a fever.   You keep throwing up (vomiting).   The pain is felt only in portions of the abdomen. Pain in the right side could possibly be appendicitis. In an adult, pain in the left lower portion of the abdomen could be colitis or diverticulitis.   You pass bloody or black tarry stools.  MAKE SURE YOU:   Understand these instructions.   Will watch your condition.   Will get help right away if you are not doing well or get worse.  Document Released: 03/23/2005 Document Revised: 06/02/2011 Document Reviewed: 01/30/2008 [facility] Patient Information 2012 [place], [place].  RESOURCE GUIDE  Dental Problems  Patients with Medicaid: [facility] [phone] [address]                                           [phone] [address] Phone:  [phone]                                                   Phone:  [phone]  If unable to pay or uninsured, contact:  [facility] or [facility]. to become qualified for the adult dental clinic.  Chronic Pain Problems Contact [facility]  [phone] Patients need to be referred by their primary care doctor.  Insufficient Money for Medicine Contact [employer]:  call "211" or [facility] [phone].  No Primary Care Doctor Call [facility]  [phone] Other agencies that provide inexpensive medical care    [facility]  [phone]    [facility]  [phone]    [facility]  [phone]    [facility]  [phone]    [facility]  [phone]    [facility]  [phone]  Psychological Services [facility]  [phone] [facility]  [phone] [facility]   [phone] (emergency services [phone])  Abuse/Neglect [facility] Child Abuse Hotline [phone] [facility] Child Abuse Hotline [phone] (After Hours)  Emergency Shelter [facility] [phone]  [facility] [phone] [facility] [phone]  MRSA Hotline #:   (706)245-4923    [facility] Resources  [facility]  [employer]                           [facility] Dept. [address] [place]                     [address]         [address]  [place]ell Reveal Phone:  [phone]                                  Phone:  [phone]                   Phone:  [phone]  [facility] Mental Health Phone:  [phone]  [facility] Child Abuse Hotline (250)313-8832 [phone] (After Hours)

## 2011-12-10 NOTE — ED Provider Notes (Signed)
History   This chart was scribed for Forbes Cellar, MD by Melba Coon. The patient was seen in room MH11/MH11 and the patient's care was started at 6:35PM.    CSN: [id number]  Arrival date & time 12/10/11  1735   First MD Initiated Contact with Patient 12/10/11 1822      Chief Complaint  Patient presents with  . Urinary Retention    (Consider location/radiation/quality/duration/timing/severity/associated sxs/prior treatment) HPI Sabrina Andrews is a 31 y.o. female who presents to the Emergency Department complaining of constant, moderate to severe lower abdominal pain with associated back pain and dysuria pertaining to urinary retention with an onset yesterday. Pt came into the ED because she thought she had a UTI. Pt has never experienced similar symptoms in the past. Sitting down aggravates the symptoms. Nausea and diarrhea present. No HA, fever, neck pain, sore throat, rash, back pain, CP, SOB, abd pain, vomit, dysuria, or extremity pain, edema, weakness, numbness, or tingling. Hx of cholecystectomy, C-section, abd hysterectomy (Hx of HPV) and appendectomy. Allergic to ibuprofen and NSAIDs. No other pertinent medical symptoms. Pt is a current smoker.  Past Medical History  Diagnosis Date  . HPV (human papilloma virus) infection   . S/P tubal ligation     Past Surgical History  Procedure Date  . Cholecystectomy   . Cesarean section   . Abdominal hysterectomy   . Appendectomy     No family history on file.  History  Substance Use Topics  . Smoking status: Current Everyday Smoker -- 0.5 packs/day    Types: Cigarettes  . Smokeless tobacco: Not on file  . Alcohol Use: No    OB History    Grav Para Term Preterm Abortions TAB SAB Ect Mult Living   3 1 1              Review of Systems  All other systems reviewed and are negative.   10 Systems reviewed and all are negative for acute change except as noted in the HPI.   Allergies  Ibuprofen and Nsaids  Home  Medications   Current Outpatient Rx  Name Route Sig Dispense Refill  . ACETAMINOPHEN 500 MG PO TABS Oral Take 1,000 mg by mouth every 6 (six) hours as needed. For pain    . METRONIDAZOLE 500 MG PO TABS Oral Take 1 tablet (500 mg total) by mouth 2 (two) times daily. 14 tablet 0  . TRAMADOL HCL 50 MG PO TABS Oral Take 1 tablet (50 mg total) by mouth every 6 (six) hours as needed for pain. 15 tablet 0    BP 122/87  Pulse 87  Temp 97 F (36.1 C) (Oral)  Resp 20  Ht 5\' 2"  (1.575 m)  Wt 160 lb (72.576 kg)  BMI 29.26 kg/m2  SpO2 98%  LMP 07/29/2011  Physical Exam  Nursing note and vitals reviewed. Constitutional: She is oriented to person, place, and time. She appears well-developed and well-nourished.       Awake, alert, nontoxic appearance.  HENT:  Head: Normocephalic and atraumatic.  Eyes: EOM are normal. Right eye exhibits no discharge. Left eye exhibits no discharge.  Neck: Normal range of motion. Neck supple.  Cardiovascular: Normal rate, regular rhythm and normal heart sounds.   No murmur heard. Pulmonary/Chest: Effort normal and breath sounds normal. She has no wheezes. She exhibits no tenderness.  Abdominal: Soft. Bowel sounds are normal. She exhibits no distension and no mass. There is tenderness (diffuse abd tenderness greater in lower abd). There is  no rebound and no guarding.  Genitourinary: Vaginal discharge found.       White vaginal discharge  Musculoskeletal: Normal range of motion. She exhibits no tenderness.       Baseline ROM, no obvious new focal weakness.  Neurological: She is alert and oriented to person, place, and time.       Mental status and motor strength appears baseline for patient and situation.  Skin: Skin is warm. No rash noted.  Psychiatric: She has a normal mood and affect. Her behavior is normal.    ED Course  Procedures (including critical care time)  DIAGNOSTIC STUDIES: Oxygen Saturation is 98% on room air, normal by my interpretation.     COORDINATION OF CARE:  6:48PM - EDMD will order foley catheter, UA and kidney function tests for the pt.   Labs Reviewed  URINALYSIS, ROUTINE W REFLEX MICROSCOPIC - Abnormal; Notable for the following:    APPearance TURBID (*)     All other components within normal limits  URINE MICROSCOPIC-ADD ON - Abnormal; Notable for the following:    Squamous Epithelial / LPF MANY (*)     Bacteria, UA MANY (*)     All other components within normal limits  BASIC METABOLIC PANEL - Abnormal; Notable for the following:    Glucose, Bld 104 (*)     All other components within normal limits  WET PREP, GENITAL - Abnormal; Notable for the following:    Clue Cells Wet Prep HPF POC MODERATE (*)     All other components within normal limits  CBC  DIFFERENTIAL  GC/CHLAMYDIA PROBE AMP, GENITAL   No results found.   1. Abdominal pain   2. Bacterial vaginosis       MDM  Well appearing pw c/o decreased urination. SHe urinated here. Subsequent folate cath demonstrated 90-100 initial cc of urine output. Labs unremarkable including normal renal function. Wet prep with clue cells indicating possible bacterial vaginosis. In light of her new discharge will treat with Flagyl. The shunt appears quite well although she continues asked her narcotic pain medication. She was given 2 Percocet emergency department. I do not suspect an acute abdominal finding at this time. A do not believe she has indication for CT abdomen pelvis at this time. She's had a history of cholecystectomy, appendectomy, hysterectomy. She has no diarrhea or other concerns for colitis. He will be discharged to follow with her primary care Dr. as needed. No EMC precluding discharge at this time. Given Precautions for return. PMD f/u.  I personally performed the services described in this documentation, which was scribed in my presence. The recorded information has been reviewed and considered.       Forbes Cellar, MD 12/10/11 2235

## 2011-12-12 LAB — GC/CHLAMYDIA PROBE AMP, GENITAL: GC Probe Amp, Genital: NEGATIVE

## 2012-02-03 ENCOUNTER — Encounter (HOSPITAL_BASED_OUTPATIENT_CLINIC_OR_DEPARTMENT_OTHER): Payer: Self-pay | Admitting: Emergency Medicine

## 2012-02-03 ENCOUNTER — Emergency Department (HOSPITAL_BASED_OUTPATIENT_CLINIC_OR_DEPARTMENT_OTHER)
Admission: EM | Admit: 2012-02-03 | Discharge: 2012-02-03 | Disposition: A | Discharge status: Home / Self Care | Payer: Medicaid Other | Attending: Emergency Medicine | Admitting: Emergency Medicine

## 2012-02-03 DIAGNOSIS — K08109 Complete loss of teeth, unspecified cause, unspecified class: Secondary | ICD-10-CM | POA: Insufficient documentation

## 2012-02-03 DIAGNOSIS — K08409 Partial loss of teeth, unspecified cause, unspecified class: Secondary | ICD-10-CM | POA: Insufficient documentation

## 2012-02-03 DIAGNOSIS — G8918 Other acute postprocedural pain: Secondary | ICD-10-CM | POA: Insufficient documentation

## 2012-02-03 DIAGNOSIS — F17200 Nicotine dependence, unspecified, uncomplicated: Secondary | ICD-10-CM | POA: Insufficient documentation

## 2012-02-03 MED ORDER — PENICILLIN V POTASSIUM 500 MG PO TABS: 500.0000 mg | ORAL_TABLET | Freq: Four times a day (QID) | ORAL | Status: AC

## 2012-02-03 MED ORDER — HYDROCODONE-ACETAMINOPHEN 5-325 MG PO TABS
1.0000 | ORAL_TABLET | Freq: Four times a day (QID) | ORAL | Status: DC | PRN
Start: 2012-02-03 — End: 2012-02-12

## 2012-02-03 NOTE — ED Notes (Signed)
Pt had tooth removed 1 week ago.  Sts it started hurting more yesterday.  Much worse this evening. No one on call for her oral surgeon tonight. Reports bad taste in mouth and hx of dry socket.

## 2012-02-03 NOTE — ED Provider Notes (Signed)
History     CSN: [id number]  Arrival date & time 02/03/12  2102   First MD Initiated Contact with Patient 02/03/12 2155      Chief Complaint  Patient presents with  . Dental Pain    (Consider location/radiation/quality/duration/timing/severity/associated sxs/prior treatment) HPI This is a 31 year old white female who had a left lower premolar extracted about a week ago. She been doing fine until yesterday. It became more painful yesterday and has continued to increase in pain. She states there is a bad taste coming from it and it feels as if the socket is filled with slime. There is tenderness of the adjacent jaw. Pain is worse with eating and she has not had much to eat as a result of worsening pain. She's not had a fever. She was not placed on antibiotic. She was given only Tylenol #3 for pain which she states causes itching and nausea and she has not been able to take it.  Past Medical History  Diagnosis Date  . HPV (human papilloma virus) infection   . S/P tubal ligation     Past Surgical History  Procedure Date  . Cholecystectomy   . Cesarean section   . Abdominal hysterectomy   . Appendectomy     No family history on file.  History  Substance Use Topics  . Smoking status: Current Everyday Smoker -- 0.5 packs/day    Types: Cigarettes  . Smokeless tobacco: Not on file  . Alcohol Use: No    OB History    Grav Para Term Preterm Abortions TAB SAB Ect Mult Living   3 1 1              Review of Systems  All other systems reviewed and are negative.    Allergies  Ibuprofen and Nsaids  Home Medications   Current Outpatient Rx  Name Route Sig Dispense Refill  . ACETAMINOPHEN 500 MG PO TABS Oral Take 1,000 mg by mouth every 6 (six) hours as needed. For pain      BP 139/91  Pulse 93  Temp 98.3 F (36.8 C) (Oral)  Resp 16  Ht 5\' 2"  (1.575 m)  Wt 165 lb (74.844 kg)  BMI 30.18 kg/m2  SpO2 98%  LMP 07/29/2011  Physical Exam General: Well-developed,  well-nourished female in no acute distress; appearance consistent with age of record HENT: normocephalic, atraumatic; left lower premolar extraction socket with tenderness material, no bleeding or dry socket seen; tenderness to percussion adjacent mandible without swelling Eyes: pupils equal round and reactive to light; extraocular muscles intact Neck: supple Heart: regular rate and rhythm Lungs: Normal respiratory effort and excursion Abdomen: soft; nondistended Extremities: No deformity; full range of motion Neurologic: Awake, alert and oriented; motor function intact in all extremities and symmetric; no facial droop Skin: Warm and dry Psychiatric: Normal mood and affect    ED Course  Procedures (including critical care time)     MDM          Hanley Seamen, MD 02/03/12 2216

## 2012-02-12 ENCOUNTER — Encounter (HOSPITAL_COMMUNITY): Payer: Self-pay | Admitting: Emergency Medicine

## 2012-02-12 ENCOUNTER — Emergency Department (HOSPITAL_COMMUNITY)
Admission: EM | Admit: 2012-02-12 | Discharge: 2012-02-12 | Disposition: A | Discharge status: Home / Self Care | Payer: Medicaid Other | Attending: Emergency Medicine | Admitting: Emergency Medicine

## 2012-02-12 ENCOUNTER — Emergency Department (HOSPITAL_COMMUNITY): Payer: Medicaid Other

## 2012-02-12 DIAGNOSIS — IMO0002 Reserved for concepts with insufficient information to code with codable children: Secondary | ICD-10-CM | POA: Insufficient documentation

## 2012-02-12 DIAGNOSIS — F17200 Nicotine dependence, unspecified, uncomplicated: Secondary | ICD-10-CM | POA: Insufficient documentation

## 2012-02-12 DIAGNOSIS — S1093XA Contusion of unspecified part of neck, initial encounter: Secondary | ICD-10-CM

## 2012-02-12 DIAGNOSIS — S0003XA Contusion of scalp, initial encounter: Secondary | ICD-10-CM | POA: Insufficient documentation

## 2012-02-12 HISTORY — DX: Unspecified asthma, uncomplicated: J45.909

## 2012-02-12 MED ORDER — OXYCODONE HCL 10 MG PO TABS: 5.0000 mg | ORAL_TABLET | Freq: Four times a day (QID) | ORAL | Status: AC | PRN

## 2012-02-12 MED ORDER — OXYCODONE HCL 5 MG PO TABS
10.0000 mg | ORAL_TABLET | Freq: Once | ORAL | Status: AC
  Administered 2012-02-12: 10 mg via ORAL
  Filled 2012-02-12: qty 2
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
## 2012-02-12 NOTE — ED Notes (Signed)
The patient is AOx4 and comfortable with her discharge instructions. 
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
## 2012-02-13 NOTE — ED Provider Notes (Signed)
Medical screening examination/treatment/procedure(s) were performed by non-physician practitioner and as supervising physician I was immediately available for consultation/collaboration.   Bohdan Macho Y. Kassity Woodson, MD 02/13/12 1551 

## 2012-02-19 ENCOUNTER — Emergency Department (HOSPITAL_COMMUNITY)
Admission: EM | Admit: 2012-02-19 | Discharge: 2012-02-19 | Disposition: A | Discharge status: Home / Self Care | Payer: Medicaid Other | Attending: Emergency Medicine | Admitting: Emergency Medicine

## 2012-02-19 ENCOUNTER — Encounter (HOSPITAL_COMMUNITY): Payer: Self-pay | Admitting: Emergency Medicine

## 2012-02-19 ENCOUNTER — Emergency Department (HOSPITAL_COMMUNITY): Payer: Medicaid Other

## 2012-02-19 DIAGNOSIS — S1093XA Contusion of unspecified part of neck, initial encounter: Secondary | ICD-10-CM

## 2012-02-19 DIAGNOSIS — Z9089 Acquired absence of other organs: Secondary | ICD-10-CM | POA: Insufficient documentation

## 2012-02-19 DIAGNOSIS — IMO0002 Reserved for concepts with insufficient information to code with codable children: Secondary | ICD-10-CM | POA: Insufficient documentation

## 2012-02-19 DIAGNOSIS — S0003XA Contusion of scalp, initial encounter: Secondary | ICD-10-CM | POA: Insufficient documentation

## 2012-02-19 DIAGNOSIS — J45909 Unspecified asthma, uncomplicated: Secondary | ICD-10-CM | POA: Insufficient documentation

## 2012-02-19 DIAGNOSIS — F17200 Nicotine dependence, unspecified, uncomplicated: Secondary | ICD-10-CM | POA: Insufficient documentation

## 2012-02-19 DIAGNOSIS — M26609 Unspecified temporomandibular joint disorder, unspecified side: Secondary | ICD-10-CM | POA: Insufficient documentation

## 2012-02-19 MED ORDER — OXYCODONE-ACETAMINOPHEN 5-325 MG PO TABS
1.0000 | ORAL_TABLET | Freq: Once | ORAL | Status: AC
  Administered 2012-02-19: 1 via ORAL
  Filled 2012-02-19: qty 1

## 2012-02-19 MED ORDER — OXYCODONE-ACETAMINOPHEN 5-325 MG PO TABS: 1.0000 | ORAL_TABLET | Freq: Four times a day (QID) | ORAL | Status: AC | PRN
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
## 2012-02-27 ENCOUNTER — Encounter (HOSPITAL_BASED_OUTPATIENT_CLINIC_OR_DEPARTMENT_OTHER): Payer: Self-pay

## 2012-02-27 ENCOUNTER — Emergency Department (HOSPITAL_BASED_OUTPATIENT_CLINIC_OR_DEPARTMENT_OTHER)
Admission: EM | Admit: 2012-02-27 | Discharge: 2012-02-27 | Disposition: A | Discharge status: Home / Self Care | Payer: Medicaid Other | Attending: Emergency Medicine | Admitting: Emergency Medicine

## 2012-02-27 DIAGNOSIS — R1110 Vomiting, unspecified: Secondary | ICD-10-CM | POA: Insufficient documentation

## 2012-02-27 DIAGNOSIS — R319 Hematuria, unspecified: Secondary | ICD-10-CM | POA: Insufficient documentation

## 2012-02-27 DIAGNOSIS — R109 Unspecified abdominal pain: Secondary | ICD-10-CM | POA: Insufficient documentation

## 2012-02-27 DIAGNOSIS — J45909 Unspecified asthma, uncomplicated: Secondary | ICD-10-CM | POA: Insufficient documentation

## 2012-02-27 DIAGNOSIS — R42 Dizziness and giddiness: Secondary | ICD-10-CM | POA: Insufficient documentation

## 2012-02-27 DIAGNOSIS — N760 Acute vaginitis: Secondary | ICD-10-CM | POA: Insufficient documentation

## 2012-02-27 DIAGNOSIS — Z79899 Other long term (current) drug therapy: Secondary | ICD-10-CM | POA: Insufficient documentation

## 2012-02-27 DIAGNOSIS — R197 Diarrhea, unspecified: Secondary | ICD-10-CM | POA: Insufficient documentation

## 2012-02-27 DIAGNOSIS — G8929 Other chronic pain: Secondary | ICD-10-CM | POA: Insufficient documentation

## 2012-02-27 DIAGNOSIS — B9689 Other specified bacterial agents as the cause of diseases classified elsewhere: Secondary | ICD-10-CM | POA: Insufficient documentation

## 2012-02-27 DIAGNOSIS — A499 Bacterial infection, unspecified: Secondary | ICD-10-CM | POA: Insufficient documentation

## 2012-02-27 DIAGNOSIS — N390 Urinary tract infection, site not specified: Secondary | ICD-10-CM | POA: Insufficient documentation

## 2012-02-27 DIAGNOSIS — A630 Anogenital (venereal) warts: Secondary | ICD-10-CM | POA: Insufficient documentation

## 2012-02-27 DIAGNOSIS — F17200 Nicotine dependence, unspecified, uncomplicated: Secondary | ICD-10-CM | POA: Insufficient documentation

## 2012-02-27 HISTORY — DX: Other specified postprocedural states: Z98.890

## 2012-02-27 HISTORY — DX: Maternal care for other abnormalities of cervix, unspecified trimester: O34.40

## 2012-02-27 HISTORY — DX: Dysplasia of vulva, unspecified: N90.3

## 2012-02-27 LAB — COMPREHENSIVE METABOLIC PANEL
ALT: 10 U/L (ref 0–35)
AST: 10 U/L (ref 0–37)
Albumin: 3.7 g/dL (ref 3.5–5.2)
Alkaline Phosphatase: 78 U/L (ref 39–117)
Chloride: 104 mEq/L (ref 96–112)
Potassium: 3.5 mEq/L (ref 3.5–5.1)
Sodium: 139 mEq/L (ref 135–145)
Total Protein: 6.7 g/dL (ref 6.0–8.3)

## 2012-02-27 LAB — URINALYSIS, ROUTINE W REFLEX MICROSCOPIC
Bilirubin Urine: NEGATIVE
Glucose, UA: NEGATIVE mg/dL
Protein, ur: 30 mg/dL — AB
Urobilinogen, UA: 1 mg/dL (ref 0.0–1.0)

## 2012-02-27 LAB — CBC WITH DIFFERENTIAL/PLATELET
Basophils Relative: 0 % (ref 0–1)
Eosinophils Absolute: 0.3 10*3/uL (ref 0.0–0.7)
Lymphs Abs: 2.6 10*3/uL (ref 0.7–4.0)
MCH: 32 pg (ref 26.0–34.0)
MCHC: 34.3 g/dL (ref 30.0–36.0)
Neutro Abs: 4.3 10*3/uL (ref 1.7–7.7)
Neutrophils Relative %: 56 % (ref 43–77)
Platelets: 279 10*3/uL (ref 150–400)
RBC: 4.65 MIL/uL (ref 3.87–5.11)

## 2012-02-27 LAB — URINE MICROSCOPIC-ADD ON

## 2012-02-27 LAB — WET PREP, GENITAL: Yeast Wet Prep HPF POC: NONE SEEN

## 2012-02-27 MED ORDER — LOPERAMIDE HCL 2 MG PO CAPS: ORAL_CAPSULE | ORAL | Status: AC

## 2012-02-27 MED ORDER — METOCLOPRAMIDE HCL 10 MG PO TABS: 10.0000 mg | ORAL_TABLET | Freq: Four times a day (QID) | ORAL | Status: DC | PRN

## 2012-02-27 MED ORDER — CEPHALEXIN 500 MG PO CAPS: ORAL_CAPSULE | ORAL | Status: AC

## 2012-02-27 MED ORDER — METRONIDAZOLE 500 MG PO TABS: 500.0000 mg | ORAL_TABLET | Freq: Two times a day (BID) | ORAL | Status: AC

## 2012-02-27 MED ORDER — SODIUM CHLORIDE 0.9 % IV SOLN: INTRAVENOUS | Status: DC

## 2012-02-27 MED ORDER — SODIUM CHLORIDE 0.9 % IV BOLUS (SEPSIS)
1000.0000 mL | Freq: Once | INTRAVENOUS | Status: AC
  Administered 2012-02-27: 1000 mL via INTRAVENOUS

## 2012-02-27 MED ORDER — FENTANYL CITRATE 0.05 MG/ML IJ SOLN
100.0000 ug | Freq: Once | INTRAMUSCULAR | Status: AC
  Administered 2012-02-27: 100 ug via INTRAVENOUS
  Filled 2012-02-27: qty 2

## 2012-02-27 MED ORDER — ONDANSETRON HCL 4 MG/2ML IJ SOLN
4.0000 mg | Freq: Once | INTRAMUSCULAR | Status: AC
  Administered 2012-02-27: 4 mg via INTRAVENOUS
  Filled 2012-02-27: qty 2

## 2012-02-27 MED ORDER — ONDANSETRON 8 MG PO TBDP: ORAL_TABLET | ORAL | Status: AC

## 2012-02-27 MED ORDER — HYDROCODONE-ACETAMINOPHEN 5-325 MG PO TABS: 2.0000 | ORAL_TABLET | Freq: Three times a day (TID) | ORAL | Status: AC | PRN

## 2012-02-27 NOTE — ED Notes (Signed)
Pt reports abdominal pain and vomiting that started yesterday.  She also reports vaginal d/c and a painful lesion on labia.

## 2012-02-27 NOTE — ED Notes (Signed)
The pelvic cart is set up at the bedside and ready for the doctor to use. 

## 2012-02-27 NOTE — ED Notes (Signed)
MD at bedside. 

## 2012-02-27 NOTE — ED Provider Notes (Signed)
History     CSN: [id number]  Arrival date & time 02/27/12  0906   First MD Initiated Contact with Patient 02/27/12 [phone]      Chief Complaint  Patient presents with  . Abdominal Pain  . Emesis  . Vaginal Discharge    (Consider location/radiation/quality/duration/timing/severity/associated sxs/prior treatment) HPI 31 year old female has chronic daily severe abdominal pain and pelvic pain with chronic painful genital warts everyday for several months with 2 unremarkable CT scans as well as unremarkable pelvic ultrasound earlier this year for the same problems. She returns to the emergency department with a two-day exacerbation of worse than baseline severe pain but it is pain like she has had before she has with exacerbations, she also has several episodes each of nonbloody vomiting and diarrhea since yesterday. She feels lightheaded and dehydrated today. She has scant vaginal discharge since yesterday. She has hematuria as well. She is no fever chest pain cough shortness breath. There is no rash and no altered mental status. Her genital warts are tender worse without pus drainage or redness around them. There is no treatment prior to arrival. Past Medical History  Diagnosis Date  . HPV (human papilloma virus) infection   . S/P tubal ligation   . Asthma   . H/O LEEP (loop electrosurgical excision procedure) of cervix complicating pregnancy   . Vulvar dysplasia    chronic abdominal pain, chronic pelvic pain, genital warts  Past Surgical History  Procedure Date  . Cholecystectomy   . Cesarean section   . Abdominal hysterectomy   . Appendectomy     No family history on file.  History  Substance Use Topics  . Smoking status: Current Everyday Smoker -- 0.5 packs/day    Types: Cigarettes  . Smokeless tobacco: Not on file  . Alcohol Use: No    OB History    Grav Para Term Preterm Abortions TAB SAB Ect Mult Living   3 1 1              Review of Systems 10 Systems reviewed and  are negative for acute change except as noted in the HPI. Allergies  Ibuprofen and Nsaids  Home Medications   Current Outpatient Rx  Name Route Sig Dispense Refill  . ACETAMINOPHEN 500 MG PO TABS Oral Take 1,000 mg by mouth every 6 (six) hours as needed. For pain    . CEPHALEXIN 500 MG PO CAPS  2 caps po bid x 7 days 28 capsule 0  . HYDROCODONE-ACETAMINOPHEN 5-325 MG PO TABS Oral Take 2 tablets by mouth every 8 (eight) hours as needed for pain. 10 tablet 0  . LOPERAMIDE HCL 2 MG PO CAPS  Take two tabs po initially, then one tab after each loose stool: max 8 tabs in 24 hours 12 capsule 0  . METOCLOPRAMIDE HCL 10 MG PO TABS Oral Take 1 tablet (10 mg total) by mouth every 6 (six) hours as needed (nausea/headache). 6 tablet 0  . METRONIDAZOLE 500 MG PO TABS Oral Take 1 tablet (500 mg total) by mouth 2 (two) times daily. One po bid x 7 days 14 tablet 0  . ONDANSETRON 8 MG PO TBDP  8mg  ODT q4 hours prn nausea 4 tablet 0    BP 130/83  Pulse 66  Temp 97.7 F (36.5 C) (Oral)  Resp 18  Ht 5' (1.524 m)  Wt 165 lb (74.844 kg)  BMI 32.22 kg/m2  SpO2 100%  LMP 07/29/2011  Physical Exam  Nursing note and vitals reviewed. Constitutional:  Awake, alert, nontoxic appearance.  HENT:  Head: Atraumatic.  Eyes: Right eye exhibits no discharge. Left eye exhibits no discharge.  Neck: Neck supple.  Cardiovascular: Normal rate and regular rhythm.   No murmur heard. Pulmonary/Chest: Effort normal and breath sounds normal. No respiratory distress. She has no wheezes. She has no rales. She exhibits no tenderness.  Abdominal: Soft. Bowel sounds are normal. She exhibits no mass. There is tenderness. There is no rebound and no guarding.       Mouth moderate diffuse tenderness without rebound  Genitourinary:       Chaperone present, mildly tender genital warts, no surrounding cellulitis or purulent drainage or fluctuance, scant if any white vaginal discharge, bimanual exam  tenderness to the pelvic  area with no probable masses  Musculoskeletal: She exhibits no tenderness.       Baseline ROM, no obvious new focal weakness.  Neurological:       Mental status and motor strength appears baseline for patient and situation.  Skin: No rash noted.  Psychiatric: She has a normal mood and affect.    ED Course  Procedures (including critical care time) Pt stable in ED with no significant deterioration in condition.Pt feels improved after observation and/or treatment in ED.Patient / Family / Caregiver informed of clinical course, understand medical decision-making process, and agree with plan. Labs Reviewed  URINALYSIS, ROUTINE W REFLEX MICROSCOPIC - Abnormal; Notable for the following:    Color, Urine RED (*)  BIOCHEMICALS MAY BE AFFECTED BY COLOR   APPearance CLOUDY (*)     Hgb urine dipstick LARGE (*)     Ketones, ur 15 (*)     Protein, ur 30 (*)     Leukocytes, UA SMALL (*)     All other components within normal limits  WET PREP, GENITAL - Abnormal; Notable for the following:    Clue Cells Wet Prep HPF POC FEW (*)     WBC, Wet Prep HPF POC FEW (*)     All other components within normal limits  COMPREHENSIVE METABOLIC PANEL - Abnormal; Notable for the following:    Total Bilirubin 0.2 (*)     All other components within normal limits  URINE MICROSCOPIC-ADD ON - Abnormal; Notable for the following:    Squamous Epithelial / LPF FEW (*)     Bacteria, UA MANY (*)     All other components within normal limits  GC/CHLAMYDIA PROBE AMP, GENITAL  CBC WITH DIFFERENTIAL  LIPASE, BLOOD  URINE CULTURE   No results found.   1. Abdominal pain   2. Chronic abdominal pain   3. Genital warts   4. Urinary tract infection   5. Bacterial vaginosis   6. Vomiting and diarrhea       MDM          Hurman Horn, MD 03/02/12 2241

## 2012-02-28 LAB — GC/CHLAMYDIA PROBE AMP, GENITAL
Chlamydia, DNA Probe: NEGATIVE
GC Probe Amp, Genital: NEGATIVE

## 2012-02-28 LAB — URINE CULTURE

## 2012-03-29 ENCOUNTER — Encounter (HOSPITAL_BASED_OUTPATIENT_CLINIC_OR_DEPARTMENT_OTHER): Payer: Self-pay | Admitting: Emergency Medicine

## 2012-03-29 ENCOUNTER — Emergency Department (HOSPITAL_BASED_OUTPATIENT_CLINIC_OR_DEPARTMENT_OTHER): Payer: Medicaid Other

## 2012-03-29 ENCOUNTER — Emergency Department (HOSPITAL_BASED_OUTPATIENT_CLINIC_OR_DEPARTMENT_OTHER)
Admission: EM | Admit: 2012-03-29 | Discharge: 2012-03-29 | Disposition: A | Discharge status: Home / Self Care | Payer: Medicaid Other | Attending: Emergency Medicine | Admitting: Emergency Medicine

## 2012-03-29 DIAGNOSIS — M542 Cervicalgia: Secondary | ICD-10-CM | POA: Insufficient documentation

## 2012-03-29 DIAGNOSIS — R079 Chest pain, unspecified: Secondary | ICD-10-CM | POA: Insufficient documentation

## 2012-03-29 DIAGNOSIS — M25519 Pain in unspecified shoulder: Secondary | ICD-10-CM | POA: Insufficient documentation

## 2012-03-29 DIAGNOSIS — IMO0002 Reserved for concepts with insufficient information to code with codable children: Secondary | ICD-10-CM | POA: Insufficient documentation

## 2012-03-29 DIAGNOSIS — S43409A Unspecified sprain of unspecified shoulder joint, initial encounter: Secondary | ICD-10-CM

## 2012-03-29 DIAGNOSIS — S2341XA Sprain of ribs, initial encounter: Secondary | ICD-10-CM | POA: Insufficient documentation

## 2012-03-29 MED ORDER — OXYCODONE-ACETAMINOPHEN 5-325 MG PO TABS: 1.0000 | ORAL_TABLET | Freq: Four times a day (QID) | ORAL | Status: DC | PRN

## 2012-03-29 MED ORDER — OXYCODONE-ACETAMINOPHEN 5-325 MG PO TABS
2.0000 | ORAL_TABLET | Freq: Once | ORAL | Status: AC
  Administered 2012-03-29: 2 via ORAL
  Filled 2012-03-29 (×2): qty 2
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
## 2012-03-29 NOTE — ED Notes (Signed)
Patient transported to X-ray 
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
## 2012-03-30 NOTE — ED Provider Notes (Signed)
Medical screening examination/treatment/procedure(s) were performed by non-physician practitioner and as supervising physician I was immediately available for consultation/collaboration.  Dorr Perrot, MD 03/30/12 1943 

## 2012-04-03 ENCOUNTER — Encounter (HOSPITAL_BASED_OUTPATIENT_CLINIC_OR_DEPARTMENT_OTHER): Payer: Self-pay | Admitting: *Deleted

## 2012-04-03 ENCOUNTER — Emergency Department (HOSPITAL_BASED_OUTPATIENT_CLINIC_OR_DEPARTMENT_OTHER)
Admission: EM | Admit: 2012-04-03 | Discharge: 2012-04-03 | Disposition: A | Discharge status: Home / Self Care | Payer: Medicaid Other | Attending: Emergency Medicine | Admitting: Emergency Medicine

## 2012-04-03 DIAGNOSIS — Z886 Allergy status to analgesic agent status: Secondary | ICD-10-CM | POA: Insufficient documentation

## 2012-04-03 DIAGNOSIS — J45909 Unspecified asthma, uncomplicated: Secondary | ICD-10-CM | POA: Insufficient documentation

## 2012-04-03 DIAGNOSIS — S20219A Contusion of unspecified front wall of thorax, initial encounter: Secondary | ICD-10-CM | POA: Insufficient documentation

## 2012-04-03 DIAGNOSIS — F17200 Nicotine dependence, unspecified, uncomplicated: Secondary | ICD-10-CM | POA: Insufficient documentation

## 2012-04-03 MED ORDER — HYDROCODONE-ACETAMINOPHEN 5-500 MG PO TABS: 1.0000 | ORAL_TABLET | Freq: Four times a day (QID) | ORAL | Status: DC | PRN

## 2012-04-03 MED ORDER — HYDROCODONE-ACETAMINOPHEN 5-325 MG PO TABS
1.0000 | ORAL_TABLET | Freq: Once | ORAL | Status: AC
  Administered 2012-04-03: 1 via ORAL
  Filled 2012-04-03: qty 1
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
## 2012-04-14 ENCOUNTER — Emergency Department (HOSPITAL_BASED_OUTPATIENT_CLINIC_OR_DEPARTMENT_OTHER)
Admission: EM | Admit: 2012-04-14 | Discharge: 2012-04-14 | Disposition: A | Discharge status: Home / Self Care | Payer: Medicaid Other | Attending: Emergency Medicine | Admitting: Emergency Medicine

## 2012-04-14 ENCOUNTER — Emergency Department (HOSPITAL_BASED_OUTPATIENT_CLINIC_OR_DEPARTMENT_OTHER): Payer: Medicaid Other

## 2012-04-14 ENCOUNTER — Encounter (HOSPITAL_BASED_OUTPATIENT_CLINIC_OR_DEPARTMENT_OTHER): Payer: Self-pay | Admitting: *Deleted

## 2012-04-14 DIAGNOSIS — Z79899 Other long term (current) drug therapy: Secondary | ICD-10-CM | POA: Insufficient documentation

## 2012-04-14 DIAGNOSIS — X500XXA Overexertion from strenuous movement or load, initial encounter: Secondary | ICD-10-CM | POA: Insufficient documentation

## 2012-04-14 DIAGNOSIS — M25519 Pain in unspecified shoulder: Secondary | ICD-10-CM | POA: Insufficient documentation

## 2012-04-14 MED ORDER — OXYCODONE-ACETAMINOPHEN 5-325 MG PO TABS
2.0000 | ORAL_TABLET | Freq: Once | ORAL | Status: AC
  Administered 2012-04-14: 2 via ORAL
  Filled 2012-04-14 (×2): qty 2

## 2012-04-14 NOTE — ED Provider Notes (Signed)
Medical screening examination/treatment/procedure(s) were performed by non-physician practitioner and as supervising physician I was immediately available for consultation/collaboration.   Pavlos Yon Y. Angelmarie Ponzo, MD 04/14/12 1552 
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
## 2012-08-26 ENCOUNTER — Encounter (HOSPITAL_BASED_OUTPATIENT_CLINIC_OR_DEPARTMENT_OTHER): Payer: Self-pay

## 2012-08-26 ENCOUNTER — Emergency Department (HOSPITAL_BASED_OUTPATIENT_CLINIC_OR_DEPARTMENT_OTHER)
Admission: EM | Admit: 2012-08-26 | Discharge: 2012-08-26 | Disposition: A | Discharge status: Home / Self Care | Payer: Medicaid Other | Attending: Emergency Medicine | Admitting: Emergency Medicine

## 2012-08-26 ENCOUNTER — Emergency Department (HOSPITAL_BASED_OUTPATIENT_CLINIC_OR_DEPARTMENT_OTHER): Payer: Medicaid Other

## 2012-08-26 DIAGNOSIS — F17200 Nicotine dependence, unspecified, uncomplicated: Secondary | ICD-10-CM | POA: Insufficient documentation

## 2012-08-26 DIAGNOSIS — Z8619 Personal history of other infectious and parasitic diseases: Secondary | ICD-10-CM | POA: Insufficient documentation

## 2012-08-26 DIAGNOSIS — N390 Urinary tract infection, site not specified: Secondary | ICD-10-CM | POA: Insufficient documentation

## 2012-08-26 DIAGNOSIS — Z9851 Tubal ligation status: Secondary | ICD-10-CM | POA: Insufficient documentation

## 2012-08-26 DIAGNOSIS — R319 Hematuria, unspecified: Secondary | ICD-10-CM

## 2012-08-26 DIAGNOSIS — J45909 Unspecified asthma, uncomplicated: Secondary | ICD-10-CM | POA: Insufficient documentation

## 2012-08-26 DIAGNOSIS — Z8742 Personal history of other diseases of the female genital tract: Secondary | ICD-10-CM | POA: Insufficient documentation

## 2012-08-26 DIAGNOSIS — Z90711 Acquired absence of uterus with remaining cervical stump: Secondary | ICD-10-CM | POA: Insufficient documentation

## 2012-08-26 DIAGNOSIS — R3915 Urgency of urination: Secondary | ICD-10-CM | POA: Insufficient documentation

## 2012-08-26 DIAGNOSIS — R112 Nausea with vomiting, unspecified: Secondary | ICD-10-CM | POA: Insufficient documentation

## 2012-08-26 DIAGNOSIS — R350 Frequency of micturition: Secondary | ICD-10-CM | POA: Insufficient documentation

## 2012-08-26 DIAGNOSIS — M549 Dorsalgia, unspecified: Secondary | ICD-10-CM | POA: Insufficient documentation

## 2012-08-26 LAB — COMPREHENSIVE METABOLIC PANEL
AST: 16 U/L (ref 0–37)
Albumin: 3.8 g/dL (ref 3.5–5.2)
BUN: 12 mg/dL (ref 6–23)
Calcium: 9.4 mg/dL (ref 8.4–10.5)
Creatinine, Ser: 0.8 mg/dL (ref 0.50–1.10)
GFR calc non Af Amer: >90 mL/min (ref >90)
Total Bilirubin: 0.2 mg/dL — ABNORMAL LOW (ref 0.3–1.2)

## 2012-08-26 LAB — CBC WITH DIFFERENTIAL/PLATELET
Basophils Absolute: 0 10*3/uL (ref 0.0–0.1)
Basophils Relative: 0 % (ref 0–1)
Eosinophils Relative: 2 % (ref 0–5)
HCT: 45 % (ref 36.0–46.0)
Hemoglobin: 15.5 g/dL — ABNORMAL HIGH (ref 12.0–15.0)
MCH: 32.9 pg (ref 26.0–34.0)
MCHC: 34.4 g/dL (ref 30.0–36.0)
MCV: 95.5 fL (ref 78.0–100.0)
Monocytes Absolute: 0.5 10*3/uL (ref 0.1–1.0)
Monocytes Relative: 7 % (ref 3–12)
RDW: 13.5 % (ref 11.5–15.5)

## 2012-08-26 LAB — URINALYSIS, ROUTINE W REFLEX MICROSCOPIC
Nitrite: NEGATIVE
Specific Gravity, Urine: 1.014 (ref 1.005–1.030)
Urobilinogen, UA: 0.2 mg/dL (ref 0.0–1.0)

## 2012-08-26 LAB — URINE MICROSCOPIC-ADD ON

## 2012-08-26 MED ORDER — CIPROFLOXACIN HCL 500 MG PO TABS: 500.0000 mg | ORAL_TABLET | Freq: Two times a day (BID) | ORAL | Status: DC

## 2012-08-26 MED ORDER — HYDROCODONE-ACETAMINOPHEN 5-325 MG PO TABS: 1.0000 | ORAL_TABLET | ORAL | Status: DC | PRN

## 2012-08-26 MED ORDER — ONDANSETRON HCL 4 MG/2ML IJ SOLN
4.0000 mg | Freq: Once | INTRAMUSCULAR | Status: AC
  Administered 2012-08-26: 4 mg via INTRAVENOUS

## 2012-08-26 MED ORDER — PHENAZOPYRIDINE HCL 200 MG PO TABS: 200.0000 mg | ORAL_TABLET | Freq: Three times a day (TID) | ORAL | Status: DC

## 2012-08-26 MED ORDER — ONDANSETRON HCL 4 MG/2ML IJ SOLN
INTRAMUSCULAR | Status: AC
  Administered 2012-08-26: 4 mg via INTRAVENOUS
  Filled 2012-08-26: qty 2

## 2012-08-26 MED ORDER — PROMETHAZINE HCL 25 MG PO TABS: 25.0000 mg | ORAL_TABLET | Freq: Four times a day (QID) | ORAL | Status: DC | PRN

## 2012-08-26 MED ORDER — OXYCODONE-ACETAMINOPHEN 5-325 MG PO TABS
1.0000 | ORAL_TABLET | Freq: Once | ORAL | Status: AC
  Administered 2012-08-26: 1 via ORAL
  Filled 2012-08-26 (×2): qty 1

## 2012-08-26 MED ORDER — HYDROMORPHONE HCL PF 1 MG/ML IJ SOLN
1.0000 mg | Freq: Once | INTRAMUSCULAR | Status: AC
  Administered 2012-08-26: 1 mg via INTRAVENOUS
  Filled 2012-08-26: qty 1

## 2012-08-26 MED ORDER — SODIUM CHLORIDE 0.9 % IV BOLUS (SEPSIS)
1000.0000 mL | Freq: Once | INTRAVENOUS | Status: AC
  Administered 2012-08-26: 1000 mL via INTRAVENOUS
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
## 2012-08-26 NOTE — ED Notes (Signed)
States vomiting, lower back and abd pain. Also that she has had blood in her urine.

## 2012-08-26 NOTE — ED Provider Notes (Signed)
History     CSN: [id number]  Arrival date & time 08/26/12  1104   First MD Initiated Contact with Patient 08/26/12 1141      Chief Complaint  Patient presents with  . Abdominal Pain    (Consider location/radiation/quality/duration/timing/severity/associated sxs/prior Treatment)  HPI Sabrina Andrews is a 32 y.o. female who presents to the ED with abdominal pain. This is a new problem. The pain is located in the right lower abdomen and radiates to the right flank area. She describes the pain as sharp, stabbing, constant. The pain is similar to labor pains. She rates the pain as 9/10. The onset was gradual and started yesterday. Associated symptoms include nausea, vomiting, difficulty voiding and hematuria.  She denies vaginal discharge. She has had a hysterectomy but her ovaries remain. The history was provided by the patient.  Past Medical History  Diagnosis Date  . HPV (human papilloma virus) infection   . S/P tubal ligation   . Asthma   . H/O LEEP (loop electrosurgical excision procedure) of cervix complicating pregnancy   . Vulvar dysplasia     Past Surgical History  Procedure Laterality Date  . Cholecystectomy    . Cesarean section    . Abdominal hysterectomy    . Appendectomy      No family history on file.  History  Substance Use Topics  . Smoking status: Current Every Day Smoker -- 0.50 packs/day    Types: Cigarettes  . Smokeless tobacco: Never Used  . Alcohol Use: No    OB History   Grav Para Term Preterm Abortions TAB SAB Ect Mult Living   3 1 1              Review of Systems  Constitutional: Negative for fever, chills, diaphoresis and fatigue.  HENT: Negative for ear pain, congestion, sore throat, facial swelling, neck pain, neck stiffness, dental problem and sinus pressure.   Eyes: Negative for photophobia, pain and discharge.  Respiratory: Negative for cough, chest tightness and wheezing.   Cardiovascular: Negative for chest pain and palpitations.   Gastrointestinal: Positive for nausea, vomiting and abdominal pain. Negative for diarrhea, constipation and abdominal distention.  Genitourinary: Positive for urgency, frequency, flank pain and difficulty urinating. Negative for dysuria.  Musculoskeletal: Positive for back pain. Negative for myalgias and gait problem.  Skin: Negative for color change and rash.  Neurological: Negative for dizziness, speech difficulty, weakness, light-headedness, numbness and headaches.  Psychiatric/Behavioral: Negative for confusion and agitation. The patient is not nervous/anxious.     Allergies  Ibuprofen and Nsaids  Home Medications   Current Outpatient Rx  Name  Route  Sig  Dispense  Refill  . acetaminophen (TYLENOL) 500 MG tablet   Oral   Take 1,000 mg by mouth every 6 (six) hours as needed. For pain         . HYDROcodone-acetaminophen (VICODIN) 5-500 MG per tablet   Oral   Take 1-2 tablets by mouth every 6 (six) hours as needed for pain.   10 tablet   0   . EXPIRED: metoCLOPramide (REGLAN) 10 MG tablet   Oral   Take 1 tablet (10 mg total) by mouth every 6 (six) hours as needed (nausea/headache).   6 tablet   0   . oxyCODONE-acetaminophen (PERCOCET) 5-325 MG per tablet   Oral   Take 1-2 tablets by mouth every 6 (six) hours as needed for pain.   15 tablet   0     BP 125/71  Temp(Src) 97.7 F (  36.5 C) (Oral)  Resp 20  SpO2 99%  LMP 07/29/2011  Physical Exam  Nursing note and vitals reviewed. Constitutional: She is oriented to person, place, and time. She appears well-developed and well-nourished. No distress.  Appears uncomfortable.  HENT:  Head: Normocephalic and atraumatic.  Eyes: EOM are normal. Pupils are equal, round, and reactive to light.  Neck: Neck supple.  Cardiovascular: Normal rate and regular rhythm.   Pulmonary/Chest: Effort normal and breath sounds normal.  Abdominal: Soft. Normal appearance and bowel sounds are normal. There is tenderness in the right  upper quadrant, right lower quadrant, suprapubic area and left lower quadrant. There is no rigidity, no rebound, no guarding and no CVA tenderness.  Musculoskeletal: Normal range of motion. She exhibits no edema.  Neurological: She is alert and oriented to person, place, and time. No cranial nerve deficit.  Skin: Skin is warm and dry.  Psychiatric: She has a normal mood and affect. Her behavior is normal. Judgment and thought content normal.   Procedures  Results for orders placed during the hospital encounter of 08/26/12 (from the past 24 hour(s))  URINALYSIS, ROUTINE W REFLEX MICROSCOPIC     Status: Abnormal   Collection Time    08/26/12 11:21 AM      Result Value Range   Color, Urine YELLOW  YELLOW   APPearance CLOUDY (*) CLEAR   Specific Gravity, Urine 1.014  1.005 - 1.030   pH 6.0  5.0 - 8.0   Glucose, UA NEGATIVE  NEGATIVE mg/dL   Hgb urine dipstick LARGE (*) NEGATIVE   Bilirubin Urine NEGATIVE  NEGATIVE   Ketones, ur NEGATIVE  NEGATIVE mg/dL   Protein, ur NEGATIVE  NEGATIVE mg/dL   Urobilinogen, UA 0.2  0.0 - 1.0 mg/dL   Nitrite NEGATIVE  NEGATIVE   Leukocytes, UA TRACE (*) NEGATIVE  URINE MICROSCOPIC-ADD ON     Status: Abnormal   Collection Time    08/26/12 11:21 AM      Result Value Range   Squamous Epithelial / LPF MANY (*) RARE   WBC, UA 0-2  <3 WBC/hpf   RBC / HPF TOO NUMEROUS TO COUNT  <3 RBC/hpf   Bacteria, UA FEW (*) RARE   Urine-Other LESS THAN 10 mL OF URINE SUBMITTED    CBC WITH DIFFERENTIAL     Status: Abnormal   Collection Time    08/26/12 11:39 AM      Result Value Range   WBC 7.2  4.0 - 10.5 K/uL   RBC 4.71  3.87 - 5.11 MIL/uL   Hemoglobin 15.5 (*) 12.0 - 15.0 g/dL   HCT 16.1  09.6 - 04.5 %   MCV 95.5  78.0 - 100.0 fL   MCH 32.9  26.0 - 34.0 pg   MCHC 34.4  30.0 - 36.0 g/dL   RDW 40.9  81.1 - 91.4 %   Platelets 262  150 - 400 K/uL   Neutrophils Relative 50  43 - 77 %   Neutro Abs 3.6  1.7 - 7.7 K/uL   Lymphocytes Relative 41  12 - 46 %   Lymphs  Abs 2.9  0.7 - 4.0 K/uL   Monocytes Relative 7  3 - 12 %   Monocytes Absolute 0.5  0.1 - 1.0 K/uL   Eosinophils Relative 2  0 - 5 %   Eosinophils Absolute 0.2  0.0 - 0.7 K/uL   Basophils Relative 0  0 - 1 %   Basophils Absolute 0.0  0.0 - 0.1 K/uL  COMPREHENSIVE METABOLIC PANEL     Status: Abnormal   Collection Time    08/26/12 11:39 AM      Result Value Range   Sodium 137  135 - 145 mEq/L   Potassium 4.1  3.5 - 5.1 mEq/L   Chloride 101  96 - 112 mEq/L   CO2 25  19 - 32 mEq/L   Glucose, Bld 99  70 - 99 mg/dL   BUN 12  6 - 23 mg/dL   Creatinine, Ser 4.78  0.50 - 1.10 mg/dL   Calcium 9.4  8.4 - 29.5 mg/dL   Total Protein 7.0  6.0 - 8.3 g/dL   Albumin 3.8  3.5 - 5.2 g/dL   AST 16  0 - 37 U/L   ALT 20  0 - 35 U/L   Alkaline Phosphatase 71  39 - 117 U/L   Total Bilirubin 0.2 (*) 0.3 - 1.2 mg/dL   GFR calc non Af Amer >90  >90 mL/min   GFR calc Af Amer >90  >90 mL/min    Ct Abdomen Pelvis Wo Contrast  08/26/2012  *RADIOLOGY REPORT*  Clinical Data: Right flank pain.  Right back pain that radiates to the left side.  Onset yesterday with vomiting.  Post tubal ligation, cholecystectomy, hysterectomy and appendectomy.  CT ABDOMEN AND PELVIS WITHOUT CONTRAST  Technique:  Multidetector CT imaging of the abdomen and pelvis was performed following the standard protocol without intravenous contrast.  Comparison: 09/17/2011.  Findings: No renal or ureteral calculi or findings to suggest renal collecting system obstruction.  No extraluminal bowel inflammatory process, free fluid or free air. Stool like appearance of the distal ileum which has been described in patients with malabsorption.  Evaluation of solid abdominal viscera is limited by lack of IV contrast.  Taking this limitation into account no worrisome hepatic, splenic, pancreatic, renal or adrenal lesion.  Post cholecystectomy, appendectomy and hysterectomy.  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  No  abdominal aortic aneurysm.  No adenopathy.  IMPRESSION: No renal or ureteral calculi or findings to suggest renal collecting system obstruction.  No extraluminal bowel inflammatory process, free fluid or free air. Stool like appearance of the distal ileum which has been described in patients with malabsorption.  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   Original Report Authenticated By: Lacy Duverney, M.D.     Assessment: 32 y.o. female with abdominal pain   UTI  Plan:  Antibiotics, pain management, Phenergan Rx   Follow up with urology   Follow up with PCP for possible malabsorption problem   Discussed with the patient and all questioned fully answered. I discussed results of CT scan and need for follow up for the hematuria and for the possible malabsorption noted on CT. Patient voices understanding.    Medication List    TAKE these medications       ciprofloxacin 500 MG tablet  Commonly known as:  CIPRO  Take 1 tablet (500 mg total) by mouth every 12 (twelve) hours.     HYDROcodone-acetaminophen 5-325 MG per tablet  Commonly known as:  NORCO/VICODIN  Take 1 tablet by mouth every 4 (four) hours as needed for pain.     phenazopyridine 200 MG tablet  Commonly known as:  PYRIDIUM  Take 1 tablet (200 mg total) by mouth 3 (three) times daily.     promethazine 25 MG tablet  Commonly known as:  PHENERGAN  Take 1  tablet (25 mg total) by mouth every 6 (six) hours as needed for nausea.      ASK your doctor about these medications       acetaminophen 500 MG tablet  Commonly known as:  TYLENOL  Take 1,000 mg by mouth every 6 (six) hours as needed. For pain     HYDROcodone-acetaminophen 5-500 MG per tablet  Commonly known as:  VICODIN  Take 1-2 tablets by mouth every 6 (six) hours as needed for pain.     metoCLOPramide 10 MG tablet  Commonly known as:  REGLAN  Take 1 tablet (10 mg total) by mouth every 6 (six) hours as needed (nausea/headache).      oxyCODONE-acetaminophen 5-325 MG per tablet  Commonly known as:  PERCOCET  Take 1-2 tablets by mouth every 6 (six) hours as needed for pain.          Janne Napoleon, [place] 08/26/12 1259

## 2012-08-26 NOTE — ED Notes (Signed)
Pt states that she would like another dose of pain medication, Hope, FNP informed of request.  No New orders received at this time.

## 2012-08-27 NOTE — ED Provider Notes (Signed)
Medical screening examination/treatment/procedure(s) were performed by non-physician practitioner and as supervising physician I was immediately available for consultation/collaboration.    Vida Roller, MD 08/27/12 [phone]

## 2012-08-28 LAB — URINE CULTURE

## 2012-09-02 ENCOUNTER — Emergency Department (HOSPITAL_BASED_OUTPATIENT_CLINIC_OR_DEPARTMENT_OTHER)
Admission: EM | Admit: 2012-09-02 | Discharge: 2012-09-02 | Disposition: A | Discharge status: Home / Self Care | Payer: Medicaid Other | Attending: Emergency Medicine | Admitting: Emergency Medicine

## 2012-09-02 ENCOUNTER — Encounter (HOSPITAL_BASED_OUTPATIENT_CLINIC_OR_DEPARTMENT_OTHER): Payer: Self-pay | Admitting: *Deleted

## 2012-09-02 DIAGNOSIS — R1031 Right lower quadrant pain: Secondary | ICD-10-CM | POA: Insufficient documentation

## 2012-09-02 DIAGNOSIS — R300 Dysuria: Secondary | ICD-10-CM | POA: Insufficient documentation

## 2012-09-02 DIAGNOSIS — R112 Nausea with vomiting, unspecified: Secondary | ICD-10-CM | POA: Insufficient documentation

## 2012-09-02 DIAGNOSIS — R350 Frequency of micturition: Secondary | ICD-10-CM | POA: Insufficient documentation

## 2012-09-02 DIAGNOSIS — Z8619 Personal history of other infectious and parasitic diseases: Secondary | ICD-10-CM | POA: Insufficient documentation

## 2012-09-02 DIAGNOSIS — N912 Amenorrhea, unspecified: Secondary | ICD-10-CM | POA: Insufficient documentation

## 2012-09-02 DIAGNOSIS — J449 Chronic obstructive pulmonary disease, unspecified: Secondary | ICD-10-CM | POA: Insufficient documentation

## 2012-09-02 DIAGNOSIS — R319 Hematuria, unspecified: Secondary | ICD-10-CM | POA: Insufficient documentation

## 2012-09-02 DIAGNOSIS — Z8742 Personal history of other diseases of the female genital tract: Secondary | ICD-10-CM | POA: Insufficient documentation

## 2012-09-02 DIAGNOSIS — J4489 Other specified chronic obstructive pulmonary disease: Secondary | ICD-10-CM | POA: Insufficient documentation

## 2012-09-02 DIAGNOSIS — Z9089 Acquired absence of other organs: Secondary | ICD-10-CM | POA: Insufficient documentation

## 2012-09-02 DIAGNOSIS — Z9851 Tubal ligation status: Secondary | ICD-10-CM | POA: Insufficient documentation

## 2012-09-02 DIAGNOSIS — F17200 Nicotine dependence, unspecified, uncomplicated: Secondary | ICD-10-CM | POA: Insufficient documentation

## 2012-09-02 DIAGNOSIS — Z90710 Acquired absence of both cervix and uterus: Secondary | ICD-10-CM | POA: Insufficient documentation

## 2012-09-02 LAB — URINALYSIS, ROUTINE W REFLEX MICROSCOPIC
Bilirubin Urine: NEGATIVE
Ketones, ur: NEGATIVE mg/dL
Nitrite: NEGATIVE
Specific Gravity, Urine: 1.02 (ref 1.005–1.030)
pH: 5 (ref 5.0–8.0)

## 2012-09-02 LAB — URINE MICROSCOPIC-ADD ON

## 2012-09-02 MED ORDER — OXYBUTYNIN CHLORIDE ER 10 MG PO TB24: 10.0000 mg | ORAL_TABLET | Freq: Every day | ORAL | Status: DC

## 2012-09-02 MED ORDER — CEPHALEXIN 250 MG PO CAPS: 250.0000 mg | ORAL_CAPSULE | Freq: Four times a day (QID) | ORAL | Status: DC

## 2012-09-02 NOTE — ED Provider Notes (Addendum)
History  This chart was scribed for Sabrina Quarry, MD by Shari Heritage, ED Scribe. The patient was seen in room MH12/MH12. Patient's care was started at 1609.   CSN: [id number]  Arrival date & time 09/02/12  1417   First MD Initiated Contact with Patient 09/02/12 1609      Chief Complaint  Patient presents with  . Hematuria    Patient is a 32 y.o. female presenting with hematuria and cramps. The history is provided by the patient. No language interpreter was used.  Hematuria This is a recurrent problem. The current episode started more than 1 week ago. The problem occurs constantly. The problem has not changed since onset.Associated symptoms include abdominal pain (cramping). Nothing aggravates the symptoms. Nothing relieves the symptoms. Treatments tried: Cipro.  Abdominal Cramping This is a recurrent problem. The current episode started more than 1 week ago. The problem occurs constantly. The problem has not changed since onset.Associated symptoms include abdominal pain (cramping). Nothing aggravates the symptoms. Nothing relieves the symptoms. Treatments tried: Tylenol and Motrin\ The treatment provided no relief.     HPI Comments: Sabrina Andrews is a 32 y.o. female with history of spinal hemangioma who presents to the Emergency Department complaining of persistent hematuria; and moderate constant lower abdominal cramping onset 1 ago. There is associated vomiting, dysuria, urinary frequency and reduced urine voids. Patient states that she is intolerant of food and liquids. Her last episode of vomiting was 3 hours ago. Patient states that current symptoms have been persistent since they began about 1 week ago.  Patient states she has taken Motrin and Tylenol for pain without relief. There is no fever, chills or diarrhea. Per medical records, patient was seen on 08/26/2012 RLQ complaining of abdominal pain, nausea, urinary frequency and urgency. Patient was diagnosed with a UTI and prescribed  Cipro to treat with instructions to follow up with urology. Patient states that she has not followed up with a urologist, but has taken medicines as instructed without relief from symptoms. Patient has a surgical history of abdominal hysterectomy, cholecystectomy and appendectomy. She is amenorrheic.  Past Medical History  Diagnosis Date  . HPV (human papilloma virus) infection   . S/P tubal ligation   . Asthma   . H/O LEEP (loop electrosurgical excision procedure) of cervix complicating pregnancy   . Vulvar dysplasia     Past Surgical History  Procedure Laterality Date  . Cholecystectomy    . Cesarean section    . Abdominal hysterectomy    . Appendectomy      History reviewed. No pertinent family history.  History  Substance Use Topics  . Smoking status: Current Every Day Smoker -- 0.50 packs/day    Types: Cigarettes  . Smokeless tobacco: Never Used  . Alcohol Use: No    OB History   Grav Para Term Preterm Abortions TAB SAB Ect Mult Living   3 1 1              Review of Systems  Constitutional: Negative for fever and chills.  Gastrointestinal: Positive for nausea, vomiting and abdominal pain (cramping). Negative for diarrhea.  Genitourinary: Positive for dysuria, frequency and hematuria.  All other systems reviewed and are negative.    Allergies  Ibuprofen and Nsaids  Home Medications   Current Outpatient Rx  Name  Route  Sig  Dispense  Refill  . acetaminophen (TYLENOL) 500 MG tablet   Oral   Take 1,000 mg by mouth every 6 (six) hours as needed. For  pain         . ciprofloxacin (CIPRO) 500 MG tablet   Oral   Take 1 tablet (500 mg total) by mouth every 12 (twelve) hours.   10 tablet   0   . HYDROcodone-acetaminophen (NORCO/VICODIN) 5-325 MG per tablet   Oral   Take 1 tablet by mouth every 4 (four) hours as needed for pain.   15 tablet   0   . HYDROcodone-acetaminophen (VICODIN) 5-500 MG per tablet   Oral   Take 1-2 tablets by mouth every 6 (six)  hours as needed for pain.   10 tablet   0   . EXPIRED: metoCLOPramide (REGLAN) 10 MG tablet   Oral   Take 1 tablet (10 mg total) by mouth every 6 (six) hours as needed (nausea/headache).   6 tablet   0   . oxyCODONE-acetaminophen (PERCOCET) 5-325 MG per tablet   Oral   Take 1-2 tablets by mouth every 6 (six) hours as needed for pain.   15 tablet   0   . phenazopyridine (PYRIDIUM) 200 MG tablet   Oral   Take 1 tablet (200 mg total) by mouth 3 (three) times daily.   6 tablet   0   . promethazine (PHENERGAN) 25 MG tablet   Oral   Take 1 tablet (25 mg total) by mouth every 6 (six) hours as needed for nausea.   30 tablet   0     Triage Vitals: BP 127/88  Pulse 100  Temp(Src) 98.6 F (37 C) (Oral)  Resp 16  Ht 5\' 2"  (1.575 m)  Wt 150 lb (68.04 kg)  BMI 27.43 kg/m2  SpO2 100%  LMP 07/29/2011  Physical Exam  Nursing note and vitals reviewed. Constitutional: She is oriented to person, place, and time. She appears well-developed and well-nourished.  HENT:  Head: Normocephalic and atraumatic.  Right Ear: External ear normal.  Left Ear: External ear normal.  Nose: Nose normal.  Mouth/Throat: Oropharynx is clear and moist.  Eyes: Conjunctivae are normal. Pupils are equal, round, and reactive to light.  Neck: Normal range of motion. Neck supple.  Cardiovascular: Normal rate, regular rhythm, normal heart sounds and intact distal pulses.   Pulmonary/Chest: Effort normal and breath sounds normal.  Abdominal: Soft. Bowel sounds are normal. She exhibits no distension.  Musculoskeletal: Normal range of motion.  Neurological: She is alert and oriented to person, place, and time.  Skin: Skin is warm and dry.  Psychiatric: She has a normal mood and affect. Her behavior is normal.    ED Course  Procedures (including critical care time) DIAGNOSTIC STUDIES: Oxygen Saturation is 100% on room air, normal by my interpretation.    COORDINATION OF CARE: 4:28 PM- Patient informed  of current plan for treatment and evaluation and agrees with plan at this time.      Labs Reviewed  URINALYSIS, ROUTINE W REFLEX MICROSCOPIC - Abnormal; Notable for the following:    Color, Urine RED (*)    APPearance CLOUDY (*)    Hgb urine dipstick LARGE (*)    Leukocytes, UA TRACE (*)    All other components within normal limits  URINE MICROSCOPIC-ADD ON - Abnormal; Notable for the following:    Squamous Epithelial / LPF MANY (*)    Bacteria, UA MANY (*)    All other components within normal limits  URINE CULTURE   No results found.   No diagnosis found.    MDM   Patient advised bladder anesthetic and second round  abx.  Advised close f/u urology.   Sabrina Quarry, MD 09/02/12 0981  Sabrina Quarry, MD 09/26/12 [phone]

## 2012-09-02 NOTE — ED Notes (Signed)
Pt states she was seen here 1-1/2 weeks ago for same. Took meds as prescribed. S/S have returned

## 2012-09-02 NOTE — ED Notes (Signed)
Patient wanted to know why she wasn't given medication specific for pain, "like she got the last time". Explained that the medication she was given would help the symptoms that are causing her pain. She requested the directors name and number stating that the dr was "rude" and "rolled her eyes" at the patient. As patient was leaving she was stating that she should have been given pain medication.

## 2012-09-04 LAB — URINE CULTURE

## 2012-09-28 ENCOUNTER — Emergency Department (HOSPITAL_COMMUNITY): Payer: Medicaid Other

## 2012-09-28 ENCOUNTER — Emergency Department (HOSPITAL_COMMUNITY)
Admission: EM | Admit: 2012-09-28 | Discharge: 2012-09-28 | Disposition: A | Discharge status: Home / Self Care | Payer: Medicaid Other | Attending: Emergency Medicine | Admitting: Emergency Medicine

## 2012-09-28 ENCOUNTER — Encounter (HOSPITAL_COMMUNITY): Payer: Self-pay | Admitting: *Deleted

## 2012-09-28 DIAGNOSIS — Y929 Unspecified place or not applicable: Secondary | ICD-10-CM | POA: Insufficient documentation

## 2012-09-28 DIAGNOSIS — Y9372 Activity, wrestling: Secondary | ICD-10-CM | POA: Insufficient documentation

## 2012-09-28 DIAGNOSIS — S0083XA Contusion of other part of head, initial encounter: Secondary | ICD-10-CM

## 2012-09-28 DIAGNOSIS — Z8619 Personal history of other infectious and parasitic diseases: Secondary | ICD-10-CM | POA: Insufficient documentation

## 2012-09-28 DIAGNOSIS — Z8742 Personal history of other diseases of the female genital tract: Secondary | ICD-10-CM | POA: Insufficient documentation

## 2012-09-28 DIAGNOSIS — F17200 Nicotine dependence, unspecified, uncomplicated: Secondary | ICD-10-CM | POA: Insufficient documentation

## 2012-09-28 DIAGNOSIS — J45909 Unspecified asthma, uncomplicated: Secondary | ICD-10-CM | POA: Insufficient documentation

## 2012-09-28 DIAGNOSIS — S0003XA Contusion of scalp, initial encounter: Secondary | ICD-10-CM | POA: Insufficient documentation

## 2012-09-28 DIAGNOSIS — IMO0002 Reserved for concepts with insufficient information to code with codable children: Secondary | ICD-10-CM | POA: Insufficient documentation

## 2012-09-28 MED ORDER — HYDROCODONE-ACETAMINOPHEN 5-325 MG PO TABS: 1.0000 | ORAL_TABLET | ORAL | Status: DC | PRN

## 2012-09-28 MED ORDER — OXYCODONE-ACETAMINOPHEN 5-325 MG PO TABS
1.0000 | ORAL_TABLET | Freq: Once | ORAL | Status: AC
  Administered 2012-09-28: 1 via ORAL
  Filled 2012-09-28: qty 1
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
## 2012-09-28 NOTE — ED Notes (Signed)
Pt ambulatory leaving ED; pt alert and mentating appropriately upon d/c. Pt given d/c teaching, prescriptions and educated on at home management; pt verbalizes understanding of d/c teaching and has no fruther questions upon d.c, Pt instructed not to drive and endorses husband is driving home

## 2012-09-28 NOTE — ED Notes (Signed)
Pt returned from xray
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
## 2012-11-18 ENCOUNTER — Emergency Department: Payer: Self-pay | Admitting: Emergency Medicine

## 2012-12-08 ENCOUNTER — Encounter (HOSPITAL_BASED_OUTPATIENT_CLINIC_OR_DEPARTMENT_OTHER): Payer: Self-pay | Admitting: *Deleted

## 2012-12-08 ENCOUNTER — Emergency Department (HOSPITAL_BASED_OUTPATIENT_CLINIC_OR_DEPARTMENT_OTHER)
Admission: EM | Admit: 2012-12-08 | Discharge: 2012-12-08 | Disposition: A | Discharge status: Home / Self Care | Payer: Medicaid Other | Attending: Emergency Medicine | Admitting: Emergency Medicine

## 2012-12-08 DIAGNOSIS — Z8619 Personal history of other infectious and parasitic diseases: Secondary | ICD-10-CM | POA: Insufficient documentation

## 2012-12-08 DIAGNOSIS — Y9389 Activity, other specified: Secondary | ICD-10-CM | POA: Insufficient documentation

## 2012-12-08 DIAGNOSIS — Y9289 Other specified places as the place of occurrence of the external cause: Secondary | ICD-10-CM | POA: Insufficient documentation

## 2012-12-08 DIAGNOSIS — Z9851 Tubal ligation status: Secondary | ICD-10-CM | POA: Insufficient documentation

## 2012-12-08 DIAGNOSIS — Z8742 Personal history of other diseases of the female genital tract: Secondary | ICD-10-CM | POA: Insufficient documentation

## 2012-12-08 DIAGNOSIS — F17200 Nicotine dependence, unspecified, uncomplicated: Secondary | ICD-10-CM | POA: Insufficient documentation

## 2012-12-08 DIAGNOSIS — M549 Dorsalgia, unspecified: Secondary | ICD-10-CM

## 2012-12-08 DIAGNOSIS — IMO0002 Reserved for concepts with insufficient information to code with codable children: Secondary | ICD-10-CM | POA: Insufficient documentation

## 2012-12-08 DIAGNOSIS — J45909 Unspecified asthma, uncomplicated: Secondary | ICD-10-CM | POA: Insufficient documentation

## 2012-12-08 MED ORDER — CYCLOBENZAPRINE HCL 10 MG PO TABS: 10.0000 mg | ORAL_TABLET | Freq: Two times a day (BID) | ORAL | Status: DC | PRN

## 2012-12-08 MED ORDER — CYCLOBENZAPRINE HCL 10 MG PO TABS
10.0000 mg | ORAL_TABLET | Freq: Once | ORAL | Status: AC
  Administered 2012-12-08: 10 mg via ORAL
  Filled 2012-12-08: qty 1

## 2012-12-08 MED ORDER — ACETAMINOPHEN 500 MG PO TABS
1000.0000 mg | ORAL_TABLET | Freq: Once | ORAL | Status: AC
  Administered 2012-12-08: 1000 mg via ORAL
  Filled 2012-12-08: qty 2

## 2012-12-08 MED ORDER — TRAMADOL HCL 50 MG PO TABS: 50.0000 mg | ORAL_TABLET | Freq: Four times a day (QID) | ORAL | Status: DC | PRN

## 2012-12-08 NOTE — ED Provider Notes (Signed)
History     CSN: [id number]  Arrival date & time 12/08/12  1431   First MD Initiated Contact with Patient 12/08/12 1449      Chief Complaint  Patient presents with  . Back Injury    (Consider location/radiation/quality/duration/timing/severity/associated sxs/prior treatment) HPI Comments: Patient is a 32 year old female who presents with sudden onset of right lower back pain that started this morning when her son jumped on her back. The pain is sharp and severe and does not radiate. The pain is constant. Movement makes the pain worse. Nothing makes the pain better. Patient has not tried anything for pain. No associated symptoms. No saddles paresthesias or bladder/bowel incontinence.      Past Medical History  Diagnosis Date  . HPV (human papilloma virus) infection   . S/P tubal ligation   . Asthma   . H/O LEEP (loop electrosurgical excision procedure) of cervix complicating pregnancy   . Vulvar dysplasia     Past Surgical History  Procedure Laterality Date  . Cholecystectomy    . Cesarean section    . Abdominal hysterectomy    . Appendectomy      History reviewed. No pertinent family history.  History  Substance Use Topics  . Smoking status: Current Every Day Smoker -- 0.50 packs/day    Types: Cigarettes  . Smokeless tobacco: Never Used  . Alcohol Use: No    OB History   Grav Para Term Preterm Abortions TAB SAB Ect Mult Living   3 1 1              Review of Systems  Musculoskeletal: Positive for back pain.  All other systems reviewed and are negative.    Allergies  Ibuprofen and Nsaids  Home Medications   Current Outpatient Rx  Name  Route  Sig  Dispense  Refill  . acetaminophen (TYLENOL) 500 MG tablet   Oral   Take 1,000 mg by mouth every 6 (six) hours as needed for pain. For pain         . HYDROcodone-acetaminophen (NORCO) 5-325 MG per tablet   Oral   Take 1 tablet by mouth every 4 (four) hours as needed for pain.   10 tablet   0     BP  131/81  Pulse 78  Temp(Src) 98.1 F (36.7 C) (Oral)  Resp 20  Ht 5\' 2"  (1.575 m)  Wt 153 lb (69.4 kg)  BMI 27.98 kg/m2  SpO2 100%  LMP 07/29/2011  Physical Exam  Nursing note and vitals reviewed. Constitutional: She appears well-developed and well-nourished. No distress.  HENT:  Head: Normocephalic and atraumatic.  Eyes: Conjunctivae are normal.  Neck: Normal range of motion.  Cardiovascular: Normal rate and regular rhythm.  Exam reveals no gallop and no friction rub.   No murmur heard. Pulmonary/Chest: Effort normal and breath sounds normal. She has no wheezes. She has no rales. She exhibits no tenderness.  Abdominal: Soft. There is no tenderness.  Musculoskeletal: Normal range of motion.  Right lumbar paraspinal tenderness to palpation. No midline spine tenderness.   Neurological: She is alert. Coordination normal.  Lower extremity strength and sensation equal and intact bilaterally. Speech is goal-oriented. Moves limbs without ataxia.   Skin: Skin is warm and dry.  Psychiatric: She has a normal mood and affect. Her behavior is normal.    ED Course  Procedures (including critical care time)  Labs Reviewed  PREGNANCY, URINE  URINALYSIS, ROUTINE W REFLEX MICROSCOPIC   No results found.   1.  Acute back pain       MDM  3:36 PM Patient likely experiencing musculoskeletal pain from a back strain. No bladder/bowel incontinence or saddle paresthesias. Patient instructed to return with worsening or concerning symptoms.         Emilia Beck, PA-C 12/08/12 1541

## 2012-12-08 NOTE — Discharge Instructions (Signed)
Take Tramadol and Flexeril as needed for pain. You may take these medications together. Refer to attached documents for more information regarding your diagnosis. Return to the ED with worsening or concerning symptoms.

## 2012-12-08 NOTE — ED Provider Notes (Signed)
Medical screening examination/treatment/procedure(s) were performed by non-physician practitioner and as supervising physician I was immediately available for consultation/collaboration.   Nollie Terlizzi B. Bernette Mayers, MD 12/08/12 [phone]

## 2012-12-08 NOTE — ED Notes (Signed)
Pt states she was "wrestling around" with her son and he jumped on her back. She felt something "pop". Now pain radiates down her leg.

## 2013-01-04 ENCOUNTER — Encounter (HOSPITAL_BASED_OUTPATIENT_CLINIC_OR_DEPARTMENT_OTHER): Payer: Self-pay | Admitting: Emergency Medicine

## 2013-01-04 ENCOUNTER — Emergency Department (HOSPITAL_BASED_OUTPATIENT_CLINIC_OR_DEPARTMENT_OTHER)
Admission: EM | Admit: 2013-01-04 | Discharge: 2013-01-04 | Disposition: A | Discharge status: Home Health | Payer: Medicaid Other | Attending: Emergency Medicine | Admitting: Emergency Medicine

## 2013-01-04 DIAGNOSIS — F17200 Nicotine dependence, unspecified, uncomplicated: Secondary | ICD-10-CM | POA: Insufficient documentation

## 2013-01-04 DIAGNOSIS — M545 Low back pain: Secondary | ICD-10-CM

## 2013-01-04 DIAGNOSIS — Y939 Activity, unspecified: Secondary | ICD-10-CM | POA: Insufficient documentation

## 2013-01-04 DIAGNOSIS — Z8619 Personal history of other infectious and parasitic diseases: Secondary | ICD-10-CM | POA: Insufficient documentation

## 2013-01-04 DIAGNOSIS — Y929 Unspecified place or not applicable: Secondary | ICD-10-CM | POA: Insufficient documentation

## 2013-01-04 DIAGNOSIS — J45909 Unspecified asthma, uncomplicated: Secondary | ICD-10-CM | POA: Insufficient documentation

## 2013-01-04 DIAGNOSIS — Z8742 Personal history of other diseases of the female genital tract: Secondary | ICD-10-CM | POA: Insufficient documentation

## 2013-01-04 DIAGNOSIS — IMO0002 Reserved for concepts with insufficient information to code with codable children: Secondary | ICD-10-CM | POA: Insufficient documentation

## 2013-01-04 DIAGNOSIS — X500XXA Overexertion from strenuous movement or load, initial encounter: Secondary | ICD-10-CM | POA: Insufficient documentation

## 2013-01-04 MED ORDER — CYCLOBENZAPRINE HCL 10 MG PO TABS: 10.0000 mg | ORAL_TABLET | Freq: Two times a day (BID) | ORAL | Status: DC | PRN

## 2013-01-04 MED ORDER — TRAMADOL HCL 50 MG PO TABS: 50.0000 mg | ORAL_TABLET | Freq: Four times a day (QID) | ORAL | Status: DC | PRN

## 2013-01-04 NOTE — ED Notes (Addendum)
Pt sts while playing with child, child jumped on her back and heard a pop, has been hurting since; worse today. Pain is located in rigth buttucks and radiates down buttock to top of right thigh. Pt sts walking increases pain; some positions while lying down are much worse than others. Pain is now 8/10. Pt sts she has taken tylenol at home without relief. sts allergic to ibuprofen.

## 2013-01-04 NOTE — ED Provider Notes (Signed)
History    CSN: [id number] Arrival date & time 01/04/13  1217  First MD Initiated Contact with Patient 01/04/13 1242     Chief Complaint  Patient presents with  . Back Pain   (Consider location/radiation/quality/duration/timing/severity/associated sxs/prior Treatment) Patient is a 32 y.o. female presenting with back pain.  Back Pain  Pt with history of low back pain reports her son jumped on her back yesterday and she heard a pop. Since then has had moderate to severe aching R lower back pain, radiating into her R buttock. Similar to episode from last month treated here with Ultram and Flexeril with good improvement. Does not have PCP. She denies any numbness, weakness, or bower/bladder problems.   Past Medical History  Diagnosis Date  . HPV (human papilloma virus) infection   . S/P tubal ligation   . Asthma   . H/O LEEP (loop electrosurgical excision procedure) of cervix complicating pregnancy   . Vulvar dysplasia    Past Surgical History  Procedure Laterality Date  . Cholecystectomy    . Cesarean section    . Abdominal hysterectomy    . Appendectomy     No family history on file. History  Substance Use Topics  . Smoking status: Current Every Day Smoker -- 0.50 packs/day    Types: Cigarettes  . Smokeless tobacco: Never Used  . Alcohol Use: No   OB History   Grav Para Term Preterm Abortions TAB SAB Ect Mult Living   3 1 1             Review of Systems  Musculoskeletal: Positive for back pain.   All other systems reviewed and are negative except as noted in HPI.    Allergies  Ibuprofen and Nsaids  Home Medications   Current Outpatient Rx  Name  Route  Sig  Dispense  Refill  . acetaminophen (TYLENOL) 500 MG tablet   Oral   Take 1,000 mg by mouth every 6 (six) hours as needed for pain. For pain         . cyclobenzaprine (FLEXERIL) 10 MG tablet   Oral   Take 1 tablet (10 mg total) by mouth 2 (two) times daily as needed for muscle spasms.   20 tablet  0   . HYDROcodone-acetaminophen (NORCO) 5-325 MG per tablet   Oral   Take 1 tablet by mouth every 4 (four) hours as needed for pain.   10 tablet   0   . traMADol (ULTRAM) 50 MG tablet   Oral   Take 1 tablet (50 mg total) by mouth every 6 (six) hours as needed for pain.   15 tablet   0    BP 133/76  Pulse 99  Temp(Src) 98.5 F (36.9 C) (Oral)  Resp 20  Ht 5\' 2"  (1.575 m)  Wt 154 lb (69.854 kg)  BMI 28.16 kg/m2  SpO2 100%  LMP 07/29/2011 Physical Exam  Nursing note and vitals reviewed. Constitutional: She is oriented to person, place, and time. She appears well-developed and well-nourished.  HENT:  Head: Normocephalic and atraumatic.  Eyes: EOM are normal. Pupils are equal, round, and reactive to light.  Neck: Normal range of motion. Neck supple.  Cardiovascular: Normal rate, normal heart sounds and intact distal pulses.   Pulmonary/Chest: Effort normal and breath sounds normal.  Abdominal: Bowel sounds are normal. She exhibits no distension. There is no tenderness.  Musculoskeletal: Normal range of motion. She exhibits tenderness (tender right lower back and right buttock). She exhibits no edema.  Neurological: She is alert and oriented to person, place, and time. She has normal strength. No cranial nerve deficit or sensory deficit.  Skin: Skin is warm and dry. No rash noted.  Psychiatric: She has a normal mood and affect.    ED Course  Procedures (including critical care time) Labs Reviewed - No data to display No results found. 1. Low back pain     MDM  Low back pain, no Red Flags. D/C with ultram/flexeril and PCP referral.   Bonnita Levan. Bernette Mayers, MD 01/04/13 1249

## 2013-01-23 ENCOUNTER — Encounter (HOSPITAL_COMMUNITY): Payer: Self-pay | Admitting: Emergency Medicine

## 2013-01-23 DIAGNOSIS — R319 Hematuria, unspecified: Secondary | ICD-10-CM | POA: Insufficient documentation

## 2013-01-23 DIAGNOSIS — R300 Dysuria: Secondary | ICD-10-CM | POA: Insufficient documentation

## 2013-01-23 NOTE — ED Notes (Signed)
PT. REPORTS HEMATURIA / DYSURIA WITH LOW BACK PAIN ONSET LAST NIGHT , DENIES INJURY , NO FEVER OR CHILLS.

## 2013-01-24 ENCOUNTER — Emergency Department (HOSPITAL_COMMUNITY)
Admission: EM | Admit: 2013-01-24 | Discharge: 2013-01-24 | Discharge status: Against Medical Advice | Payer: Medicaid Other | Attending: Emergency Medicine | Admitting: Emergency Medicine

## 2013-01-24 DIAGNOSIS — R300 Dysuria: Secondary | ICD-10-CM

## 2013-01-24 NOTE — ED Notes (Signed)
Unable to locate pt  

## 2013-01-24 NOTE — ED Provider Notes (Signed)
Patient presents to the emergency department with hematuria and dysuria per the triage note.  This patient left without being seen after triage.  I did not personally evaluate this patient.  Lyanne Co, MD 01/24/13 [phone]

## 2013-01-24 NOTE — ED Notes (Signed)
No response from waiting room when called x 1

## 2013-01-28 ENCOUNTER — Emergency Department: Payer: Self-pay | Admitting: Emergency Medicine

## 2013-01-28 LAB — CBC
HGB: 14.4 g/dL (ref 12.0–16.0)
MCHC: 34.8 g/dL (ref 32.0–36.0)
MCV: 95 fL (ref 80–100)
Platelet: 209 10*3/uL (ref 150–440)
RDW: 14.3 % (ref 11.5–14.5)

## 2013-01-29 LAB — HEPATIC FUNCTION PANEL A (ARMC)
Albumin: 3.2 g/dL — ABNORMAL LOW (ref 3.4–5.0)
Bilirubin, Direct: <0.1 mg/dL (ref 0.00–0.20)
SGOT(AST): 26 U/L (ref 15–37)
Total Protein: 7 g/dL (ref 6.4–8.2)

## 2013-01-29 LAB — URINALYSIS, COMPLETE
Bacteria: NONE SEEN
Ketone: NEGATIVE
Leukocyte Esterase: NEGATIVE
Protein: 100
Specific Gravity: 1.016 (ref 1.003–1.030)

## 2013-01-29 LAB — BASIC METABOLIC PANEL
Anion Gap: 7 (ref 7–16)
Calcium, Total: 8.7 mg/dL (ref 8.5–10.1)
Chloride: 107 mmol/L (ref 98–107)
Co2: 24 mmol/L (ref 21–32)
Osmolality: 275 (ref 275–301)

## 2013-01-29 LAB — LIPASE, BLOOD: Lipase: 100 U/L (ref 73–393)

## 2013-02-06 ENCOUNTER — Emergency Department: Payer: Self-pay | Admitting: Emergency Medicine

## 2013-03-06 IMAGING — CR DG SHOULDER 2+V*L*
3 series · 3 of 3 positions shown · non-contrast
Comparison: Chest radiograph 09/10/2006.

CLINICAL DATA: 30-year-old female status post blunt trauma, fall,
pain.

LEFT SHOULDER - 2+ VIEW

[w shoulder ap internal left]
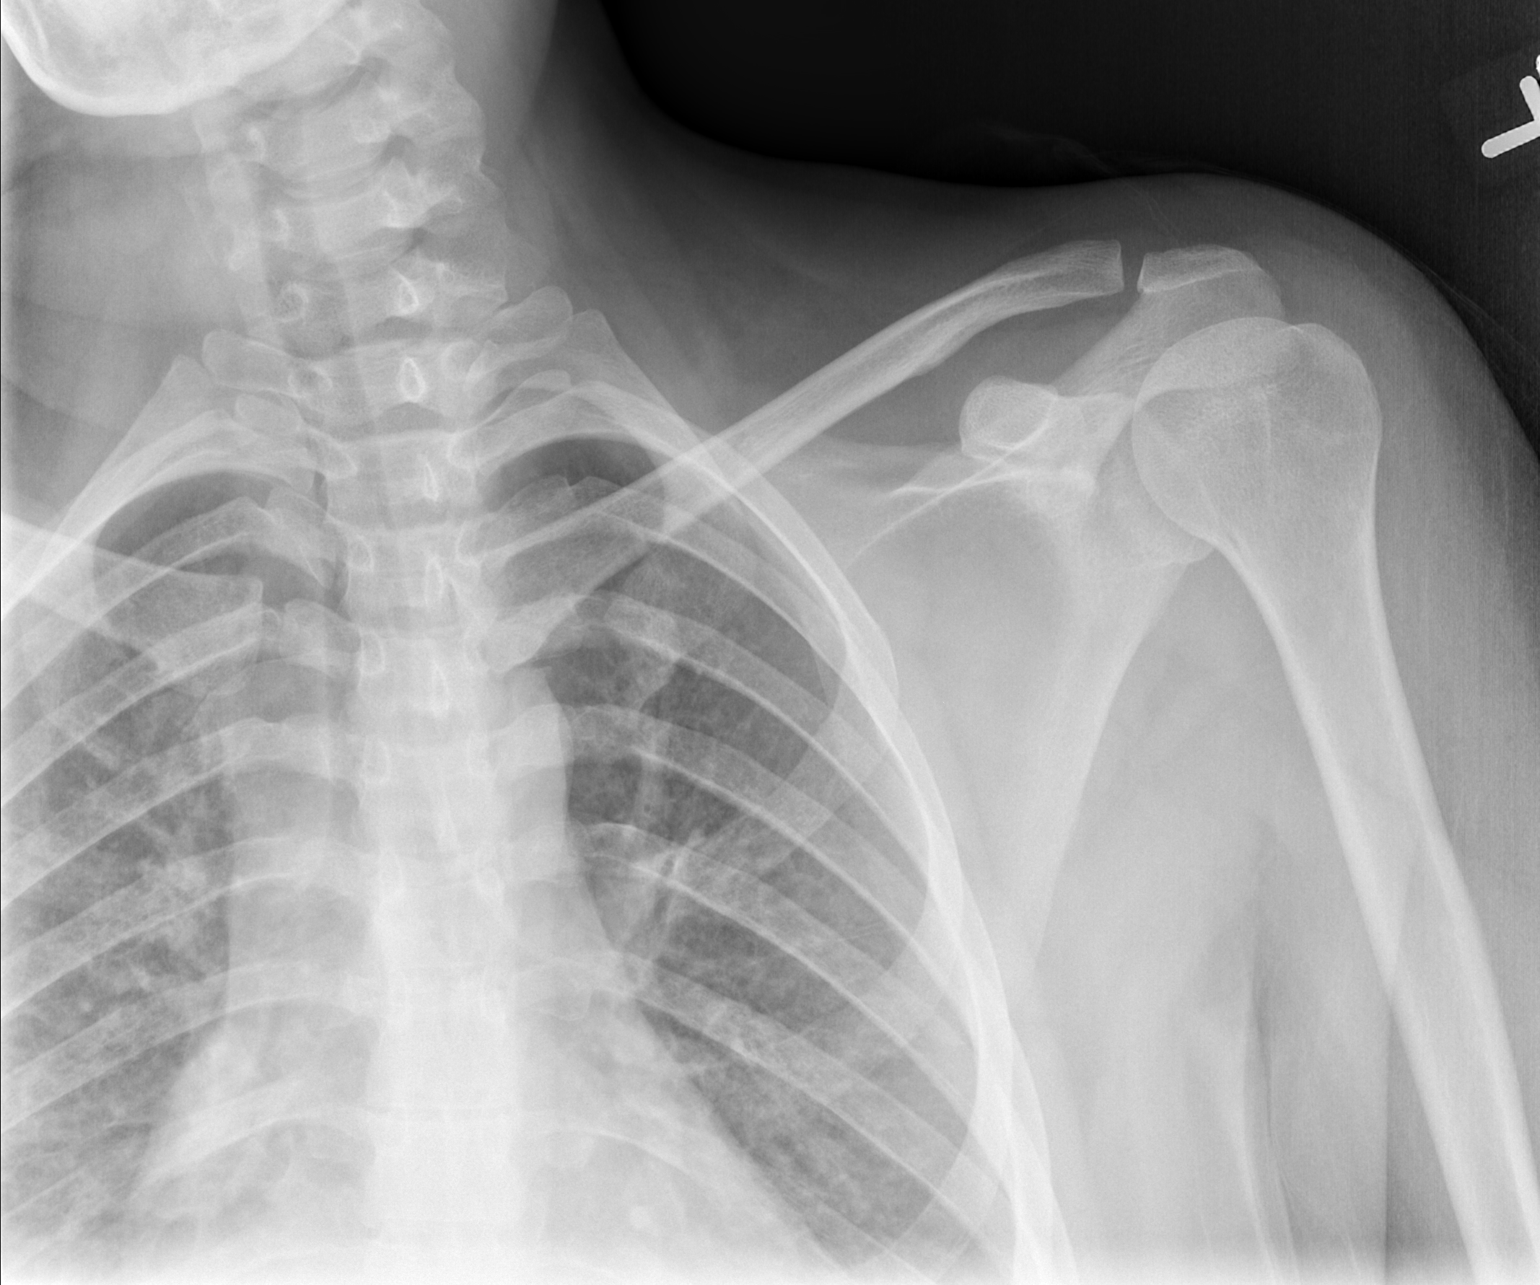

[w shoulder ap external left]
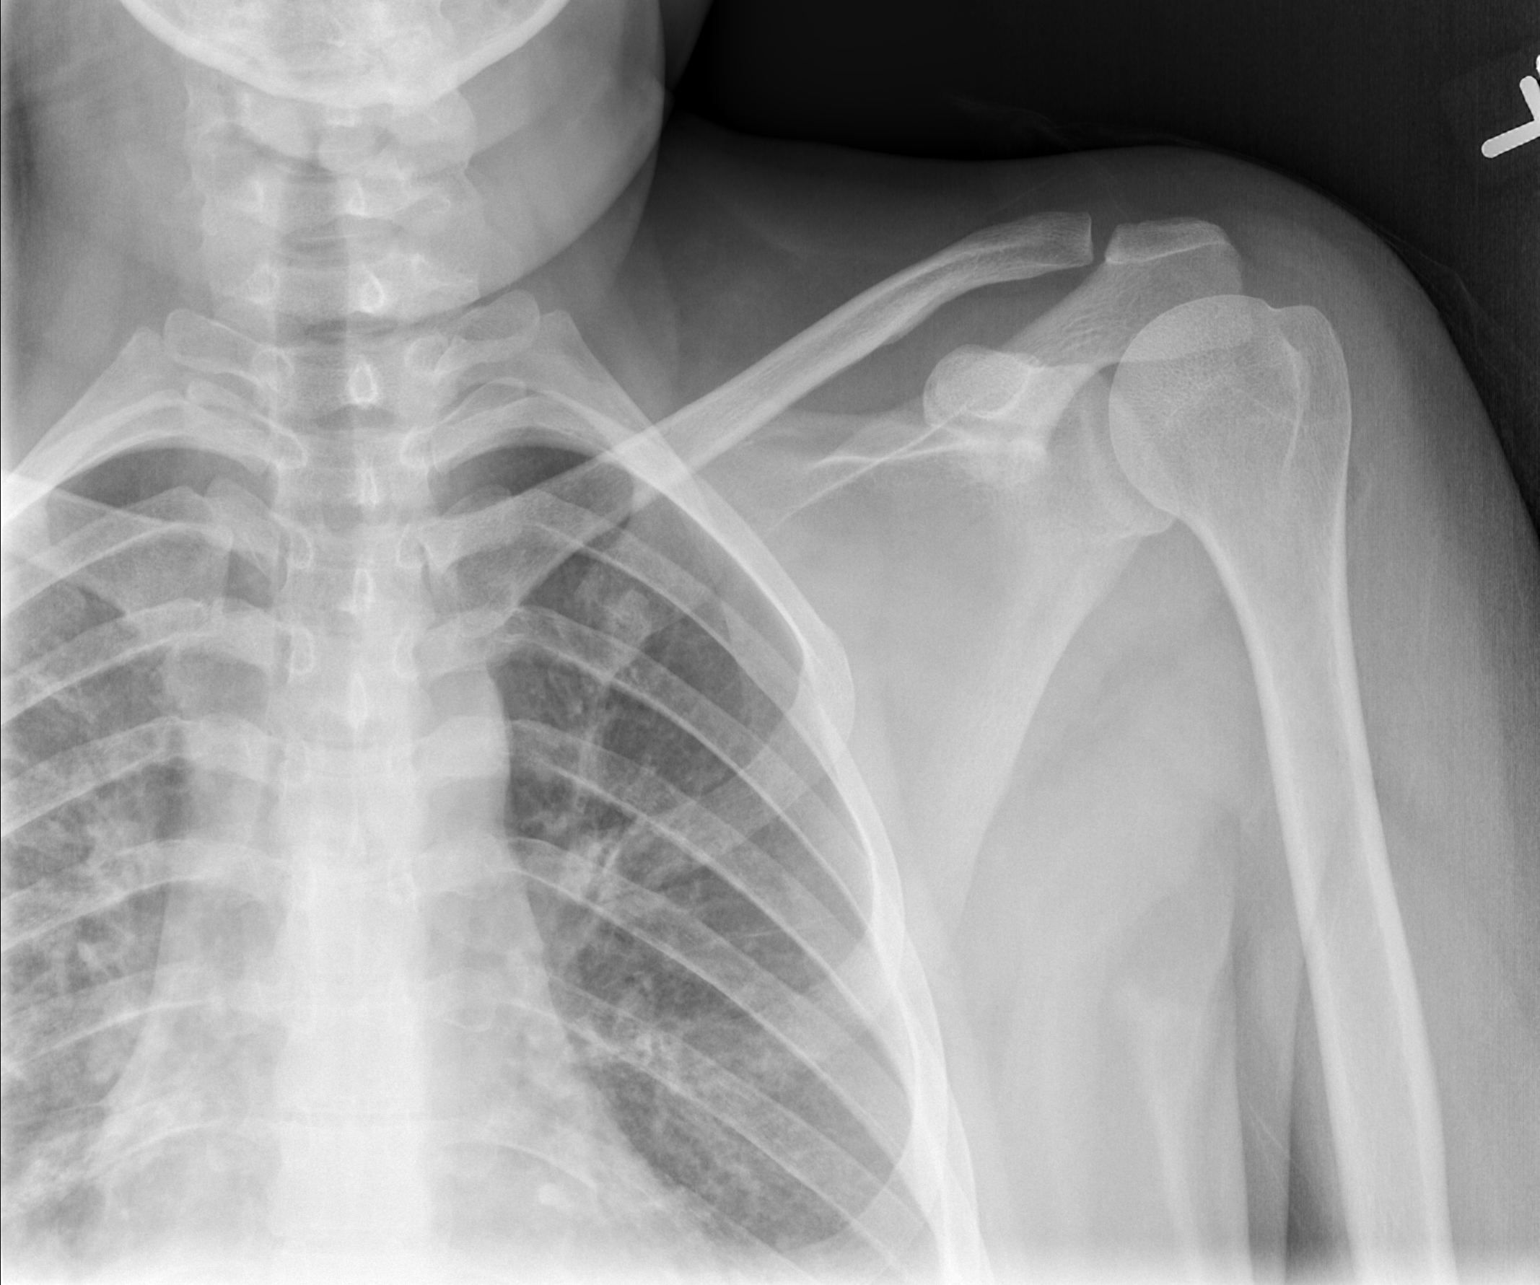

[w shoulder y view left]
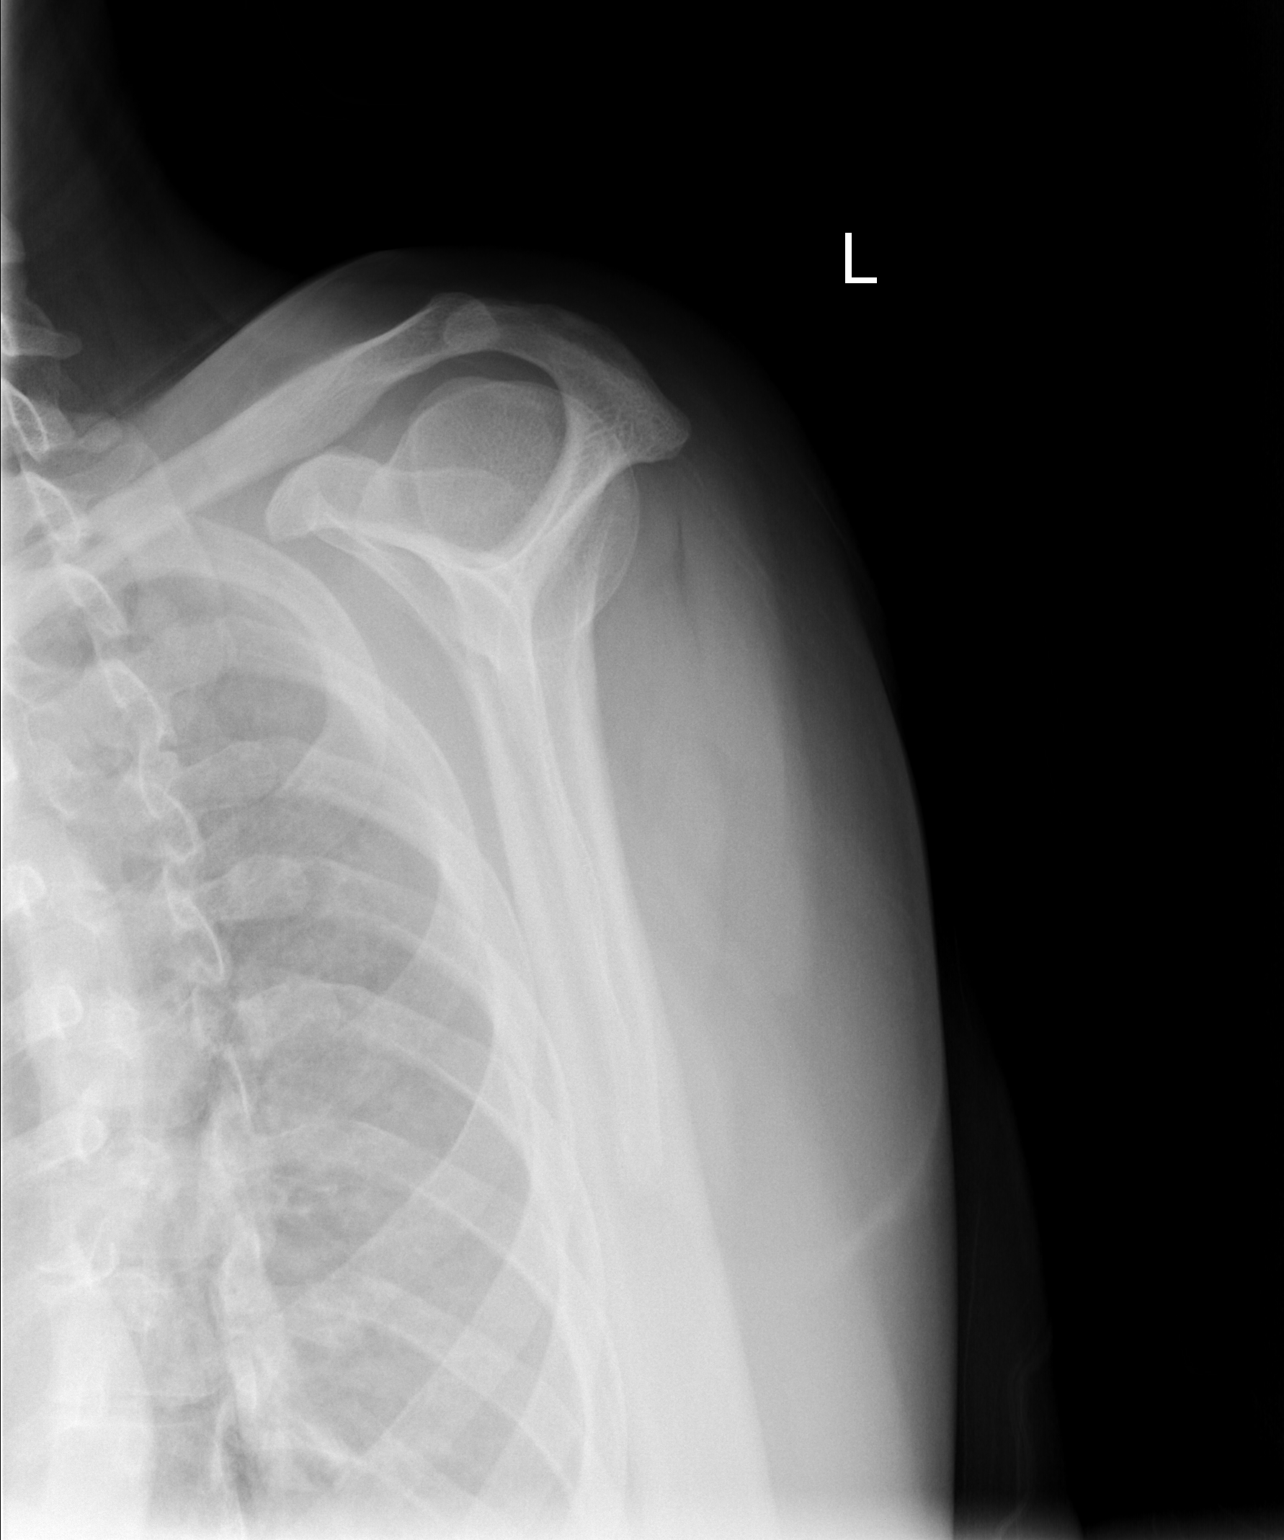

[3 of 3 positions shown; findings below may reference images not displayed]

FINDINGS: Bone mineralization is within normal limits. No
glenohumeral joint dislocation.  Proximal left humerus intact.
Left clavicle and left scapula intact.  Visualized left ribs and
lung parenchyma within normal limits.
IMPRESSION: No acute fracture or dislocation identified about the left
shoulder.

## 2013-03-09 ENCOUNTER — Encounter (HOSPITAL_COMMUNITY): Payer: Self-pay | Admitting: Emergency Medicine

## 2013-03-09 ENCOUNTER — Emergency Department (HOSPITAL_COMMUNITY): Payer: Medicaid Other

## 2013-03-09 ENCOUNTER — Emergency Department (HOSPITAL_COMMUNITY)
Admission: EM | Admit: 2013-03-09 | Discharge: 2013-03-09 | Disposition: A | Discharge status: Home / Self Care | Payer: Medicaid Other | Attending: Emergency Medicine | Admitting: Emergency Medicine

## 2013-03-09 DIAGNOSIS — Z8619 Personal history of other infectious and parasitic diseases: Secondary | ICD-10-CM | POA: Insufficient documentation

## 2013-03-09 DIAGNOSIS — Y9289 Other specified places as the place of occurrence of the external cause: Secondary | ICD-10-CM | POA: Insufficient documentation

## 2013-03-09 DIAGNOSIS — J45909 Unspecified asthma, uncomplicated: Secondary | ICD-10-CM | POA: Insufficient documentation

## 2013-03-09 DIAGNOSIS — X500XXA Overexertion from strenuous movement or load, initial encounter: Secondary | ICD-10-CM | POA: Insufficient documentation

## 2013-03-09 DIAGNOSIS — S39012A Strain of muscle, fascia and tendon of lower back, initial encounter: Secondary | ICD-10-CM

## 2013-03-09 DIAGNOSIS — Z8742 Personal history of other diseases of the female genital tract: Secondary | ICD-10-CM | POA: Insufficient documentation

## 2013-03-09 DIAGNOSIS — Y9389 Activity, other specified: Secondary | ICD-10-CM | POA: Insufficient documentation

## 2013-03-09 DIAGNOSIS — F17200 Nicotine dependence, unspecified, uncomplicated: Secondary | ICD-10-CM | POA: Insufficient documentation

## 2013-03-09 DIAGNOSIS — S335XXA Sprain of ligaments of lumbar spine, initial encounter: Secondary | ICD-10-CM | POA: Insufficient documentation

## 2013-03-09 MED ORDER — CYCLOBENZAPRINE HCL 10 MG PO TABS: 10.0000 mg | ORAL_TABLET | Freq: Two times a day (BID) | ORAL | Status: DC | PRN

## 2013-03-09 MED ORDER — OXYCODONE-ACETAMINOPHEN 5-325 MG PO TABS
1.0000 | ORAL_TABLET | Freq: Once | ORAL | Status: AC
  Administered 2013-03-09: 1 via ORAL
  Filled 2013-03-09: qty 1

## 2013-03-09 MED ORDER — DIAZEPAM 5 MG PO TABS
5.0000 mg | ORAL_TABLET | Freq: Once | ORAL | Status: AC
  Administered 2013-03-09: 5 mg via ORAL
  Filled 2013-03-09: qty 1

## 2013-03-09 MED ORDER — PREDNISONE 20 MG PO TABS: ORAL_TABLET | ORAL | Status: DC

## 2013-03-09 NOTE — ED Provider Notes (Signed)
CSN: [id number]     Arrival date & time 03/09/13  1246 History   First MD Initiated Contact with Patient 03/09/13 1302     Chief Complaint  Patient presents with  . Back Pain   (Consider location/radiation/quality/duration/timing/severity/associated sxs/prior Treatment) The history is provided by the patient. No language interpreter was used.  Sabrina Andrews is a 32 year old female with past medical history of HPV, asthma, vulvar dysplasia presenting to emergency department with back pain that started last night but has progressively gotten worse this morning. Patient reported that she had an injury to her lower back approximately 2 months ago when her son jumped on her. Patient reported that yesterday she was moving furniture around, she wanted to rearrange her son's room, was moving furniture around that was larger than her size. Patient reported that she was not lifting from her legs, she was hunched over lifting from her back and twisting. Patient reported that the back pain is localized to the lower portion of her back, localized to the right side described as a constant throbbing, burning sensation that is unbearable. Reported that pain is worse with motion, nothing makes the pain better reported that she's been using Tylenol with minimal relief. Denied numbness, tingling, weakness, neck pain, chest pain, shortness of breath, difficulty breathing, urinary and bowel incontinence. PCP none  Past Medical History  Diagnosis Date  . HPV (human papilloma virus) infection   . S/P tubal ligation   . Asthma   . H/O LEEP (loop electrosurgical excision procedure) of cervix complicating pregnancy   . Vulvar dysplasia    Past Surgical History  Procedure Laterality Date  . Cholecystectomy    . Cesarean section    . Abdominal hysterectomy    . Appendectomy     No family history on file. History  Substance Use Topics  . Smoking status: Current Every Day Smoker -- 0.50 packs/day    Types:  Cigarettes  . Smokeless tobacco: Never Used  . Alcohol Use: No   OB History   Grav Para Term Preterm Abortions TAB SAB Ect Mult Living   3 1 1             Review of Systems  HENT: Negative for neck pain and neck stiffness.   Respiratory: Negative for shortness of breath.   Cardiovascular: Negative for chest pain.  Genitourinary: Negative for decreased urine volume and difficulty urinating.  Musculoskeletal: Positive for back pain.  Neurological: Negative for weakness and numbness.    Allergies  Ibuprofen and Nsaids  Home Medications   Current Outpatient Rx  Name  Route  Sig  Dispense  Refill  . acetaminophen (TYLENOL) 500 MG tablet   Oral   Take 1,000 mg by mouth every 6 (six) hours as needed for pain. For pain         . cyclobenzaprine (FLEXERIL) 10 MG tablet   Oral   Take 1 tablet (10 mg total) by mouth 2 (two) times daily as needed for muscle spasms.   20 tablet   0   . predniSONE (DELTASONE) 20 MG tablet      3 tabs po day one, then 2 tabs daily x 4 days   11 tablet   0    BP 105/83  Pulse 94  Temp(Src) 99 F (37.2 C) (Oral)  Resp 16  Ht 5\' 2"  (1.575 m)  Wt 149 lb (67.586 kg)  BMI 27.25 kg/m2  SpO2 97%  LMP 07/29/2011 Physical Exam  Nursing note and vitals reviewed.  Constitutional: She is oriented to person, place, and time. She appears well-developed and well-nourished. No distress.  HENT:  Head: Normocephalic and atraumatic.  Eyes: Conjunctivae and EOM are normal. Pupils are equal, round, and reactive to light. Right eye exhibits no discharge. Left eye exhibits no discharge.  Neck: Normal range of motion. Neck supple.  Cardiovascular: Normal rate and regular rhythm.  Exam reveals no friction rub.   No murmur heard. Pulses:      Radial pulses are 2+ on the right side, and 2+ on the left side.       Dorsalis pedis pulses are 2+ on the right side, and 2+ on the left side.  Pulmonary/Chest: Effort normal and breath sounds normal. No respiratory  distress. She has no wheezes. She has no rales.  Musculoskeletal: Normal range of motion. She exhibits tenderness.       Back:  Discomfort upon palpation to the mid-spine and right paraspinal region of lumbar spine - muscular in nature Full range of motion to lower and upper extremities bilaterally  Discomfort with flexion of the back noted  Neurological: She is alert and oriented to person, place, and time. She exhibits normal muscle tone. Coordination normal.  Strength 5+/5+ to upper and lower extremities bilaterally with resistance, equal distribution identified Sensation intact to upper extremities bilaterally with differentiation to sharp and dull touch Patient able to walk, mild discomfort, limp noted when applying pressure to the right side   Skin: Skin is warm and dry. No rash noted. She is not diaphoretic. No erythema.  Psychiatric: She has a normal mood and affect. Her behavior is normal. Thought content normal.    ED Course  Procedures (including critical care time) Labs Review Labs Reviewed - No data to display Imaging Review Dg Lumbar Spine Complete  03/09/2013   *RADIOLOGY REPORT*  Clinical Data: Low back and right leg pain.  LUMBAR SPINE - COMPLETE 4+ VIEW  Comparison: None.  Findings: No fracture or spondylolisthesis is noted.  No significant degenerative changes are noted.  Disc spaces are well maintained.  IMPRESSION: Normal lumbar spine.   Original Report Authenticated By: Lupita Raider.,  M.D.    MDM   1. Lumbar strain, initial encounter    Patient presenting to emergency department with back pain that started yesterday but has progressively gotten worse since this morning. Patient was moving furniture around yesterday, not lifting from her legs from her back, twisting as well. Alert and oriented. Discomfort upon palpation to the mid spinal region of the lumbar spine and right paraspinal region of the lumbar spine muscular in nature. Full range of motion to upper  lower extremities bilaterally. Sensation intact. Strength intact. Pulses palpable, proximal and distal. Negative neurological deficits identified. Gait proper, mild limp identified with apply pressure to the right leg, negative sway. Plain film of lumbar spine negative for acute abnormalities or DDD. Doubt cauda equina syndrome. Doubt spinal cord compression. Suspicion to be muscular nature, lumbar strain secondary to heavy lifting with improper technique. Pain controlled in ED setting. Patient stable, afebrile. Discharged patient with prednisone and muscle relaxers. Discussed with patient to apply heat. Discussed with patient to apply icy hot and massage with exercises to stretch out the back. [facility]-discussed with patient that she may need physical therapy or electrical stimulation if the back pain is to worsen or continue. Discussed with patient to continue to monitor symptoms closely if symptoms are to worsen or change to report back  to emergency department-strict return instructions given. Patient agreed to plan of care, understood, all questions answered.     Raymon Mutton, PA-C 03/10/13 1528

## 2013-03-09 NOTE — ED Notes (Signed)
Pt c/o low back pain. Pt reports moving furniture last night when symptoms began. Pt reports about 3 months her son jumped on her back causing pain. Pt ambulatory in triage.

## 2013-03-10 NOTE — ED Provider Notes (Signed)
Medical screening examination/treatment/procedure(s) were performed by non-physician practitioner and as supervising physician I was immediately available for consultation/collaboration.  Winnona Wargo, MD 03/10/13 1556 

## 2013-03-16 ENCOUNTER — Emergency Department: Payer: Self-pay | Admitting: Emergency Medicine

## 2013-03-16 LAB — BASIC METABOLIC PANEL
BUN: 13 mg/dL (ref 7–18)
Chloride: 107 mmol/L (ref 98–107)
Co2: 25 mmol/L (ref 21–32)
Creatinine: 0.85 mg/dL (ref 0.60–1.30)
EGFR (African American): >60
Osmolality: 277 (ref 275–301)
Potassium: 3.7 mmol/L (ref 3.5–5.1)

## 2013-03-16 LAB — URINALYSIS, COMPLETE
Bilirubin,UR: NEGATIVE
Glucose,UR: NEGATIVE mg/dL (ref 0–75)
Ketone: NEGATIVE
Nitrite: NEGATIVE
Ph: 5 (ref 4.5–8.0)
Specific Gravity: 1.027 (ref 1.003–1.030)
Squamous Epithelial: 41

## 2013-03-16 LAB — CBC
MCH: 31.9 pg (ref 26.0–34.0)
MCHC: 33.9 g/dL (ref 32.0–36.0)
MCV: 94 fL (ref 80–100)
Platelet: 254 10*3/uL (ref 150–440)
RBC: 4.83 10*6/uL (ref 3.80–5.20)
RDW: 14.1 % (ref 11.5–14.5)
WBC: 9.4 10*3/uL (ref 3.6–11.0)

## 2013-03-16 LAB — PREGNANCY, URINE: Pregnancy Test, Urine: NEGATIVE m[IU]/mL

## 2013-03-26 ENCOUNTER — Emergency Department: Payer: Self-pay | Admitting: Internal Medicine

## 2013-03-26 LAB — URINALYSIS, COMPLETE
Bilirubin,UR: NEGATIVE
Glucose,UR: NEGATIVE mg/dL (ref 0–75)
Ketone: NEGATIVE
Nitrite: NEGATIVE
Ph: 6 (ref 4.5–8.0)
RBC,UR: 981 /HPF (ref 0–5)
Specific Gravity: 1.008 (ref 1.003–1.030)
Squamous Epithelial: 9

## 2013-04-08 ENCOUNTER — Emergency Department (HOSPITAL_BASED_OUTPATIENT_CLINIC_OR_DEPARTMENT_OTHER): Payer: Medicaid Other

## 2013-04-08 ENCOUNTER — Encounter (HOSPITAL_BASED_OUTPATIENT_CLINIC_OR_DEPARTMENT_OTHER): Payer: Self-pay | Admitting: Emergency Medicine

## 2013-04-08 ENCOUNTER — Emergency Department (HOSPITAL_BASED_OUTPATIENT_CLINIC_OR_DEPARTMENT_OTHER)
Admission: EM | Admit: 2013-04-08 | Discharge: 2013-04-08 | Disposition: A | Discharge status: Home / Self Care | Payer: Medicaid Other | Attending: Emergency Medicine | Admitting: Emergency Medicine

## 2013-04-08 DIAGNOSIS — R110 Nausea: Secondary | ICD-10-CM | POA: Insufficient documentation

## 2013-04-08 DIAGNOSIS — S8990XA Unspecified injury of unspecified lower leg, initial encounter: Secondary | ICD-10-CM | POA: Insufficient documentation

## 2013-04-08 DIAGNOSIS — Z8742 Personal history of other diseases of the female genital tract: Secondary | ICD-10-CM | POA: Insufficient documentation

## 2013-04-08 DIAGNOSIS — M25562 Pain in left knee: Secondary | ICD-10-CM

## 2013-04-08 DIAGNOSIS — M25572 Pain in left ankle and joints of left foot: Secondary | ICD-10-CM

## 2013-04-08 DIAGNOSIS — J45909 Unspecified asthma, uncomplicated: Secondary | ICD-10-CM | POA: Insufficient documentation

## 2013-04-08 DIAGNOSIS — X500XXA Overexertion from strenuous movement or load, initial encounter: Secondary | ICD-10-CM | POA: Insufficient documentation

## 2013-04-08 DIAGNOSIS — Y92009 Unspecified place in unspecified non-institutional (private) residence as the place of occurrence of the external cause: Secondary | ICD-10-CM | POA: Insufficient documentation

## 2013-04-08 DIAGNOSIS — Y9389 Activity, other specified: Secondary | ICD-10-CM | POA: Insufficient documentation

## 2013-04-08 DIAGNOSIS — Z8619 Personal history of other infectious and parasitic diseases: Secondary | ICD-10-CM | POA: Insufficient documentation

## 2013-04-08 DIAGNOSIS — F17200 Nicotine dependence, unspecified, uncomplicated: Secondary | ICD-10-CM | POA: Insufficient documentation

## 2013-04-08 DIAGNOSIS — W19XXXA Unspecified fall, initial encounter: Secondary | ICD-10-CM

## 2013-04-08 MED ORDER — HYDROCODONE-ACETAMINOPHEN 5-325 MG PO TABS
ORAL_TABLET | ORAL | Status: AC
  Filled 2013-04-08: qty 1

## 2013-04-08 MED ORDER — TRAMADOL HCL 50 MG PO TABS: 50.0000 mg | ORAL_TABLET | Freq: Three times a day (TID) | ORAL | Status: DC | PRN

## 2013-04-08 MED ORDER — HYDROCODONE-ACETAMINOPHEN 5-325 MG PO TABS
1.0000 | ORAL_TABLET | Freq: Once | ORAL | Status: AC
  Administered 2013-04-08: 1 via ORAL

## 2013-04-08 NOTE — ED Notes (Signed)
Stepped on a paver and twisted her left ankle. Pain in her lower leg.

## 2013-04-08 NOTE — ED Provider Notes (Signed)
CSN: [id number]     Arrival date & time 04/08/13  1804 History  This chart was scribed for Coral Ceo, PA, working with Shanna Cisco, MD by Blanchard Kelch, ED Scribe. This patient was seen in room MH10/MH10 and the patient's care was started at 6:48 PM.    Chief Complaint  Patient presents with  . Leg Pain    Patient is a 32 y.o. female presenting with leg pain. The history is provided by the patient. No language interpreter was used.  Leg Pain Associated symptoms: no back pain, no fatigue, no fever and no neck pain     HPI Comments: Sabrina Andrews is a 32 y.o. female with a PMH of asthma who presents to the Emergency Department due to a left ankle injury that occurred today. She states that she stepped on a garden stone block wrong at her mother's house and inverted her left ankle, which led to the onset of immediate pain. No other injuries. She is now complaining of constant pain and swelling to the anterior, lateral, and posterior ankle throughout with radiation up her left calf to her left knee.  She describes the pain as sharp. It is worsened by movement. She states the pain is making her nauseous, however has had no emesis. She took a Tylenol two hours ago and has been applying cold compresses with mild relief. She denies any prior fractures or surgeries to the left ankle or knee in the past. She denies hip pain, numbness or tingling, weakness, loss of sensation, headache, dizziness, syncope, chest pain, SOB, or abdominal pain.    Past Medical History  Diagnosis Date  . HPV (human papilloma virus) infection   . S/P tubal ligation   . Asthma   . H/O LEEP (loop electrosurgical excision procedure) of cervix complicating pregnancy   . Vulvar dysplasia    Past Surgical History  Procedure Laterality Date  . Cholecystectomy    . Cesarean section    . Abdominal hysterectomy    . Appendectomy     No family history on file. History  Substance Use Topics  . Smoking status:  Current Every Day Smoker -- 0.50 packs/day    Types: Cigarettes  . Smokeless tobacco: Never Used  . Alcohol Use: No   OB History   Grav Para Term Preterm Abortions TAB SAB Ect Mult Living   3 1 1             Review of Systems  Constitutional: Negative for fever, chills, activity change, appetite change and fatigue.  HENT: Negative for trouble swallowing.   Respiratory: Negative for cough and shortness of breath.   Cardiovascular: Negative for chest pain and leg swelling.  Gastrointestinal: Positive for nausea. Negative for vomiting and abdominal pain.  Genitourinary: Negative for dysuria.  Musculoskeletal: Positive for arthralgias (left ankle), gait problem (due to pain) and joint swelling. Negative for back pain and neck pain.  Skin: Negative for color change and wound.  Neurological: Negative for dizziness, syncope, weakness, numbness and headaches.  Psychiatric/Behavioral: Negative for confusion.  All other systems reviewed and are negative.    Allergies  Ibuprofen and Nsaids  Home Medications   Current Outpatient Rx  Name  Route  Sig  Dispense  Refill  . acetaminophen (TYLENOL) 500 MG tablet   Oral   Take 1,000 mg by mouth every 6 (six) hours as needed for pain. For pain         . cyclobenzaprine (FLEXERIL) 10 MG tablet  Oral   Take 1 tablet (10 mg total) by mouth 2 (two) times daily as needed for muscle spasms.   20 tablet   0   . predniSONE (DELTASONE) 20 MG tablet      3 tabs po day one, then 2 tabs daily x 4 days   11 tablet   0    Triage Vitals: BP 119/80  Pulse 87  Temp(Src) 98 F (36.7 C) (Oral)  Resp 20  Ht 5\' 2"  (1.575 m)  Wt 149 lb (67.586 kg)  BMI 27.25 kg/m2  SpO2 100%  LMP 07/29/2011  Filed Vitals:   04/08/13 1815 04/08/13 2035  BP: 119/80 107/82  Pulse: 87 77  Temp: 98 F (36.7 C) 98 F (36.7 C)  TempSrc: Oral Oral  Resp: 20 18  Height: 5\' 2"  (1.575 m)   Weight: 149 lb (67.586 kg)   SpO2: 100% 99%    Physical Exam  Nursing  note and vitals reviewed. Constitutional: She is oriented to person, place, and time. She appears well-developed and well-nourished. No distress.  HENT:  Head: Normocephalic and atraumatic.  Eyes: Conjunctivae and EOM are normal. Right eye exhibits no discharge. Left eye exhibits no discharge.  Neck: Normal range of motion. Neck supple. No tracheal deviation present.  Cardiovascular: Normal rate, regular rhythm, normal heart sounds and intact distal pulses.  Exam reveals no gallop and no friction rub.   No murmur heard. Dorsalis pedis pulses present and equal bilaterally  Pulmonary/Chest: Effort normal. No respiratory distress. She has no wheezes. She has no rales. She exhibits no tenderness.  Abdominal: Soft. She exhibits no distension. There is no tenderness.  Musculoskeletal: Normal range of motion. She exhibits edema and tenderness.  Diffuse tenderness to palpation to the left anterior, lateral malleolus, and posterior ankle throughout. Tenderness to palpation to the anterior and posterior left knee throughout. Negative anterior and posterior drawer sign. No laxity with manipulation of the collateral ligaments bilaterally. Patient able to actively dorsiflex and plantar flex her left ankle without any difficulty or limitations. Patient able to actively flex and extend her left knee without any difficulty or limitations. No left calf tenderness. Negative Thompson test. No palpable gap to the left Achilles. No left hip tenderness.   Neurological: She is alert and oriented to person, place, and time.  Sensation intact in the lower extremities bilaterally  Skin: Skin is warm and dry.  Mild edema to the lateral left malleolus. No open wounds, erythema, or ecchymosis.  Psychiatric: She has a normal mood and affect. Her behavior is normal.    ED Course  Procedures (including critical care time)  DIAGNOSTIC STUDIES: Oxygen Saturation is 100% on room air, normal by my interpretation.     COORDINATION OF CARE: 6:54 PM -Will order left ankle and knee x-ray. Patient verbalizes understanding and agrees with treatment plan.    Labs Review Labs Reviewed - No data to display Imaging Review Dg Ankle Complete Left  04/08/2013   CLINICAL DATA:  Stepped on a stone, twisted ankle, pain.  EXAM: LEFT ANKLE COMPLETE - 3+ VIEW  COMPARISON:  None.  FINDINGS: There is no evidence of fracture, dislocation, or joint effusion. There is no evidence of arthropathy or other focal bone abnormality. Soft tissues are unremarkable.  IMPRESSION: Negative.   Electronically Signed   By: Davonna Belling M.D.   On: 04/08/2013 19:44   Dg Knee Complete 4 Views Left  04/08/2013   CLINICAL DATA:  Injured knee, posterior pain.  EXAM: LEFT KNEE -  COMPLETE 4+ VIEW  COMPARISON:  None.  FINDINGS: There is no evidence of fracture, dislocation, or joint effusion. There is no evidence of arthropathy or other focal bone abnormality. Soft tissues are unremarkable.  IMPRESSION: Negative.   Electronically Signed   By: Davonna Belling M.D.   On: 04/08/2013 19:44    EKG Interpretation   None       MDM   1. Left knee pain   2. Left ankle pain   3. Fall, initial encounter     Sabrina Andrews is a 32 y.o. female with a PMH of asthma who presents to the Emergency Department due to a left ankle injury that occurred today. X-rays of the left ankle and knee ordered to further evaluate. Vicodin ordered for pain.  Rechecks  8:00 PM = patient states that the Vicodin mildly improved her pain. Patient informed of results of x-rays. Crutches, knee sleeve and ankle brace ordered.   Etiology of left ankle and knee pain is possibly due to contusion vs sprain/strain.  There is no evidence of fracture or malalignment on x-rays. Patient was prescribed tramadol for outpatient management.  Patient states that she's had tramadol in the past and has been a great pain reliever for her. She states that she has swelling and difficulty  breathing with NSAID's. She states that Tylenol is not enough to control her pain at home. Patient was given referral to orthopedic surgery and instructed to make an appointment if her symptoms are not improving or worsening. She was instructed to rest, ice, elevate, and tramadol or Tylenol for pain. Patient was instructed to return to the emergency department if she develops any weakness, spreading redness or swelling, fever, or any other concerns.  Patient was in agreement with discharge and plan.  She is in the emergency room with her significant other who is giving her a ride home.   Final impressions: 1. left knee pain 2. left ankle pain  3. Fall    Luiz Iron PA-C   I personally performed the services described in this documentation, which was scribed in my presence. The recorded information has been reviewed and is accurate.     Jillyn Ledger, PA-C 04/11/13 [phone]

## 2013-04-08 NOTE — ED Notes (Signed)
Patient transported to X-ray via stretcher per tech. 

## 2013-04-08 NOTE — ED Notes (Signed)
MD at bedside. 

## 2013-04-15 NOTE — ED Provider Notes (Signed)
Medical screening examination/treatment/procedure(s) were performed by non-physician practitioner and as supervising physician I was immediately available for consultation/collaboration.  Shanna Cisco, MD 04/15/13 [phone]

## 2013-08-13 ENCOUNTER — Encounter (HOSPITAL_BASED_OUTPATIENT_CLINIC_OR_DEPARTMENT_OTHER): Payer: Self-pay | Admitting: Emergency Medicine

## 2013-08-13 ENCOUNTER — Emergency Department (HOSPITAL_BASED_OUTPATIENT_CLINIC_OR_DEPARTMENT_OTHER)
Admission: EM | Admit: 2013-08-13 | Discharge: 2013-08-13 | Discharge status: Against Medical Advice | Payer: Medicaid Other | Attending: Emergency Medicine | Admitting: Emergency Medicine

## 2013-08-13 DIAGNOSIS — J45909 Unspecified asthma, uncomplicated: Secondary | ICD-10-CM | POA: Insufficient documentation

## 2013-08-13 DIAGNOSIS — F17200 Nicotine dependence, unspecified, uncomplicated: Secondary | ICD-10-CM | POA: Insufficient documentation

## 2013-08-13 DIAGNOSIS — Y929 Unspecified place or not applicable: Secondary | ICD-10-CM | POA: Insufficient documentation

## 2013-08-13 DIAGNOSIS — IMO0002 Reserved for concepts with insufficient information to code with codable children: Secondary | ICD-10-CM | POA: Insufficient documentation

## 2013-08-13 DIAGNOSIS — Y939 Activity, unspecified: Secondary | ICD-10-CM | POA: Insufficient documentation

## 2013-08-13 DIAGNOSIS — W010XXA Fall on same level from slipping, tripping and stumbling without subsequent striking against object, initial encounter: Secondary | ICD-10-CM | POA: Insufficient documentation

## 2013-08-13 NOTE — ED Notes (Signed)
Slipped on ice last night and fell on the ice. Pain in her lower back and buttocks.

## 2013-08-13 NOTE — ED Notes (Signed)
No answer when called to treatment room.  

## 2013-08-18 ENCOUNTER — Encounter (HOSPITAL_BASED_OUTPATIENT_CLINIC_OR_DEPARTMENT_OTHER): Payer: Self-pay | Admitting: Emergency Medicine

## 2013-08-18 ENCOUNTER — Emergency Department (HOSPITAL_BASED_OUTPATIENT_CLINIC_OR_DEPARTMENT_OTHER)
Admission: EM | Admit: 2013-08-18 | Discharge: 2013-08-18 | Disposition: A | Discharge status: Home / Self Care | Payer: Medicaid Other | Attending: Emergency Medicine | Admitting: Emergency Medicine

## 2013-08-18 ENCOUNTER — Emergency Department (HOSPITAL_BASED_OUTPATIENT_CLINIC_OR_DEPARTMENT_OTHER): Payer: Medicaid Other

## 2013-08-18 DIAGNOSIS — S39012A Strain of muscle, fascia and tendon of lower back, initial encounter: Secondary | ICD-10-CM

## 2013-08-18 DIAGNOSIS — Z9851 Tubal ligation status: Secondary | ICD-10-CM | POA: Insufficient documentation

## 2013-08-18 DIAGNOSIS — Z8619 Personal history of other infectious and parasitic diseases: Secondary | ICD-10-CM | POA: Insufficient documentation

## 2013-08-18 DIAGNOSIS — S335XXA Sprain of ligaments of lumbar spine, initial encounter: Secondary | ICD-10-CM | POA: Insufficient documentation

## 2013-08-18 DIAGNOSIS — Y929 Unspecified place or not applicable: Secondary | ICD-10-CM | POA: Insufficient documentation

## 2013-08-18 DIAGNOSIS — W010XXA Fall on same level from slipping, tripping and stumbling without subsequent striking against object, initial encounter: Secondary | ICD-10-CM | POA: Insufficient documentation

## 2013-08-18 DIAGNOSIS — Y9301 Activity, walking, marching and hiking: Secondary | ICD-10-CM | POA: Insufficient documentation

## 2013-08-18 DIAGNOSIS — Z8742 Personal history of other diseases of the female genital tract: Secondary | ICD-10-CM | POA: Insufficient documentation

## 2013-08-18 DIAGNOSIS — F17200 Nicotine dependence, unspecified, uncomplicated: Secondary | ICD-10-CM | POA: Insufficient documentation

## 2013-08-18 DIAGNOSIS — J45909 Unspecified asthma, uncomplicated: Secondary | ICD-10-CM | POA: Insufficient documentation

## 2013-08-18 DIAGNOSIS — IMO0002 Reserved for concepts with insufficient information to code with codable children: Secondary | ICD-10-CM | POA: Insufficient documentation

## 2013-08-18 MED ORDER — CYCLOBENZAPRINE HCL 10 MG PO TABS
10.0000 mg | ORAL_TABLET | Freq: Once | ORAL | Status: AC
  Administered 2013-08-18: 10 mg via ORAL
  Filled 2013-08-18: qty 1

## 2013-08-18 MED ORDER — CYCLOBENZAPRINE HCL 10 MG PO TABS: 10.0000 mg | ORAL_TABLET | Freq: Two times a day (BID) | ORAL | Status: DC | PRN

## 2013-08-18 NOTE — Discharge Instructions (Signed)
Back Pain, Adult Low back pain is very common. About 1 in 5 people have back pain.The cause of low back pain is rarely dangerous. The pain often gets better over time.About half of people with a sudden onset of back pain feel better in just 2 weeks. About 8 in 10 people feel better by 6 weeks.  CAUSES Some common causes of back pain include:  Strain of the muscles or ligaments supporting the spine.  Wear and tear (degeneration) of the spinal discs.  Arthritis.  Direct injury to the back. DIAGNOSIS Most of the time, the direct cause of low back pain is not known.However, back pain can be treated effectively even when the exact cause of the pain is unknown.Answering your caregiver's questions about your overall health and symptoms is one of the most accurate ways to make sure the cause of your pain is not dangerous. If your caregiver needs more information, he or she may order lab work or imaging tests (X-rays or MRIs).However, even if imaging tests show changes in your back, this usually does not require surgery. HOME CARE INSTRUCTIONS For many people, back pain returns.Since low back pain is rarely dangerous, it is often a condition that people can learn to manageon their own.   Remain active. It is stressful on the back to sit or stand in one place. Do not sit, drive, or stand in one place for more than 30 minutes at a time. Take short walks on level surfaces as soon as pain allows.Try to increase the length of time you walk each day.  Do not stay in bed.Resting more than 1 or 2 days can delay your recovery.  Do not avoid exercise or work.Your body is made to move.It is not dangerous to be active, even though your back may hurt.Your back will likely heal faster if you return to being active before your pain is gone.  Pay attention to your body when you bend and lift. Many people have less discomfortwhen lifting if they bend their knees, keep the load close to their bodies,and  avoid twisting. Often, the most comfortable positions are those that put less stress on your recovering back.  Find a comfortable position to sleep. Use a firm mattress and lie on your side with your knees slightly bent. If you lie on your back, put a pillow under your knees.  Only take over-the-counter or prescription medicines as directed by your caregiver. Over-the-counter medicines to reduce pain and inflammation are often the most helpful.Your caregiver may prescribe muscle relaxant drugs.These medicines help dull your pain so you can more quickly return to your normal activities and healthy exercise.  Put ice on the injured area.  Put ice in a plastic bag.  Place a towel between your skin and the bag.  Leave the ice on for 15-20 minutes, 03-04 times a day for the first 2 to 3 days. After that, ice and heat may be alternated to reduce pain and spasms.  Ask your caregiver about trying back exercises and gentle massage. This may be of some benefit.  Avoid feeling anxious or stressed.Stress increases muscle tension and can worsen back pain.It is important to recognize when you are anxious or stressed and learn ways to manage it.Exercise is a great option. SEEK MEDICAL CARE IF:  You have pain that is not relieved with rest or medicine.  You have pain that does not improve in 1 week.  You have new symptoms.  You are generally not feeling well. SEEK   IMMEDIATE MEDICAL CARE IF:   You have pain that radiates from your back into your legs.  You develop new bowel or bladder control problems.  You have unusual weakness or numbness in your arms or legs.  You develop nausea or vomiting.  You develop abdominal pain.  You feel faint. Document Released: 06/13/2005 Document Revised: 12/13/2011 Document Reviewed: 11/01/2010 [employer] Patient Information 2014 [employer].  

## 2013-08-18 NOTE — ED Notes (Signed)
Patient fell this morning on ice, landed on her left buttock. Now shes having left lower back pain that radiates down the back of her thigh

## 2013-08-18 NOTE — ED Provider Notes (Signed)
CSN: [id number]     Arrival date & time 08/18/13  2008 History  This chart was scribed for Sabrina Pollack, MD by Vernell Barrier, ED scribe. This patient was seen in room MH06/MH06 and the patient's care was started at 10:08 PM.     Chief Complaint  Patient presents with  . Back Pain   Patient is a 33 y.o. female presenting with back pain. The history is provided by the patient. No language interpreter was used.  Back Pain Location:  Lumbar spine Quality:  Shooting Radiates to:  Does not radiate Pain severity:  Moderate Pain is:  Same all the time Onset quality:  Sudden Duration:  8 hours Timing:  Constant Progression:  Worsening Chronicity:  New Context: falling   Relieved by:  Nothing Worsened by:  Movement Ineffective treatments:  Heating pad and NSAIDs Associated symptoms: no numbness and no weakness    HPI Comments: Sabrina Andrews is a 33 y.o. female who presents to the Emergency Department complaining of gradually worsening localized lower back pain following a fall on ice, onset 8 hours ago. Pt states states she was walking ans slipped, her feet went up in the air and she landed on her butt. States she felt a little pain immediately following but it wasn't too bad and she could still walk. States she then went to move the couch in her living room and felt like something pulled in her back which made the pain much worse. Took tylenol and used a heating pad for pain with little relief. Denies numbness, weakness, tingling.    Past Medical History  Diagnosis Date  . HPV (human papilloma virus) infection   . S/P tubal ligation   . Asthma   . H/O LEEP (loop electrosurgical excision procedure) of cervix complicating pregnancy   . Vulvar dysplasia    Past Surgical History  Procedure Laterality Date  . Cholecystectomy    . Cesarean section    . Abdominal hysterectomy    . Appendectomy     No family history on file. History  Substance Use Topics  . Smoking status: Current  Every Day Smoker -- 0.50 packs/day    Types: Cigarettes  . Smokeless tobacco: Never Used  . Alcohol Use: No   OB History   Grav Para Term Preterm Abortions TAB SAB Ect Mult Living   3 1 1             Review of Systems  Musculoskeletal: Positive for back pain.  Neurological: Negative for weakness and numbness.  All other systems reviewed and are negative.   Allergies  Ibuprofen and Nsaids  Home Medications   Current Outpatient Rx  Name  Route  Sig  Dispense  Refill  . acetaminophen (TYLENOL) 500 MG tablet   Oral   Take 1,000 mg by mouth every 6 (six) hours as needed for pain. For pain         . cyclobenzaprine (FLEXERIL) 10 MG tablet   Oral   Take 1 tablet (10 mg total) by mouth 2 (two) times daily as needed for muscle spasms.   20 tablet   0   . predniSONE (DELTASONE) 20 MG tablet      3 tabs po day one, then 2 tabs daily x 4 days   11 tablet   0   . traMADol (ULTRAM) 50 MG tablet   Oral   Take 1 tablet (50 mg total) by mouth every 8 (eight) hours as needed for pain.  9 tablet   0    Triage Vitals: BP 106/79  Pulse 87  Temp(Src) 98 F (36.7 C) (Oral)  SpO2 99%  LMP 07/29/2011  Physical Exam  Nursing note and vitals reviewed. Constitutional: She is oriented to person, place, and time. She appears well-developed and well-nourished.  HENT:  Head: Normocephalic and atraumatic.  Right Ear: External ear normal.  Left Ear: External ear normal.  Nose: Nose normal.  Mouth/Throat: Oropharynx is clear and moist.  Eyes: Conjunctivae and EOM are normal. Pupils are equal, round, and reactive to light.  Neck: Normal range of motion.  Cardiovascular: Normal rate, regular rhythm, normal heart sounds and intact distal pulses.   Pulmonary/Chest: Effort normal and breath sounds normal. No respiratory distress.  Musculoskeletal: Normal range of motion.       Lumbar back: She exhibits tenderness and pain.  BACK: sensory intact in lower extremities. Tenderness to lumbar  spine.  Lymphadenopathy:    She has no cervical adenopathy.  Neurological: She is oriented to person, place, and time. She has normal reflexes.  Skin: Skin is warm and dry.  Psychiatric: She has a normal mood and affect. Her behavior is normal.   ED Course  Procedures (including critical care time) DIAGNOSTIC STUDIES: Oxygen Saturation is 99% on room air, normal by my interpretation.    COORDINATION OF CARE: At 10:10 PM: Discussed treatment plan with patient which includes an x-Coni Homesley. Patient agrees.   Labs Review Imaging Review Dg Pelvis 1-2 Views  08/18/2013   CLINICAL DATA:  33 year old female with pelvic pain following fall.  EXAM: PELVIS - 1-2 VIEW  COMPARISON:  None.  FINDINGS: There is no evidence of pelvic fracture or diastasis. No other pelvic bone lesions are seen.  IMPRESSION: Negative.   Electronically Signed   By: Sabrina Andrews M.D.   On: 08/18/2013 22:50      MDM   Final diagnoses:  Lumbar strain    I personally performed the services described in this documentation, which was scribed in my presence. The recorded information has been reviewed and considered.      Sabrina Pollack, MD 08/18/13 [phone]

## 2013-09-16 ENCOUNTER — Emergency Department (HOSPITAL_BASED_OUTPATIENT_CLINIC_OR_DEPARTMENT_OTHER)
Admission: EM | Admit: 2013-09-16 | Discharge: 2013-09-16 | Disposition: A | Discharge status: Home / Self Care | Payer: Medicaid Other | Attending: Emergency Medicine | Admitting: Emergency Medicine

## 2013-09-16 ENCOUNTER — Encounter (HOSPITAL_BASED_OUTPATIENT_CLINIC_OR_DEPARTMENT_OTHER): Payer: Self-pay | Admitting: Emergency Medicine

## 2013-09-16 DIAGNOSIS — Z8742 Personal history of other diseases of the female genital tract: Secondary | ICD-10-CM | POA: Insufficient documentation

## 2013-09-16 DIAGNOSIS — R6884 Jaw pain: Secondary | ICD-10-CM | POA: Insufficient documentation

## 2013-09-16 DIAGNOSIS — Z8619 Personal history of other infectious and parasitic diseases: Secondary | ICD-10-CM | POA: Insufficient documentation

## 2013-09-16 DIAGNOSIS — F17200 Nicotine dependence, unspecified, uncomplicated: Secondary | ICD-10-CM | POA: Insufficient documentation

## 2013-09-16 DIAGNOSIS — K089 Disorder of teeth and supporting structures, unspecified: Secondary | ICD-10-CM | POA: Insufficient documentation

## 2013-09-16 DIAGNOSIS — K0889 Other specified disorders of teeth and supporting structures: Secondary | ICD-10-CM

## 2013-09-16 DIAGNOSIS — Z9851 Tubal ligation status: Secondary | ICD-10-CM | POA: Insufficient documentation

## 2013-09-16 DIAGNOSIS — J45909 Unspecified asthma, uncomplicated: Secondary | ICD-10-CM | POA: Insufficient documentation

## 2013-09-16 MED ORDER — TRAMADOL HCL 50 MG PO TABS: 50.0000 mg | ORAL_TABLET | Freq: Four times a day (QID) | ORAL | Status: DC | PRN

## 2013-09-16 MED ORDER — TRAMADOL HCL 50 MG PO TABS
50.0000 mg | ORAL_TABLET | Freq: Once | ORAL | Status: AC
  Administered 2013-09-16: 50 mg via ORAL
  Filled 2013-09-16: qty 1

## 2013-09-16 MED ORDER — PENICILLIN V POTASSIUM 500 MG PO TABS: 500.0000 mg | ORAL_TABLET | Freq: Four times a day (QID) | ORAL | Status: DC

## 2013-09-16 MED ORDER — PENICILLIN V POTASSIUM 250 MG PO TABS
500.0000 mg | ORAL_TABLET | Freq: Once | ORAL | Status: AC
  Administered 2013-09-16: 500 mg via ORAL
  Filled 2013-09-16: qty 2

## 2013-09-16 NOTE — Discharge Instructions (Signed)
Take Tramadol as needed for pain. Take Veetid as directed until gone. Refer to attached documents for more information. Follow up with a dentist from the resource guide below.    Emergency Department Resource Guide 1) Find a Doctor and Pay Out of Pocket Although you won't have to find out who is covered by your insurance plan, it is a good idea to ask around and get recommendations. You will then need to call the office and see if the doctor you have chosen will accept you as a new patient and what types of options they offer for patients who are self-pay. Some doctors offer discounts or will set up payment plans for their patients who do not have insurance, but you will need to ask so you aren't surprised when you get to your appointment.  2) Contact Your Local Health Department Not all health departments have doctors that can see patients for sick visits, but many do, so it is worth a call to see if yours does. If you don't know where your local health department is, you can check in your phone book. The CDC also has a tool to help you locate your state's health department, and many state websites also have listings of all of their local health departments.  3) Find a [employer] [facility] If your illness is not likely to be very severe or complicated, you may want to try a walk in clinic. These are popping up all over the country in pharmacies, drugstores, and shopping centers. They're usually staffed by nurse practitioners or physician assistants that have been trained to treat common illnesses and complaints. They're usually fairly quick and inexpensive. However, if you have serious medical issues or chronic medical problems, these are probably not your best option.  No Primary Care Doctor: - Call Health Connect at  [phone] - they can help you locate a primary care doctor that  accepts your insurance, provides certain services, etc. - Physician Referral Service- [phone]  Chronic Pain  Problems: Organization         Address  Phone   Notes  [facility]  [phone] Patients need to be referred by their primary care doctor.   Medication Assistance: Organization         Address  Phone   Notes  [facility] Medication [facility] [address] [place], [place] [address] [phone] --Must be a resident of [facility] -- Must have NO insurance coverage whatsoever (no Medicaid/ Medicare, etc.) -- The pt. MUST have a primary care doctor that directs their care regularly and follows them in the community   Med[facility]ist  [phone]   [employer]  [phone]    Agencies that provide inexpensive medical care: Organization         Address  Phone   Notes  [place]  [phone]   [facility]    [phone]   [facility] [place], [place] [address] [phone]   [place] [address] [place] [phone]   Planned Parenthood    [phone]   [facility]    [phone]   [place] and [address], [place] Phone:  [phone], Fax:  [phone] Hours of Operation:  9 am - 6 pm, M-F.  Also accepts Medicaid/Medicare and self-pay.  [facility] for [place]  [place], [address], [place] Phone: [phone], Fax: [phone]. Hours of Operation:  8:30 am - 5:30 pm, M-F.  Also accepts Medicaid and self-pay.  [facility] [place] [address], [place] Phone: [phone]   [place], [place], [place] [phone], Ext. [phone] Mondays & Thursdays: 7-9 AM.  First 15 patients are seen on a first come, first serve basis.    [place] Providers:  Organization         Address  Phone   Notes  [facility] [address], [place] [phone] Also  accepts self-pay patients.  [facility] [address], [place]  [phone]   [place], [address], [place] [phone]   [facility] [address] [place] [phone]   Lucianne Lei [address], [place]   [phone] Only accepts [place] Access [place] patients after they have their name applied to their card.   Self-Pay (no insurance) in [facility]:  Organization         Address  Phone   Notes  Sickle Cell Patients, [facility] [employer] [phone]   [facility] Care [place] [phone]   [facility] [place]  [place], [place], [place] [phone]   [facility]/Dr. Osei-Bonsu  [address] [place] or [address], [place] [phone] Phone number for both [place] and [place] locations is the same.  [facility] [address], [place] [phone]   [facility] [address] [place] or [address] [phone]   [facility] [address] [place] [phone], phone; [phone], fax Sees patients 1st and 3rd Saturday of every month.  Must not qualify for public or private insurance (i.e. Medicaid, Medicare, [place] Health Choice, Veterans' Benefits)  Household income should be no more than 200% of the poverty level The clinic cannot treat you if you are pregnant or think you are pregnant  Sexually transmitted diseases are not treated at the clinic.    Dental Care: Organization         Address  Phone  Notes  [facility] Department of [facility] [place] [phone] Accepts children up to age 58 who are enrolled in [place] or [place]; pregnant  women with a Medicaid card; and children who have applied for Medicaid or [place] Health Choice, but were declined, whose parents can pay a reduced fee at time of service.  [facility] Department of [facility]  [address], [place] [phone] Accepts children up to age 52 who are enrolled in [place] or [place]; pregnant women with a Medicaid card; and children who have applied for Medicaid or [place] Health Choice, but were declined, whose parents can pay a reduced fee at time of service.  [facility] PROGRAM  [place] [phone] Patients are seen by appointment only. Walk-ins are not accepted. [place] will see patients 8 years of age and older. Monday - Tuesday (8am-5pm) Most Wednesdays (8:30-5pm) $30 per visit, cash only  [place]  PROGRAM  [address], [facility] [phone] Patients are seen by appointment only. Walk-ins are not accepted. [place] will see patients 73 years of age and older. One Wednesday Evening (Monthly: Volunteer Based).  $30 per visit, cash only  [place]  [phone] for adults; Children under age 67, call Graduate Pediatric Dentistry at [phone]. Children aged 69-14, please call [phone] to request a pediatric application.  Dental services are provided in all areas of dental care including fillings, crowns and bridges, complete and partial dentures, implants, gum treatment, root canals, and extractions. Preventive care is also provided. Treatment is provided to both adults and children. Patients are selected via a lottery and there is often a waiting list.   [facility] [address], [place]  [phone] [email or web address]   [facility] [address] [place], [place] [phone], Ext. [phone] Second and Fourth Thursday of each month, opens at 6:30 AM; Clinic ends at 9 AM.  Patients are  seen on a first-come first-served basis, and a limited number are seen during each clinic.   [facility]  [address] [place] D[place]er [place], [place] [phone]   Eligibility Requirements You must have lived in [place], [place], or [place] counties for at least the last three months.   You cannot be eligible for state or federal sponsored [employer], including [employer], [place], or [employer].   You generally cannot be eligible for healthcare insurance through your employer.    How to apply: Eligibility screenings are held every Tuesday and Wednesday afternoon from 1:00 pm until 4:00 pm. You do not need an appointment for the interview!  [facility] [address], [place], [place]   [place] Department  [place]  [phone]    Behavioral Health Resources in the Community: Intensive Outpatient Programs Organization         Address  Phone  Notes  [place]. [address], [place], [place] [phone]   [facility] Outpatient [address] [place], [place]   ADS: Alcohol & Drug S[facility]cs [address], [place], [place]   [place] [address]  [place], [place] or [phone]   Substance Abuse Resources Organization         Address  Phone  Notes  Alcohol and Drug Services  [phone]   [place]  [phone]   [facility] [place]   Chinita Pester  [phone]   Residential & Outpatient Substance Abuse Program  [phone]   Psychological Services Organization         Address  Phone  Notes  [facility] [place]  [place]  [phone]   [place] [address], [place] or [phone]    Mobile Crisis  Teams Organization         Address  Phone  Notes  [facility], Mobile Crisis Care Unit  [phone]   [facility]  [address] [place], [place]   [place] [address], [place] [phone]    Self-Help/Support Groups Organization         Address  Phone             Notes  [place]  of [place] - variety of support groups  [place] Call for more information  Narcotics Anonymous (NA), [facility] [address], [employer] [place]  2 meetings at this location   Special educational needs teacher         Address  Phone  Notes  [facility]idential [facility] [place],    [place]  [phone]   [facility]  [address], [place] [phone], [place], [place]   [place], [place] [phone] Admissions: 8am-3pm M-F  Incentives Substance [place] [address]    [place], [place] [phone]   [facility] [address] [place], [place], [place]   [facility] [address],  [place], [place]   [facility] - Intensive Outpatient [address], [place], [place]   [facility] [place] ([place] [address] [address].,  [place], [place] [phone] or [phone]   [facility] (RTS) [address], [place], [place] Accepts Medicaid  [facility] [place] [address],  [place] [phone] Substance Abuse/Addiction Treatment   [facility] Organization         Address  Phone  Notes  [facility]  [phone]   Domenic Schwab, PhD [address] [place]lis Port[place], [place]   [phone] or [phone]   [place] [address] [place], [place] [phone]   [facility] [address], [place], [place] [phone]  Insurance/Medicaid/sponsorship through [facility] and Families [address]                                    [place], [place] [phone] [place] [address], [place] [phone]    Dr. Adele Schilder  [phone]   [facility] Dept. 1) [address], [place] 2) [place] 3)  [place] [address], [place] [phone]  [phone]   [place] [phone] or [phone] (After Hours)

## 2013-09-16 NOTE — ED Notes (Signed)
Pt c/o right lower dental pain and bilateral jaw pain with popping.

## 2013-09-16 NOTE — ED Provider Notes (Signed)
CSN: [id number]     Arrival date & time 09/16/13  1047 History   First MD Initiated Contact with Patient 09/16/13 1111     Chief Complaint  Patient presents with  . Dental Pain  . Jaw Pain     (Consider location/radiation/quality/duration/timing/severity/associated sxs/prior Treatment) HPI Comments: The patient is a 33 year old otherwise healthy female who presents with dental pain that started gradually one day ago. The dental pain is severe, constant and progressively worsening. The pain is aching and located in right lower jaw. The pain does not radiate. Eating makes the pain worse. Nothing makes the pain better. The patient has not tried anything for pain. Patient reports associated jaw popping with opening and closing of her jaw. Patient denies headache, neck pain/stiffness, fever, NVD, edema, sore throat, throat swelling, wheezing, SOB, chest pain, abdominal pain.      Past Medical History  Diagnosis Date  . HPV (human papilloma virus) infection   . S/P tubal ligation   . Asthma   . H/O LEEP (loop electrosurgical excision procedure) of cervix complicating pregnancy   . Vulvar dysplasia    Past Surgical History  Procedure Laterality Date  . Cholecystectomy    . Cesarean section    . Abdominal hysterectomy    . Appendectomy     No family history on file. History  Substance Use Topics  . Smoking status: Current Every Day Smoker -- 0.50 packs/day    Types: Cigarettes  . Smokeless tobacco: Never Used  . Alcohol Use: No   OB History   Grav Para Term Preterm Abortions TAB SAB Ect Mult Living   3 1 1             Review of Systems  Constitutional: Negative for fever, chills and fatigue.  HENT: Positive for dental problem. Negative for trouble swallowing.   Eyes: Negative for visual disturbance.  Respiratory: Negative for shortness of breath.   Cardiovascular: Negative for chest pain and palpitations.  Gastrointestinal: Negative for nausea, vomiting, abdominal pain and  diarrhea.  Genitourinary: Negative for dysuria and difficulty urinating.  Musculoskeletal: Negative for arthralgias and neck pain.  Skin: Negative for color change.  Neurological: Negative for dizziness and weakness.  Psychiatric/Behavioral: Negative for dysphoric mood.      Allergies  Ibuprofen and Nsaids  Home Medications   Current Outpatient Rx  Name  Route  Sig  Dispense  Refill  . acetaminophen (TYLENOL) 500 MG tablet   Oral   Take 1,000 mg by mouth every 6 (six) hours as needed for pain. For pain         . cyclobenzaprine (FLEXERIL) 10 MG tablet   Oral   Take 1 tablet (10 mg total) by mouth 2 (two) times daily as needed for muscle spasms.   20 tablet   0   . cyclobenzaprine (FLEXERIL) 10 MG tablet   Oral   Take 1 tablet (10 mg total) by mouth 2 (two) times daily as needed for muscle spasms.   20 tablet   0   . predniSONE (DELTASONE) 20 MG tablet      3 tabs po day one, then 2 tabs daily x 4 days   11 tablet   0   . traMADol (ULTRAM) 50 MG tablet   Oral   Take 1 tablet (50 mg total) by mouth every 8 (eight) hours as needed for pain.   9 tablet   0    BP 143/85  Pulse 75  Temp(Src) 97.4 F (36.3  C) (Oral)  Resp 18  SpO2 100%  LMP 07/29/2011 Physical Exam  Nursing note and vitals reviewed. Constitutional: She appears well-developed and well-nourished. No distress.  HENT:  Head: Normocephalic and atraumatic.  Mouth/Throat: Oropharynx is clear and moist. No oropharyngeal exudate.  Poor dentition. Multiple missing teeth. Right lower jaw tenderness to percussion. Palpation of the TMJ with open and closing of the jaw reveals a "popping" sensation.   Eyes: Conjunctivae and EOM are normal.  Neck: Normal range of motion.  Cardiovascular: Normal rate and regular rhythm.  Exam reveals no gallop and no friction rub.   No murmur heard. Pulmonary/Chest: Effort normal and breath sounds normal. She has no wheezes. She has no rales. She exhibits no tenderness.   Musculoskeletal: Normal range of motion.  Neurological: She is alert.  Speech is goal-oriented. Moves limbs without ataxia.   Skin: Skin is warm and dry.  Psychiatric: She has a normal mood and affect. Her behavior is normal.    ED Course  Procedures (including critical care time) Labs Review Labs Reviewed - No data to display Imaging Review No results found.   EKG Interpretation None      MDM   Final diagnoses:  Pain, dental    11:32 AM Patient will have tramadol and veetid for symptoms. Patient advised to follow up with a dentist from the resource guide. Vitals stable and patient afebrile.     Alvina Chou, PA-C 09/16/13 1139

## 2013-09-16 NOTE — ED Notes (Signed)
PA at bedside.

## 2013-09-17 NOTE — ED Provider Notes (Signed)
Medical screening examination/treatment/procedure(s) were performed by non-physician practitioner and as supervising physician I was immediately available for consultation/collaboration.     Veryl Speak, MD 09/17/13 [phone]

## 2014-03-16 IMAGING — CR DG ANKLE COMPLETE 3+V*L*
3 series · 3 of 3 positions shown · non-contrast
Comparison: None.

CLINICAL DATA: Stepped on a stone, twisted ankle, pain.

EXAM:
LEFT ANKLE COMPLETE - 3+ VIEW

[t ankle joint ap left]
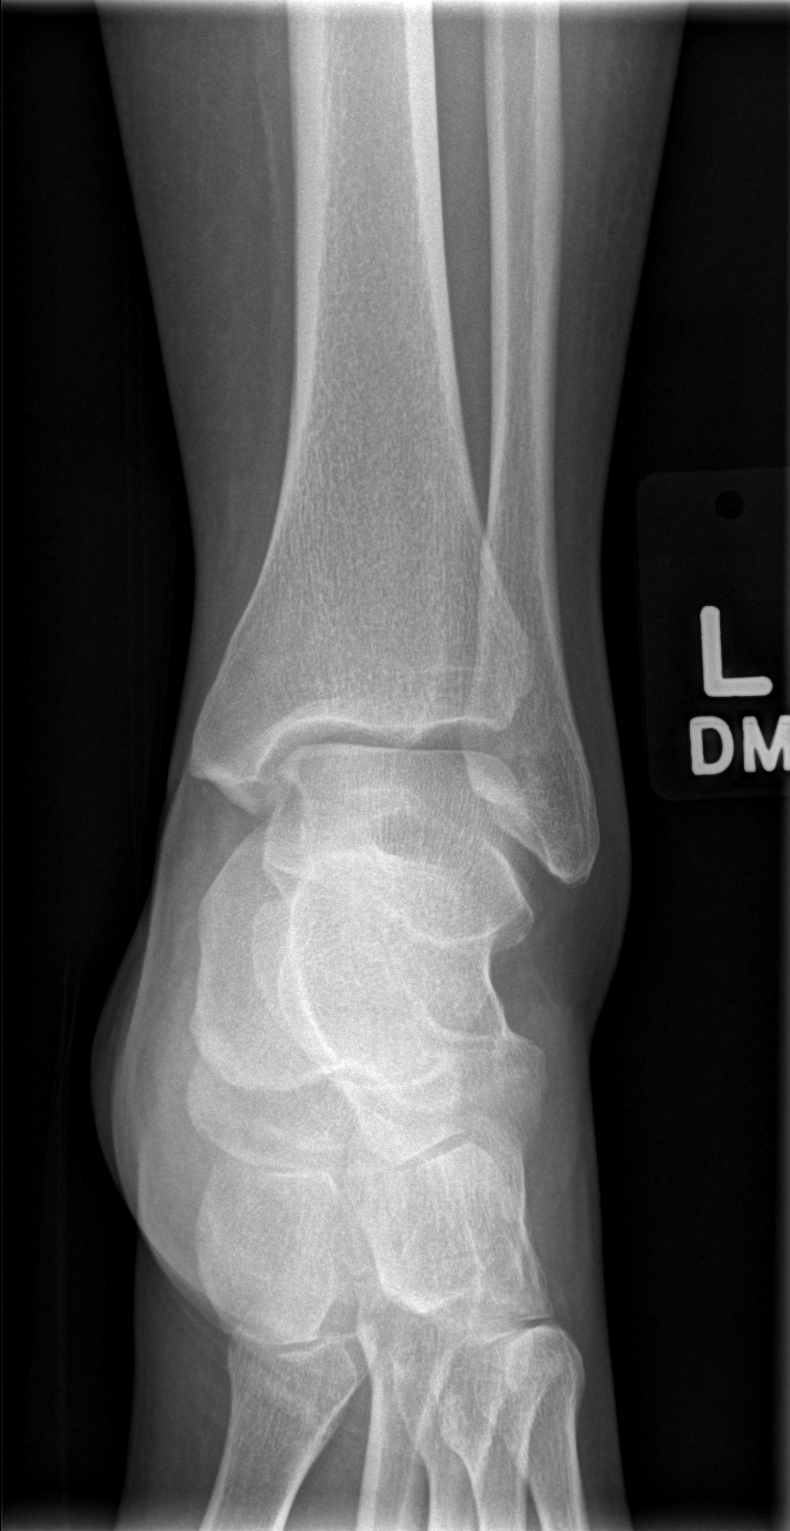

[t ankle joint oblique left]
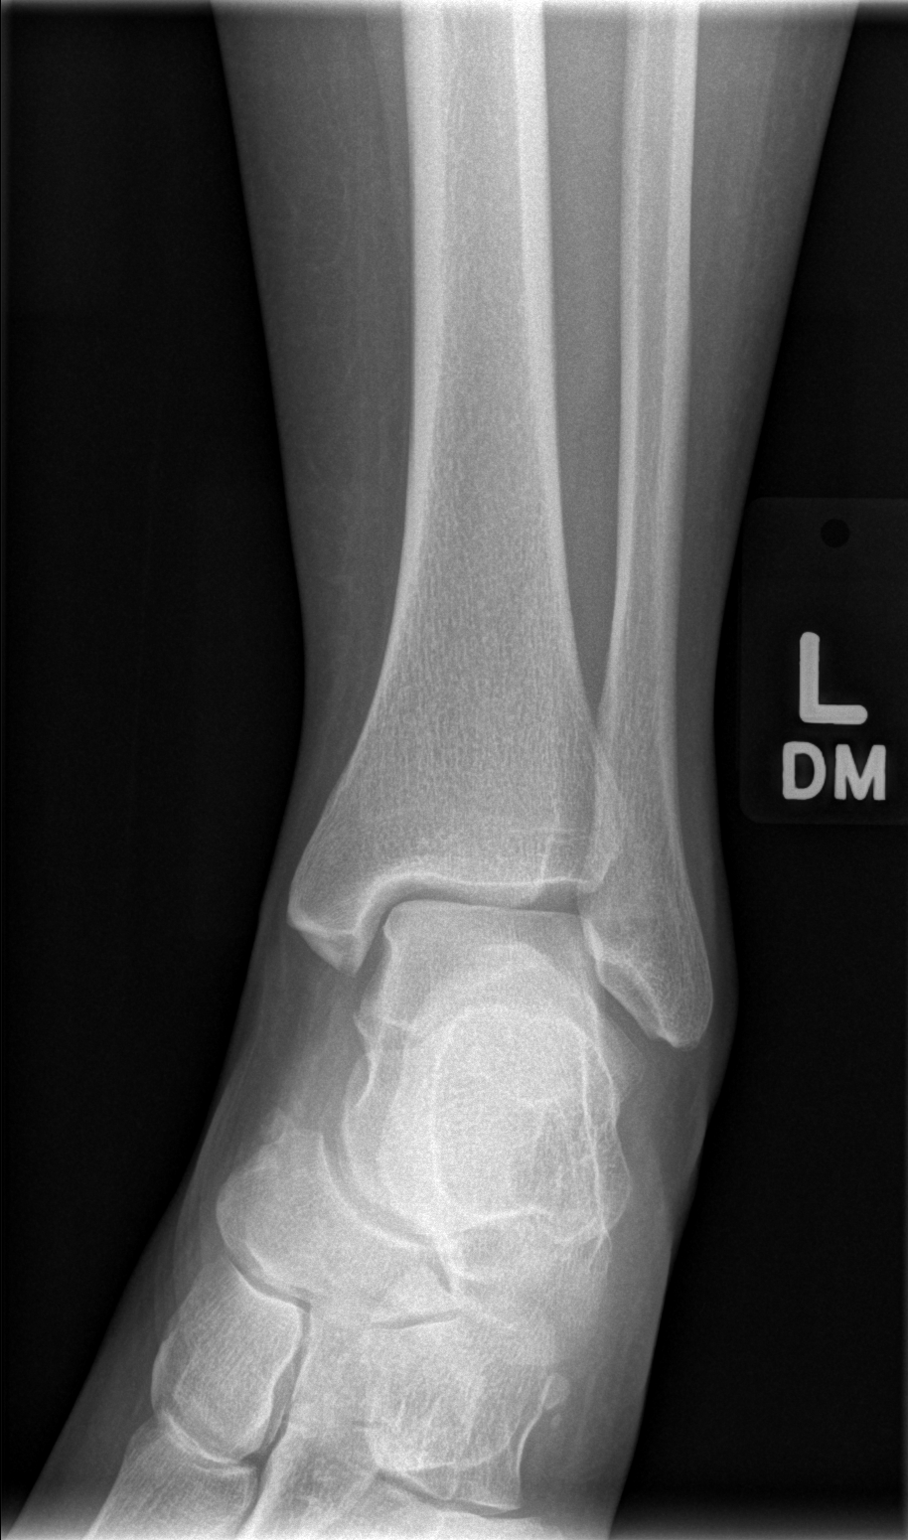

[t ankle joint lat left]
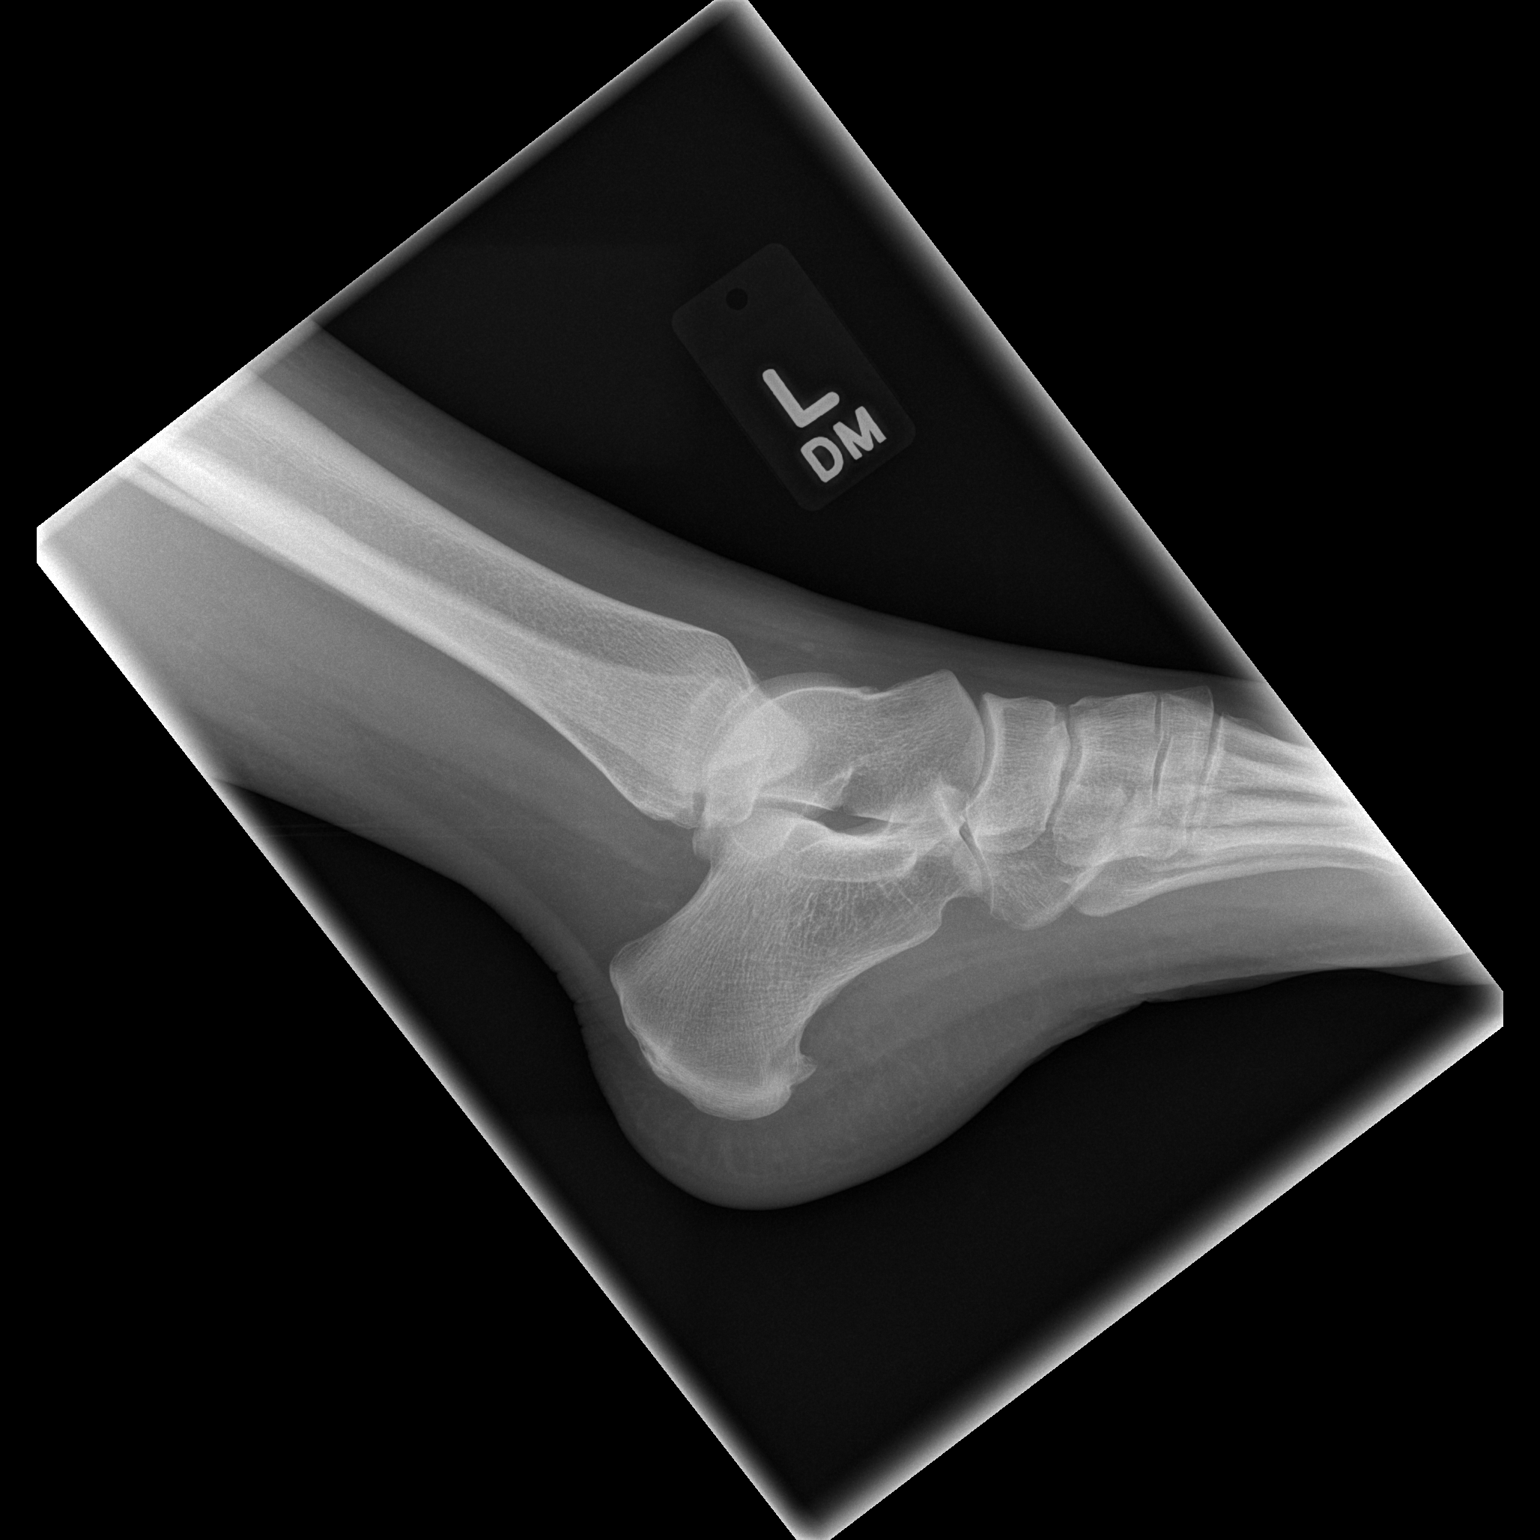

[3 of 3 positions shown; findings below may reference images not displayed]

FINDINGS: There is no evidence of fracture, dislocation, or joint effusion.
There is no evidence of arthropathy or other focal bone abnormality.
Soft tissues are unremarkable.
IMPRESSION: Negative.

## 2014-04-28 ENCOUNTER — Encounter (HOSPITAL_BASED_OUTPATIENT_CLINIC_OR_DEPARTMENT_OTHER): Payer: Self-pay | Admitting: Emergency Medicine

## 2014-05-12 ENCOUNTER — Emergency Department (HOSPITAL_COMMUNITY)
Admission: EM | Admit: 2014-05-12 | Discharge: 2014-05-12 | Disposition: A | Discharge status: Home / Self Care | Payer: Medicaid Other | Attending: Emergency Medicine | Admitting: Emergency Medicine

## 2014-05-12 ENCOUNTER — Encounter (HOSPITAL_COMMUNITY): Payer: Self-pay | Admitting: Emergency Medicine

## 2014-05-12 ENCOUNTER — Emergency Department (HOSPITAL_COMMUNITY): Payer: Medicaid Other

## 2014-05-12 DIAGNOSIS — S3992XA Unspecified injury of lower back, initial encounter: Secondary | ICD-10-CM | POA: Diagnosis present

## 2014-05-12 DIAGNOSIS — Z720 Tobacco use: Secondary | ICD-10-CM | POA: Insufficient documentation

## 2014-05-12 DIAGNOSIS — M549 Dorsalgia, unspecified: Secondary | ICD-10-CM

## 2014-05-12 DIAGNOSIS — Y9389 Activity, other specified: Secondary | ICD-10-CM | POA: Insufficient documentation

## 2014-05-12 DIAGNOSIS — M62830 Muscle spasm of back: Secondary | ICD-10-CM | POA: Diagnosis not present

## 2014-05-12 DIAGNOSIS — Z792 Long term (current) use of antibiotics: Secondary | ICD-10-CM | POA: Insufficient documentation

## 2014-05-12 DIAGNOSIS — Y998 Other external cause status: Secondary | ICD-10-CM | POA: Diagnosis not present

## 2014-05-12 DIAGNOSIS — S29012A Strain of muscle and tendon of back wall of thorax, initial encounter: Secondary | ICD-10-CM | POA: Diagnosis not present

## 2014-05-12 DIAGNOSIS — Z7952 Long term (current) use of systemic steroids: Secondary | ICD-10-CM | POA: Insufficient documentation

## 2014-05-12 DIAGNOSIS — J45909 Unspecified asthma, uncomplicated: Secondary | ICD-10-CM | POA: Insufficient documentation

## 2014-05-12 DIAGNOSIS — Z3202 Encounter for pregnancy test, result negative: Secondary | ICD-10-CM | POA: Diagnosis not present

## 2014-05-12 DIAGNOSIS — Y92410 Unspecified street and highway as the place of occurrence of the external cause: Secondary | ICD-10-CM | POA: Insufficient documentation

## 2014-05-12 DIAGNOSIS — S29019A Strain of muscle and tendon of unspecified wall of thorax, initial encounter: Secondary | ICD-10-CM

## 2014-05-12 DIAGNOSIS — D1809 Hemangioma of other sites: Secondary | ICD-10-CM

## 2014-05-12 LAB — POC URINE PREG, ED: PREG TEST UR: NEGATIVE

## 2014-05-12 MED ORDER — METHOCARBAMOL 750 MG PO TABS: 750.0000 mg | ORAL_TABLET | Freq: Four times a day (QID) | ORAL | Status: DC | PRN

## 2014-05-12 MED ORDER — METHOCARBAMOL 500 MG PO TABS
500.0000 mg | ORAL_TABLET | Freq: Once | ORAL | Status: AC
  Administered 2014-05-12: 500 mg via ORAL
  Filled 2014-05-12: qty 1

## 2014-05-12 MED ORDER — HYDROCODONE-ACETAMINOPHEN 5-325 MG PO TABS
2.0000 | ORAL_TABLET | Freq: Once | ORAL | Status: AC
  Administered 2014-05-12: 2 via ORAL
  Filled 2014-05-12: qty 2

## 2014-05-12 MED ORDER — HYDROCODONE-ACETAMINOPHEN 5-325 MG PO TABS: 1.0000 | ORAL_TABLET | Freq: Four times a day (QID) | ORAL | Status: DC | PRN
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
## 2014-05-12 NOTE — ED Notes (Signed)
Patient still in radiology.
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
## 2014-05-12 NOTE — Discharge Instructions (Signed)
1. Medications: robaxin, vicodin, usual home medications 2. Treatment: rest, drink plenty of fluids, gentle stretching as discussed, alternate ice and heat 3. Follow Up: Please followup with your primary doctor in 3 days for discussion of your diagnoses and further evaluation after today's visit; if you do not have a primary care doctor use the resource guide provided to find one;  Return to the ER for worsening back pain, difficulty walking, loss of bowel or bladder control or other concerning symptoms  Back Exercises Back exercises help treat and prevent back injuries. The goal of back exercises is to increase the strength of your abdominal and back muscles and the flexibility of your back. These exercises should be started when you no longer have back pain. Back exercises include:  Pelvic Tilt. Lie on your back with your knees bent. Tilt your pelvis until the lower part of your back is against the floor. Hold this position 5 to 10 sec and repeat 5 to 10 times.  Knee to Chest. Pull first 1 knee up against your chest and hold for 20 to 30 seconds, repeat this with the other knee, and then both knees. This may be done with the other leg straight or bent, whichever feels better.  Sit-Ups or Curl-Ups. Bend your knees 90 degrees. Start with tilting your pelvis, and do a partial, slow sit-up, lifting your trunk only 30 to 45 degrees off the floor. Take at least 2 to 3 seconds for each sit-up. Do not do sit-ups with your knees out straight. If partial sit-ups are difficult, simply do the above but with only tightening your abdominal muscles and holding it as directed.  Hip-Lift. Lie on your back with your knees flexed 90 degrees. Push down with your feet and shoulders as you raise your hips a couple inches off the floor; hold for 10 seconds, repeat 5 to 10 times.  Back arches. Lie on your stomach, propping yourself up on bent elbows. Slowly press on your hands, causing an arch in your low back. Repeat 3 to  5 times. Any initial stiffness and discomfort should lessen with repetition over time.  Shoulder-Lifts. Lie face down with arms beside your body. Keep hips and torso pressed to floor as you slowly lift your head and shoulders off the floor. Do not overdo your exercises, especially in the beginning. Exercises may cause you some mild back discomfort which lasts for a few minutes; however, if the pain is more severe, or lasts for more than 15 minutes, do not continue exercises until you see your caregiver. Improvement with exercise therapy for back problems is slow.  See your caregivers for assistance with developing a proper back exercise program. Document Released: 07/21/2004 Document Revised: 09/05/2011 Document Reviewed: 04/14/2011 [facility] Patient Information 2015 [place], [place]. This information is not intended to replace advice given to you by your health care provider. Make sure you discuss any questions you have with your health care provider.      Thoracic Strain You have injured the muscles or tendons that attach to the upper part of your back behind your chest. This injury is called a thoracic strain, thoracic sprain, or mid-back strain.  CAUSES  The cause of thoracic strain varies. A less severe injury involves pulling a muscle or tendon without tearing it. A more severe injury involves tearing (rupturing) a muscle or tendon. With less severe injuries, there may be little loss of strength. Sometimes, there are breaks (fractures) in the bones to which the muscles are attached.  These fractures are rare, unless there was a direct hit (trauma) or you have weak bones due to osteoporosis or age. Longstanding strains may be caused by overuse or improper form during certain movements. Obesity can also increase your risk for back injuries. Sudden strains may occur due to injury or not warming up properly before exercise. Often, there is no obvious cause for a thoracic strain. SYMPTOMS  The main  symptom is pain, especially with movement, such as during exercise. DIAGNOSIS  Your caregiver can usually tell what is wrong by taking an X-ray and doing a physical exam. TREATMENT   Physical therapy may be helpful for recovery. Your caregiver can give you exercises to do or refer you to a physical therapist after your pain improves.  After your pain improves, strengthening and conditioning programs appropriate for your sport or occupation may be helpful.  Always warm up before physical activities or athletics. Stretching after physical activity may also help.  Certain over-the-counter medicines may also help. Ask your caregiver if there are medicines that would help you. If this is your first thoracic strain injury, proper care and proper healing time before starting activities should prevent long-term problems. Torn ligaments and tendons require as long to heal as broken bones. Average healing times may be only 1 week for a mild strain. For torn muscles and tendons, healing time may be up to 6 weeks to 2 months. HOME CARE INSTRUCTIONS   Apply ice to the injured area. Ice massages may also be used as directed.  Put ice in a plastic bag.  Place a towel between your skin and the bag.  Leave the ice on for 15-20 minutes, 03-04 times a day, for the first 2 days.  Only take over-the-counter or prescription medicines for pain, discomfort, or fever as directed by your caregiver.  Keep your appointments for physical therapy if this was prescribed.  Use wraps and back braces as instructed. SEEK IMMEDIATE MEDICAL CARE IF:   You have an increase in bruising, swelling, or pain.  Your pain has not improved with medicines.  You develop new shortness of breath, chest pain, or fever.  Problems seem to be getting worse rather than better. MAKE SURE YOU:   Understand these instructions.  Will watch your condition.  Will get help right away if you are not doing well or get worse. Document  Released: 09/03/2003 Document Revised: 09/05/2011 Document Reviewed: 07/30/2010 [facility] Patient Information 2015 [place], [place]. This information is not intended to replace advice given to you by your health care provider. Make sure you discuss any questions you have with your health care provider.
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
## 2014-05-13 ENCOUNTER — Telehealth (HOSPITAL_BASED_OUTPATIENT_CLINIC_OR_DEPARTMENT_OTHER): Payer: Self-pay | Admitting: Emergency Medicine

## 2014-08-24 ENCOUNTER — Emergency Department (HOSPITAL_COMMUNITY): Payer: Medicaid Other

## 2014-08-24 ENCOUNTER — Encounter (HOSPITAL_COMMUNITY): Payer: Self-pay | Admitting: Emergency Medicine

## 2014-08-24 ENCOUNTER — Emergency Department (HOSPITAL_COMMUNITY)
Admission: EM | Admit: 2014-08-24 | Discharge: 2014-08-24 | Disposition: A | Discharge status: Home / Self Care | Payer: Medicaid Other | Attending: Emergency Medicine | Admitting: Emergency Medicine

## 2014-08-24 DIAGNOSIS — Z9049 Acquired absence of other specified parts of digestive tract: Secondary | ICD-10-CM | POA: Insufficient documentation

## 2014-08-24 DIAGNOSIS — A599 Trichomoniasis, unspecified: Secondary | ICD-10-CM | POA: Diagnosis not present

## 2014-08-24 DIAGNOSIS — J45909 Unspecified asthma, uncomplicated: Secondary | ICD-10-CM | POA: Diagnosis not present

## 2014-08-24 DIAGNOSIS — Z79899 Other long term (current) drug therapy: Secondary | ICD-10-CM | POA: Diagnosis not present

## 2014-08-24 DIAGNOSIS — Z9851 Tubal ligation status: Secondary | ICD-10-CM | POA: Insufficient documentation

## 2014-08-24 DIAGNOSIS — R102 Pelvic and perineal pain: Secondary | ICD-10-CM

## 2014-08-24 DIAGNOSIS — A64 Unspecified sexually transmitted disease: Secondary | ICD-10-CM

## 2014-08-24 DIAGNOSIS — Z720 Tobacco use: Secondary | ICD-10-CM | POA: Diagnosis not present

## 2014-08-24 DIAGNOSIS — A568 Sexually transmitted chlamydial infection of other sites: Secondary | ICD-10-CM | POA: Diagnosis not present

## 2014-08-24 DIAGNOSIS — Z90710 Acquired absence of both cervix and uterus: Secondary | ICD-10-CM | POA: Diagnosis not present

## 2014-08-24 DIAGNOSIS — Z7952 Long term (current) use of systemic steroids: Secondary | ICD-10-CM | POA: Insufficient documentation

## 2014-08-24 DIAGNOSIS — N200 Calculus of kidney: Secondary | ICD-10-CM | POA: Insufficient documentation

## 2014-08-24 DIAGNOSIS — Z792 Long term (current) use of antibiotics: Secondary | ICD-10-CM | POA: Insufficient documentation

## 2014-08-24 LAB — URINALYSIS, ROUTINE W REFLEX MICROSCOPIC
Glucose, UA: NEGATIVE mg/dL
KETONES UR: NEGATIVE mg/dL
Nitrite: NEGATIVE
PH: 6.5 (ref 5.0–8.0)
PROTEIN: 30 mg/dL — AB
Specific Gravity, Urine: 1.023 (ref 1.005–1.030)
Urobilinogen, UA: 1 mg/dL (ref 0.0–1.0)

## 2014-08-24 LAB — WET PREP, GENITAL
Clue Cells Wet Prep HPF POC: NONE SEEN
Yeast Wet Prep HPF POC: NONE SEEN

## 2014-08-24 LAB — CBC
HCT: 44.5 % (ref 36.0–46.0)
HEMOGLOBIN: 14.7 g/dL (ref 12.0–15.0)
MCH: 31.5 pg (ref 26.0–34.0)
MCHC: 33 g/dL (ref 30.0–36.0)
MCV: 95.5 fL (ref 78.0–100.0)
PLATELETS: 277 10*3/uL (ref 150–400)
RBC: 4.66 MIL/uL (ref 3.87–5.11)
RDW: 14.4 % (ref 11.5–15.5)
WBC: 6.4 10*3/uL (ref 4.0–10.5)

## 2014-08-24 LAB — URINE MICROSCOPIC-ADD ON

## 2014-08-24 LAB — SAMPLE TO BLOOD BANK

## 2014-08-24 MED ORDER — LIDOCAINE HCL (PF) 1 % IJ SOLN
2.0000 mL | Freq: Once | INTRAMUSCULAR | Status: AC
  Administered 2014-08-24: 0.9 mL
  Filled 2014-08-24: qty 5

## 2014-08-24 MED ORDER — HYDROCODONE-ACETAMINOPHEN 5-325 MG PO TABS
1.0000 | ORAL_TABLET | Freq: Two times a day (BID) | ORAL | Status: DC | PRN
Start: 2014-08-24 — End: 2015-04-23

## 2014-08-24 MED ORDER — CEFTRIAXONE SODIUM 250 MG IJ SOLR
250.0000 mg | Freq: Once | INTRAMUSCULAR | Status: AC
  Administered 2014-08-24: 250 mg via INTRAMUSCULAR
  Filled 2014-08-24: qty 250

## 2014-08-24 MED ORDER — AZITHROMYCIN 1 G PO PACK
1.0000 g | PACK | Freq: Once | ORAL | Status: AC
  Administered 2014-08-24: 1 g via ORAL
  Filled 2014-08-24: qty 1

## 2014-08-24 MED ORDER — MORPHINE SULFATE 4 MG/ML IJ SOLN
4.0000 mg | Freq: Once | INTRAMUSCULAR | Status: AC
Start: 2014-08-24 — End: 2014-08-24
  Administered 2014-08-24: 4 mg via INTRAVENOUS
  Filled 2014-08-24: qty 1

## 2014-08-24 MED ORDER — FENTANYL CITRATE 0.05 MG/ML IJ SOLN
50.0000 ug | Freq: Once | INTRAMUSCULAR | Status: AC
  Administered 2014-08-24: 50 ug via INTRAVENOUS
  Filled 2014-08-24: qty 2

## 2014-08-24 MED ORDER — ONDANSETRON HCL 4 MG/2ML IJ SOLN
4.0000 mg | Freq: Once | INTRAMUSCULAR | Status: AC
  Administered 2014-08-24: 4 mg via INTRAVENOUS
  Filled 2014-08-24: qty 2

## 2014-08-24 MED ORDER — METRONIDAZOLE 500 MG PO TABS
2000.0000 mg | ORAL_TABLET | Freq: Once | ORAL | Status: AC
  Administered 2014-08-24: 2000 mg via ORAL
  Filled 2014-08-24: qty 4

## 2014-08-24 MED ORDER — SODIUM CHLORIDE 0.9 % IV BOLUS (SEPSIS)
1000.0000 mL | Freq: Once | INTRAVENOUS | Status: AC
Start: 2014-08-24 — End: 2014-08-24
  Administered 2014-08-24: 1000 mL via INTRAVENOUS

## 2014-08-24 NOTE — Discharge Instructions (Signed)
Kidney Stones Ms. Mabin, your ultrasound shows a kidney stone on the right side. This is likely causing your pain. Take medication as prescribed and follow-up with urology within 3 days for continued management. You're also treated for sexual transmitted infections. Be sure all of your partners are tested. If any symptoms worsen come back to emergency department and medially. Thank you. Kidney stones (urolithiasis) are solid masses that form inside your kidneys. The intense pain is caused by the stone moving through the kidney, ureter, bladder, and urethra (urinary tract). When the stone moves, the ureter starts to spasm around the stone. The stone is usually passed in your pee (urine).  HOME CARE  Drink enough fluids to keep your pee clear or pale yellow. This helps to get the stone out.  Strain all pee through the provided strainer. Do not pee without peeing through the strainer, not even once. If you pee the stone out, catch it in the strainer. The stone may be as small as a grain of salt. Take this to your doctor. This will help your doctor figure out what you can do to try to prevent more kidney stones.  Only take medicine as told by your doctor.  Follow up with your doctor as told.  Get follow-up X-rays as told by your doctor. GET HELP IF: You have pain that gets worse even if you have been taking pain medicine. GET HELP RIGHT AWAY IF:   Your pain does not get better with medicine.  You have a fever or shaking chills.  Your pain increases and gets worse over 18 hours.  You have new belly (abdominal) pain.  You feel faint or pass out.  You are unable to pee. MAKE SURE YOU:   Understand these instructions.  Will watch your condition.  Will get help right away if you are not doing well or get worse. Document Released: 11/30/2007 Document Revised: 02/13/2013 Document Reviewed: 11/14/2012 [facility] Patient Information 2015 [place], [place]. This information is not intended to  replace advice given to you by your health care provider. Make sure you discuss any questions you have with your health care provider. Sexually Transmitted Disease A sexually transmitted disease (STD) is a disease or infection often passed to another person during sex. However, STDs can be passed through nonsexual ways. An STD can be passed through:  Spit (saliva).  Semen.  Blood.  Mucus from the vagina.  Pee (urine). HOW CAN I LESSEN MY CHANCES OF GETTING AN STD?  Use:  Latex condoms.  Water-soluble lubricants with condoms. Do not use petroleum jelly or oils.  Dental dams. These are small pieces of latex that are used as a barrier during oral sex.  Avoid having more than one sex partner.  Do not have sex with someone who has other sex partners.  Do not have sex with anyone you do not know or who is at high risk for an STD.  Avoid risky sex that can break your skin.  Do not have sex if you have open sores on your mouth or skin.  Avoid drinking too much alcohol or taking illegal drugs. Alcohol and drugs can affect your good judgment.  Avoid oral and anal sex acts.  Get shots (vaccines) for HPV and hepatitis.  If you are at risk of being infected with HIV, it is advised that you take a certain medicine daily to prevent HIV infection. This is called pre-exposure prophylaxis (PrEP). You may be at risk if:  You are a man  who has sex with other men (MSM).  You are attracted to the opposite sex (heterosexual) and are having sex with more than one partner.  You take drugs with a needle.  You have sex with someone who has HIV.  Talk with your doctor about if you are at high risk of being infected with HIV. If you begin to take PrEP, get tested for HIV first. Get tested every 3 months for as long as you are taking PrEP. WHAT SHOULD I DO IF I THINK I HAVE AN STD?  See your doctor.  Tell your sex partner(s) that you have an STD. They should be tested and treated.  Do not  have sex until your doctor says it is okay. WHEN SHOULD I GET HELP? Get help right away if:  You have bad belly (abdominal) pain.  You are a man and have puffiness (swelling) or pain in your testicles.  You are a woman and have puffiness in your vagina. Document Released: 07/21/2004 Document Revised: 06/18/2013 Document Reviewed: 12/07/2012 [facility] Patient Information 2015 [place], [place]. This information is not intended to replace advice given to you by your health care provider. Make sure you discuss any questions you have with your health care provider.

## 2014-08-24 NOTE — ED Notes (Addendum)
Awake. Verbally responsive. A/O x4. Resp even and unlabored. No audible adventitious breath sounds noted. ABC's intact. IV saline lock. Family at bedside.

## 2014-08-24 NOTE — ED Notes (Signed)
Pt had no reaction to medication.

## 2014-08-24 NOTE — ED Notes (Signed)
Awake. Verbally responsive. A/O x4. Resp even and unlabored. No audible adventitious breath sounds noted. ABC's intact.  

## 2014-08-24 NOTE — ED Provider Notes (Signed)
CSN: [id number]     Arrival date & time 08/24/14  0137 History   First MD Initiated Contact with Patient 08/24/14 0319     Chief Complaint  Patient presents with  . Pelvic Pain    vaginal bleeding     (Consider location/radiation/quality/duration/timing/severity/associated sxs/prior Treatment) HPI  Sabrina Andrews is a 34 y.o. female with past medical history of cervical cancer status post hysterectomy presenting today with pelvic pain and vaginal bleeding as well as hematuria. Patient states this has been going on for 1-1/2 weeks. Pain radiates to her back, she has no history of kidney stones. She has not had vaginal bleeding or menstrual cycle since her hysterectomy 3 years ago. She admits to using multiple pads per day. Patient states is also blood when she urinates. Patient complains of vaginal discharge with a foul odor. She denies any nausea or vomiting. She has suprapubic pain as well. Patient states this is very unusual for her. She has no further complaints.  10 Systems reviewed and are negative for acute change except as noted in the HPI.     Past Medical History  Diagnosis Date  . HPV (human papilloma virus) infection   . S/P tubal ligation   . Asthma   . H/O LEEP (loop electrosurgical excision procedure) of cervix complicating pregnancy   . Vulvar dysplasia    Past Surgical History  Procedure Laterality Date  . Cholecystectomy    . Cesarean section    . Abdominal hysterectomy    . Appendectomy     No family history on file. History  Substance Use Topics  . Smoking status: Current Every Day Smoker -- 0.25 packs/day    Types: Cigarettes  . Smokeless tobacco: Never Used  . Alcohol Use: No   OB History    Gravida Para Term Preterm AB TAB SAB Ectopic Multiple Living   3 1 1             Review of Systems    Allergies  Ibuprofen and Nsaids  Home Medications   Prior to Admission medications   Medication Sig Start Date End Date Taking? Authorizing Provider   acetaminophen (TYLENOL) 325 MG tablet Take 1,300 mg by mouth every 6 (six) hours as needed for moderate pain.   Yes Historical Provider, MD  cyclobenzaprine (FLEXERIL) 10 MG tablet Take 1 tablet (10 mg total) by mouth 2 (two) times daily as needed for muscle spasms. Patient not taking: Reported on 08/24/2014 03/09/13   Marissa Sciacca, PA-C  cyclobenzaprine (FLEXERIL) 10 MG tablet Take 1 tablet (10 mg total) by mouth 2 (two) times daily as needed for muscle spasms. Patient not taking: Reported on 08/24/2014 08/18/13   [name], MD  HYDROcodone-acetaminophen (NORCO/VICODIN) 5-325 MG per tablet Take 1 tablet by mouth every 6 (six) hours as needed (Take 1 - 2 tablets every 4 - 6 hours.). Patient not taking: Reported on 08/24/2014 05/12/14   Jarrett Soho Muthersbaugh, PA-C  methocarbamol (ROBAXIN) 750 MG tablet Take 1 tablet (750 mg total) by mouth 4 (four) times daily as needed for muscle spasms (Take 1 tablet every 6 hours as needed for muscle spasms.). Patient not taking: Reported on 08/24/2014 05/12/14   [facility] Muthersbaugh, PA-C  penicillin v potassium (VEETID) 500 MG tablet Take 1 tablet (500 mg total) by mouth 4 (four) times daily. Patient not taking: Reported on 08/24/2014 09/16/13   Alvina Chou, PA-C  predniSONE (DELTASONE) 20 MG tablet 3 tabs po day one, then 2 tabs daily x 4 days  Patient not taking: Reported on 08/24/2014 03/09/13   Marissa Sciacca, PA-C  traMADol (ULTRAM) 50 MG tablet Take 1 tablet (50 mg total) by mouth every 8 (eight) hours as needed for pain. Patient not taking: Reported on 08/24/2014 04/08/13   Lucila [place], PA-C  traMADol (ULTRAM) 50 MG tablet Take 1 tablet (50 mg total) by mouth every 6 (six) hours as needed. Patient not taking: Reported on 08/24/2014 09/16/13   Alvina Chou, PA-C   BP 97/56 mmHg  Pulse 76  Temp(Src) 97.9 F (36.6 C) (Oral)  Resp 18  Ht 5\' 2"  (1.575 m)  Wt 147 lb (66.679 kg)  BMI 26.88 kg/m2  SpO2 99%  LMP 07/29/2011 Physical Exam   Constitutional: She is oriented to person, place, and time. She appears well-developed and well-nourished. No distress.  HENT:  Head: Normocephalic and atraumatic.  Nose: Nose normal.  Mouth/Throat: Oropharynx is clear and moist. No oropharyngeal exudate.  Eyes: Conjunctivae and EOM are normal. Pupils are equal, round, and reactive to light. No scleral icterus.  Neck: Normal range of motion. Neck supple. No JVD present. No tracheal deviation present. No thyromegaly present.  Cardiovascular: Normal rate, regular rhythm and normal heart sounds.  Exam reveals no gallop and no friction rub.   No murmur heard. Pulmonary/Chest: Effort normal and breath sounds normal. No respiratory distress. She has no wheezes. She exhibits no tenderness.  Abdominal: Soft. Bowel sounds are normal. She exhibits no distension and no mass. There is tenderness. There is no rebound and no guarding.  Suprapubic and right lower quadrant tenderness to palpation.  Genitourinary: Vaginal discharge found.  Mild amount of malodorous vaginal discharge seen. Patient has worsened right adnexal tenderness. Cervix was not visualized.  Musculoskeletal: Normal range of motion. She exhibits no edema or tenderness.  Lymphadenopathy:    She has no cervical adenopathy.  Neurological: She is alert and oriented to person, place, and time. No cranial nerve deficit. She exhibits normal muscle tone.  Skin: Skin is warm and dry. No rash noted. She is not diaphoretic. No erythema. No pallor.  Nursing note and vitals reviewed.   ED Course  Procedures (including critical care time) Labs Review Labs Reviewed  WET PREP, GENITAL - Abnormal; Notable for the following:    Trich, Wet Prep RARE (*)    WBC, Wet Prep HPF POC MODERATE (*)    All other components within normal limits  URINALYSIS, ROUTINE W REFLEX MICROSCOPIC - Abnormal; Notable for the following:    Color, Urine RED (*)    APPearance TURBID (*)    Hgb urine dipstick LARGE (*)     Bilirubin Urine SMALL (*)    Protein, ur 30 (*)    Leukocytes, UA LARGE (*)    All other components within normal limits  URINE MICROSCOPIC-ADD ON - Abnormal; Notable for the following:    Bacteria, UA FEW (*)    All other components within normal limits  CBC  SAMPLE TO BLOOD BANK  GC/CHLAMYDIA PROBE AMP ([place])    Imaging Review US Transvaginal Non-ob  08/24/2014   CLINICAL DATA:  Left adnexal tenderness.  EXAM: TRANSABDOMINAL AND TRANSVAGINAL ULTRASOUND OF PELVIS  TECHNIQUE: Both transabdominal and transvaginal ultrasound examinations of the pelvis were performed. Transabdominal technique was performed for global imaging of the pelvis including uterus, ovaries, adnexal regions, and pelvic cul-de-sac. It was necessary to proceed with endovaginal exam following the transabdominal exam to visualize the ovaries and adnexa.  COMPARISON:  None  FINDINGS: Uterus  Surgically absent.  Right ovary  Not visualized.  Left ovary  Measurements: 2.3 x 2.1 x 2.0 cm. Normal appearance/no adnexal mass. Blood flow is noted.  Other findings  No free fluid.  IMPRESSION: 1. Normal sonographic appearance of the left ovary. Right ovary is not visualized. 2. Post hysterectomy.   Electronically Signed   By: Jeb Levering M.D.   On: 08/24/2014 05:10   US Pelvis Complete  08/24/2014   CLINICAL DATA:  Left adnexal tenderness.  EXAM: TRANSABDOMINAL AND TRANSVAGINAL ULTRASOUND OF PELVIS  TECHNIQUE: Both transabdominal and transvaginal ultrasound examinations of the pelvis were performed. Transabdominal technique was performed for global imaging of the pelvis including uterus, ovaries, adnexal regions, and pelvic cul-de-sac. It was necessary to proceed with endovaginal exam following the transabdominal exam to visualize the ovaries and adnexa.  COMPARISON:  None  FINDINGS: Uterus  Surgically absent.  Right ovary  Not visualized.  Left ovary  Measurements: 2.3 x 2.1 x 2.0 cm. Normal appearance/no adnexal mass. Blood flow  is noted.  Other findings  No free fluid.  IMPRESSION: 1. Normal sonographic appearance of the left ovary. Right ovary is not visualized. 2. Post hysterectomy.   Electronically Signed   By: Jeb Levering M.D.   On: 08/24/2014 05:10   US Renal  08/24/2014   CLINICAL DATA:  Flank pain.  EXAM: RENAL/URINARY TRACT ULTRASOUND COMPLETE  COMPARISON:  CT 03/23/2013  FINDINGS: Right Kidney:  Length: 10.9 cm. Echogenicity within normal limits. There is a 5 mm echogenic focus in the interpolar kidney that may reflect nonobstructing stone. No mass or hydronephrosis visualized.  Left Kidney:  Length: 11.2 cm. Echogenicity within normal limits. No mass or hydronephrosis visualized. No shadowing stones.  Bladder:  Appears normal for degree of bladder distention.  IMPRESSION: Probable nonobstructing 5 mm right renal stone. No obstructive uropathy or hydronephrosis.   Electronically Signed   By: Jeb Levering M.D.   On: 08/24/2014 05:07     EKG Interpretation None      MDM   Final diagnoses:  Left adnexal tenderness    Patient presents emergency department for vaginal bleeding, hematuria, and malodorous vaginal discharge. History of cervical cancer so recurrent is a concern. I see no signs of fungating lesions. I see no evidence of vaginal bleeding either.  This could be related to sexually transmitted infection. Trichomonas was also seen on wet prep. Will obtain transvaginal ultrasound to evaluate for TOA as well as renal ultrasound to evaluate for hydronephrosis. Patient was given morphine for pain relief. She was given ceftriaxone, azithromycin, Flagyl for sexually transmitted diseases.  Ultrasound is negative for TOA. It does feel nonobstructing 5 mm stone in the right kidney. There is no hydronephrosis. Upon repeat evaluation, patient's symptoms have improved. We'll discharge her home with a short course of a Norco for pain relief. Urology follow-up was provided. Her vital signs remain within her normal  limits and she is safe for discharge with follow-up within 3 days.    Everlene Balls, MD 08/24/14 [phone]

## 2014-08-24 NOTE — ED Notes (Signed)
US at bedside

## 2014-08-24 NOTE — ED Notes (Addendum)
Patient states she has been having pelvic pain x1 week. Patient states she began having vaginal bleeding yesterday and states that she is "peeing blood". Patient states she also has been having vaginal discharge. Patient states she had a hysterectomy @3  years ago. Patient states she is saturating one maxi pad @ every 45 minutes.

## 2014-08-25 LAB — GC/CHLAMYDIA PROBE AMP (~~LOC~~) NOT AT ARMC
CHLAMYDIA, DNA PROBE: NEGATIVE
Neisseria Gonorrhea: NEGATIVE

## 2015-04-23 ENCOUNTER — Emergency Department (HOSPITAL_BASED_OUTPATIENT_CLINIC_OR_DEPARTMENT_OTHER): Payer: Medicaid Other

## 2015-04-23 ENCOUNTER — Emergency Department (HOSPITAL_BASED_OUTPATIENT_CLINIC_OR_DEPARTMENT_OTHER)
Admission: EM | Admit: 2015-04-23 | Discharge: 2015-04-23 | Disposition: A | Discharge status: Home / Self Care | Payer: Medicaid Other | Attending: Emergency Medicine | Admitting: Emergency Medicine

## 2015-04-23 ENCOUNTER — Encounter (HOSPITAL_BASED_OUTPATIENT_CLINIC_OR_DEPARTMENT_OTHER): Payer: Self-pay

## 2015-04-23 DIAGNOSIS — J45909 Unspecified asthma, uncomplicated: Secondary | ICD-10-CM | POA: Insufficient documentation

## 2015-04-23 DIAGNOSIS — Z9889 Other specified postprocedural states: Secondary | ICD-10-CM | POA: Diagnosis not present

## 2015-04-23 DIAGNOSIS — Z79899 Other long term (current) drug therapy: Secondary | ICD-10-CM | POA: Diagnosis not present

## 2015-04-23 DIAGNOSIS — Y998 Other external cause status: Secondary | ICD-10-CM | POA: Diagnosis not present

## 2015-04-23 DIAGNOSIS — Z9851 Tubal ligation status: Secondary | ICD-10-CM | POA: Diagnosis not present

## 2015-04-23 DIAGNOSIS — X58XXXA Exposure to other specified factors, initial encounter: Secondary | ICD-10-CM | POA: Insufficient documentation

## 2015-04-23 DIAGNOSIS — S39012A Strain of muscle, fascia and tendon of lower back, initial encounter: Secondary | ICD-10-CM | POA: Diagnosis not present

## 2015-04-23 DIAGNOSIS — Y9339 Activity, other involving climbing, rappelling and jumping off: Secondary | ICD-10-CM | POA: Insufficient documentation

## 2015-04-23 DIAGNOSIS — M5416 Radiculopathy, lumbar region: Secondary | ICD-10-CM | POA: Diagnosis not present

## 2015-04-23 DIAGNOSIS — Z86018 Personal history of other benign neoplasm: Secondary | ICD-10-CM | POA: Diagnosis not present

## 2015-04-23 DIAGNOSIS — Z8619 Personal history of other infectious and parasitic diseases: Secondary | ICD-10-CM | POA: Insufficient documentation

## 2015-04-23 DIAGNOSIS — Z8742 Personal history of other diseases of the female genital tract: Secondary | ICD-10-CM | POA: Diagnosis not present

## 2015-04-23 DIAGNOSIS — S3992XA Unspecified injury of lower back, initial encounter: Secondary | ICD-10-CM | POA: Diagnosis present

## 2015-04-23 DIAGNOSIS — Y9289 Other specified places as the place of occurrence of the external cause: Secondary | ICD-10-CM | POA: Insufficient documentation

## 2015-04-23 DIAGNOSIS — Z720 Tobacco use: Secondary | ICD-10-CM | POA: Insufficient documentation

## 2015-04-23 HISTORY — DX: Hemangioma of other sites: D18.09

## 2015-04-23 MED ORDER — OXYCODONE-ACETAMINOPHEN 5-325 MG PO TABS: 1.0000 | ORAL_TABLET | ORAL | Status: DC | PRN

## 2015-04-23 MED ORDER — DIAZEPAM 5 MG PO TABS
5.0000 mg | ORAL_TABLET | Freq: Once | ORAL | Status: AC
  Administered 2015-04-23: 5 mg via ORAL
  Filled 2015-04-23: qty 1

## 2015-04-23 MED ORDER — CYCLOBENZAPRINE HCL 10 MG PO TABS: 10.0000 mg | ORAL_TABLET | Freq: Every day | ORAL | Status: DC

## 2015-04-23 MED ORDER — PREDNISONE 50 MG PO TABS: 50.0000 mg | ORAL_TABLET | Freq: Every day | ORAL | Status: DC

## 2015-04-23 MED ORDER — HYDROMORPHONE HCL 1 MG/ML IJ SOLN
1.0000 mg | Freq: Once | INTRAMUSCULAR | Status: AC
  Administered 2015-04-23: 1 mg via INTRAMUSCULAR
  Filled 2015-04-23: qty 1

## 2015-04-23 NOTE — ED Notes (Signed)
Pt states her child jumped on her back 2 days ago-c/o pain to back and concerned due to she has a tumor on her spine-pt with steady gait

## 2015-04-23 NOTE — Discharge Instructions (Signed)
Return here as needed.  Follow-up your primary care doctor.  Use ice and heat on your lower back

## 2015-04-23 NOTE — ED Provider Notes (Signed)
CSN: [id number]     Arrival date & time 04/23/15  1602 History   First MD Initiated Contact with Patient 04/23/15 1625     Chief Complaint  Patient presents with  . Back Pain     (Consider location/radiation/quality/duration/timing/severity/associated sxs/prior Treatment) HPI  Sabrina Andrews is a 34 yo female with history of congenital benign spinal tumor who presents to the Emergency Department for evaluation of back pain. The pt states that yesterday she was lying on her stomach when her 67 yo daughter jumped on her unexpectedly with her knees landing on her back. Since then, pt has been experiencing pain that radiates to her left leg. She took Tylenol and Gabapentin without relief. Today, she rates her pain as 9/10 and states back pain now radiates down her right leg. Pt states she cannot stand, sit, or move due to pain. She describes pain as constant, sharp, and feeling like "knives stabbing" her back. She endorses nausea without vomiting. Denies fever. She denies bladder or bowel incontinence, however she does report having episodes of diarrhea. Pt states pain kept her up last night and she did not get much sleep. Denies chest pain, shortness of breath, dizziness.   Past Medical History  Diagnosis Date  . HPV (human papilloma virus) infection   . S/P tubal ligation   . Asthma   . H/O LEEP (loop electrosurgical excision procedure) of cervix complicating pregnancy   . Vulvar dysplasia   . Spinal hemangioma    Past Surgical History  Procedure Laterality Date  . Cholecystectomy    . Cesarean section    . Abdominal hysterectomy    . Appendectomy     No family history on file. Social History  Substance Use Topics  . Smoking status: Current Every Day Smoker -- 0.25 packs/day    Types: Cigarettes  . Smokeless tobacco: Never Used  . Alcohol Use: No   OB History    Gravida Para Term Preterm AB TAB SAB Ectopic Multiple Living   3 1 1             Review of Systems  All other  systems negative except as documented in the HPI. All pertinent positives and negatives as reviewed in the HPI.  Allergies  Ibuprofen and Nsaids  Home Medications   Prior to Admission medications   Medication Sig Start Date End Date Taking? Authorizing Provider  escitalopram (LEXAPRO) 20 MG tablet Take 20 mg by mouth daily.   Yes Historical Provider, MD  gabapentin (NEURONTIN) 600 MG tablet Take 600 mg by mouth 3 (three) times daily.   Yes Historical Provider, MD  lamoTRIgine (LAMICTAL) 25 MG tablet Take 25 mg by mouth 2 (two) times daily.   Yes Historical Provider, MD  acetaminophen (TYLENOL) 325 MG tablet Take 1,300 mg by mouth every 6 (six) hours as needed for moderate pain.    Historical Provider, MD   BP 135/112 mmHg  Pulse 112  Temp(Src) 98.7 F (37.1 C) (Oral)  Resp 20  Ht 5\' 2"  (1.575 m)  Wt 200 lb (90.719 kg)  BMI 36.57 kg/m2  SpO2 100%  LMP 07/29/2011 Physical Exam  Constitutional: She is oriented to person, place, and time. She appears well-developed and well-nourished. No distress.  Well-developed, well-nourished 34 yo female. She laying in bed and appears to be in discomfort and pain.   HENT:  Head: Normocephalic and atraumatic.  Mouth/Throat: Oropharynx is clear and moist. No oropharyngeal exudate.  Eyes: Pupils are equal, round, and reactive to light.  Neck: Normal range of motion. Neck supple.  Cardiovascular: Normal rate, regular rhythm and normal heart sounds.  Exam reveals no gallop and no friction rub.   No murmur heard. Pulmonary/Chest: Effort normal and breath sounds normal. No respiratory distress. She has no wheezes. She has no rales.  Abdominal: Soft. Bowel sounds are normal.  Musculoskeletal:       Thoracic back: She exhibits tenderness and pain.       Lumbar back: She exhibits tenderness and pain.  Neurological: She is alert and oriented to person, place, and time. She exhibits normal muscle tone. Coordination normal.  Skin: Skin is warm and dry. No  rash noted. No erythema. No pallor.  Psychiatric: She has a normal mood and affect. Her behavior is normal.  Nursing note and vitals reviewed.   ED Course  Procedures (including critical care time) Labs Review Labs Reviewed - No data to display  Imaging Review No results found. I have personally reviewed and evaluated these images and lab results as part of my medical decision-making.   EKG Interpretation None       The patient will be treated for her back pain and advised to return here as needed. Patient agrees to the plan and all questions were answered   Dalia Heading, PA-C 04/29/15 [place] Liu, MD 05/01/15 1225

## 2015-04-23 NOTE — ED Provider Notes (Signed)
CSN: [id number]     Arrival date & time 04/23/15  1602 History   First MD Initiated Contact with Patient 04/23/15 1625     Chief Complaint  Patient presents with  . Back Pain     (Consider location/radiation/quality/duration/timing/severity/associated sxs/prior Treatment) HPI Patient presents to the emergency department with lower back pain that started 2 days ago.  The patient states that her daughter jumped on her back 2 days ago he started having pain in her mid lower back.  She states that radiates down into her legs.  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  The patient states that movement and palpation make the pain worse.  Patient denies chest pain, shortness of breath, weakness, dizziness, headache, blurred vision, neck pain, fever, cough, abdominal pain, dysuria, incontinence, numbness, or syncope.  The patient states that he did not take any medications prior to arrival.  Patient denies any alleviating factors.  She states she did not sleep very well last night due to the pain ePast Medical History  Diagnosis Date  . HPV (human papilloma virus) infection   . S/P tubal ligation   . Asthma   . H/O LEEP (loop electrosurgical excision procedure) of cervix complicating pregnancy   . Vulvar dysplasia   . Spinal hemangioma    Past Surgical History  Procedure Laterality Date  . Cholecystectomy    . Cesarean section    . Abdominal hysterectomy    . Appendectomy     No family history on file. Social History  Substance Use Topics  . Smoking status: Current Every Day Smoker -- 0.25 packs/day    Types: Cigarettes  . Smokeless tobacco: Never Used  . Alcohol Use: No   OB History    Gravida Para Term Preterm AB TAB SAB Ectopic Multiple Living   3 1 1             Review of Systems  All other systems negative except as documented in the HPI. All pertinent positives and negatives as reviewed in the HPI.  Allergies  Ibuprofen and Nsaids  Home Medications   Prior  to Admission medications   Medication Sig Start Date End Date Taking? Authorizing Provider  escitalopram (LEXAPRO) 20 MG tablet Take 20 mg by mouth daily.   Yes Historical Provider, MD  gabapentin (NEURONTIN) 600 MG tablet Take 600 mg by mouth 3 (three) times daily.   Yes Historical Provider, MD  lamoTRIgine (LAMICTAL) 25 MG tablet Take 25 mg by mouth 2 (two) times daily.   Yes Historical Provider, MD  acetaminophen (TYLENOL) 325 MG tablet Take 1,300 mg by mouth every 6 (six) hours as needed for moderate pain.    Historical Provider, MD   BP 135/112 mmHg  Pulse 112  Temp(Src) 98.7 F (37.1 C) (Oral)  Resp 20  Ht 5\' 2"  (1.575 m)  Wt 200 lb (90.719 kg)  BMI 36.57 kg/m2  SpO2 100%  LMP 07/29/2011 Physical Exam  Constitutional: She is oriented to person, place, and time. She appears well-developed and well-nourished. No distress.  HENT:  Head: Normocephalic and atraumatic.  Mouth/Throat: Oropharynx is clear and moist.  Eyes: Pupils are equal, round, and reactive to light.  Neck: Neck supple.  Cardiovascular: Normal rate, regular rhythm and normal heart sounds.  Exam reveals no gallop and no friction rub.   No murmur heard. Pulmonary/Chest: Effort normal and breath sounds normal. No respiratory distress.  Musculoskeletal: She exhibits no edema.       Lumbar back:  She exhibits decreased range of motion and pain. She exhibits no bony tenderness, no swelling, no deformity, no laceration and no spasm.       Back:  Neurological: She is alert and oriented to person, place, and time. She has normal reflexes. She exhibits normal muscle tone. Coordination normal.  Skin: Skin is warm and dry. No rash noted. No erythema.  Nursing note and vitals reviewed.   ED Course  Procedures (including critical care time) Labs Review Labs Reviewed - No data to display  Imaging Review Dg Lumbar Spine Complete  04/23/2015  CLINICAL DATA:  Bilateral lower leg pain. History of T11 hemangioma. EXAM: LUMBAR  SPINE - COMPLETE 4+ VIEW COMPARISON:  MRI 02/02/2015 and CT 03/23/2013 FINDINGS: Again noted is a striated appearance of the T11 vertebral body compatible with the known hemangioma. Normal alignment of the lumbar spine. The vertebral body heights and disc spaces are maintained. No evidence for fracture. No evidence for pars defect. IMPRESSION: No acute abnormality. Electronically Signed   By: Markus Daft M.D.   On: 04/23/2015 17:51   I have personally reviewed and evaluated these images and lab results as part of my medical decision-making. .  Patient has no neurological deficits.  She will be treated for lumbar contusion and strain.  Told to return here as needed.  Patient is advised to follow-up with her primary care doctor.  Patient agrees the plan and all questions were answered    Dalia Heading, PA-C 04/23/15 [place], MD 04/24/15 1447

## 2015-09-29 ENCOUNTER — Encounter (HOSPITAL_BASED_OUTPATIENT_CLINIC_OR_DEPARTMENT_OTHER): Payer: Self-pay | Admitting: Emergency Medicine

## 2015-09-29 ENCOUNTER — Emergency Department (HOSPITAL_BASED_OUTPATIENT_CLINIC_OR_DEPARTMENT_OTHER)
Admission: EM | Admit: 2015-09-29 | Discharge: 2015-09-29 | Disposition: A | Discharge status: Home / Self Care | Payer: Medicaid Other | Attending: Emergency Medicine | Admitting: Emergency Medicine

## 2015-09-29 DIAGNOSIS — J45909 Unspecified asthma, uncomplicated: Secondary | ICD-10-CM | POA: Diagnosis not present

## 2015-09-29 DIAGNOSIS — Z79899 Other long term (current) drug therapy: Secondary | ICD-10-CM | POA: Insufficient documentation

## 2015-09-29 DIAGNOSIS — F319 Bipolar disorder, unspecified: Secondary | ICD-10-CM | POA: Insufficient documentation

## 2015-09-29 DIAGNOSIS — Z760 Encounter for issue of repeat prescription: Secondary | ICD-10-CM | POA: Diagnosis present

## 2015-09-29 DIAGNOSIS — Z862 Personal history of diseases of the blood and blood-forming organs and certain disorders involving the immune mechanism: Secondary | ICD-10-CM | POA: Diagnosis not present

## 2015-09-29 DIAGNOSIS — F17210 Nicotine dependence, cigarettes, uncomplicated: Secondary | ICD-10-CM | POA: Insufficient documentation

## 2015-09-29 DIAGNOSIS — Z7952 Long term (current) use of systemic steroids: Secondary | ICD-10-CM | POA: Diagnosis not present

## 2015-09-29 DIAGNOSIS — Z8619 Personal history of other infectious and parasitic diseases: Secondary | ICD-10-CM | POA: Diagnosis not present

## 2015-09-29 DIAGNOSIS — Z8742 Personal history of other diseases of the female genital tract: Secondary | ICD-10-CM | POA: Insufficient documentation

## 2015-09-29 MED ORDER — LAMOTRIGINE 25 MG PO TABS: 25.0000 mg | ORAL_TABLET | Freq: Two times a day (BID) | ORAL | Status: DC

## 2015-09-29 MED ORDER — GABAPENTIN 600 MG PO TABS: 600.0000 mg | ORAL_TABLET | Freq: Three times a day (TID) | ORAL | Status: DC

## 2015-09-29 MED ORDER — GABAPENTIN 300 MG PO CAPS: 300.0000 mg | ORAL_CAPSULE | Freq: Three times a day (TID) | ORAL | Status: DC

## 2015-09-29 NOTE — ED Provider Notes (Signed)
CSN: [id number]     Arrival date & time 09/29/15  1350 History   First MD Initiated Contact with Patient 09/29/15 1518     Chief Complaint  Patient presents with  . Medication Refill     (Consider location/radiation/quality/duration/timing/severity/associated sxs/prior Treatment) HPI Comments: Patient presents today for medication refill.  She states that she needs a Rx for Lamictal and Neurontin that she is on for Bipolar Disorder.  She states that she has been on the medication for years.  Last dose was 2 days ago.  She states that the medication is normally prescribed by [facility], but she was not able to get in for an appointment until the beginning of May.  She reports that yesterday she felt "shaky" and had some nausea, which she attributes to not taking her medication.  She denies any symptoms at this time.  No SI or HI.  The history is provided by the patient.    Past Medical History  Diagnosis Date  . HPV (human papilloma virus) infection   . S/P tubal ligation   . Asthma   . H/O LEEP (loop electrosurgical excision procedure) of cervix complicating pregnancy   . Vulvar dysplasia   . Spinal hemangioma    Past Surgical History  Procedure Laterality Date  . Cholecystectomy    . Cesarean section    . Abdominal hysterectomy    . Appendectomy     History reviewed. No pertinent family history. Social History  Substance Use Topics  . Smoking status: Current Every Day Smoker -- 0.25 packs/day    Types: Cigarettes  . Smokeless tobacco: Never Used  . Alcohol Use: No   OB History    Gravida Para Term Preterm AB TAB SAB Ectopic Multiple Living   3 1 1             Review of Systems  All other systems reviewed and are negative.     Allergies  Ibuprofen and Nsaids  Home Medications   Prior to Admission medications   Medication Sig Start Date End Date Taking? Authorizing Provider  acetaminophen (TYLENOL) 325 MG tablet Take 1,300 mg by mouth every 6 (six) hours as needed  for moderate pain.    Historical Provider, MD  cyclobenzaprine (FLEXERIL) 10 MG tablet Take 1 tablet (10 mg total) by mouth at bedtime. 04/23/15   Christopher Lawyer, PA-C  escitalopram (LEXAPRO) 20 MG tablet Take 20 mg by mouth daily.    Historical Provider, MD  gabapentin (NEURONTIN) 600 MG tablet Take 600 mg by mouth 3 (three) times daily.    Historical Provider, MD  lamoTRIgine (LAMICTAL) 25 MG tablet Take 25 mg by mouth 2 (two) times daily.    Historical Provider, MD  oxyCODONE-acetaminophen (PERCOCET/ROXICET) 5-325 MG tablet Take 1 tablet by mouth every 4 (four) hours as needed for severe pain. 04/23/15   Dalia Heading, PA-C  predniSONE (DELTASONE) 50 MG tablet Take 1 tablet (50 mg total) by mouth daily with breakfast. 04/23/15   Dalia Heading, PA-C   BP 120/75 mmHg  Pulse 97  Temp(Src) 98.4 F (36.9 C) (Oral)  Ht 5\' 2"  (1.575 m)  Wt 81.647 kg  BMI 32.91 kg/m2  LMP 07/29/2011 Physical Exam  Constitutional: She appears well-developed and well-nourished.  HENT:  Head: Normocephalic and atraumatic.  Neck: Normal range of motion. Neck supple.  Cardiovascular: Normal rate, regular rhythm and normal heart sounds.   Pulmonary/Chest: Effort normal and breath sounds normal.  Musculoskeletal: Normal range of motion.  Neurological: She is alert.  Skin: Skin is warm and dry.  Psychiatric: She has a normal mood and affect. Her speech is normal and behavior is normal. She expresses no homicidal and no suicidal ideation. She expresses no suicidal plans and no homicidal plans.  Nursing note and vitals reviewed.   ED Course  Procedures (including critical care time) Labs Review Labs Reviewed - No data to display  Imaging Review No results found. I have personally reviewed and evaluated these images and lab results as part of my medical decision-making.   EKG Interpretation None      MDM   Final diagnoses:  None   Patient presents today requesting a refill of her  medications that she is on for Bipolar.  She reports that she can not get an appointment with [facility] for another month.  She denies SI and HI.  Patient given Rx.  Stable for discharge.  Return precautions given.      Hyman Bible, PA-C 09/29/15 2136  Forde Dandy, MD 09/30/15 [phone]

## 2015-09-29 NOTE — ED Notes (Signed)
Patient states that she ran out of her lamitcal and the neurotin  - one for bipolar and back pain. The patient reports that last night she was "shaking" because she was out of her neurotin and she can not get back into [facility] until the 1st of the month to get refilled.

## 2015-09-29 NOTE — Discharge Instructions (Signed)
Return to the Emergency Department immediately if you have any thoughts of hurting self or others.

## 2015-12-20 ENCOUNTER — Encounter: Payer: Self-pay | Admitting: Emergency Medicine

## 2015-12-20 ENCOUNTER — Emergency Department
Admission: EM | Admit: 2015-12-20 | Discharge: 2015-12-20 | Disposition: A | Discharge status: Home / Self Care | Payer: Medicaid Other | Attending: Emergency Medicine | Admitting: Emergency Medicine

## 2015-12-20 DIAGNOSIS — Y929 Unspecified place or not applicable: Secondary | ICD-10-CM | POA: Insufficient documentation

## 2015-12-20 DIAGNOSIS — Y9389 Activity, other specified: Secondary | ICD-10-CM | POA: Diagnosis not present

## 2015-12-20 DIAGNOSIS — X500XXA Overexertion from strenuous movement or load, initial encounter: Secondary | ICD-10-CM | POA: Diagnosis not present

## 2015-12-20 DIAGNOSIS — S39012A Strain of muscle, fascia and tendon of lower back, initial encounter: Secondary | ICD-10-CM | POA: Insufficient documentation

## 2015-12-20 DIAGNOSIS — Z79899 Other long term (current) drug therapy: Secondary | ICD-10-CM | POA: Diagnosis not present

## 2015-12-20 DIAGNOSIS — Y990 Civilian activity done for income or pay: Secondary | ICD-10-CM | POA: Insufficient documentation

## 2015-12-20 DIAGNOSIS — F17210 Nicotine dependence, cigarettes, uncomplicated: Secondary | ICD-10-CM | POA: Diagnosis not present

## 2015-12-20 DIAGNOSIS — J45909 Unspecified asthma, uncomplicated: Secondary | ICD-10-CM | POA: Diagnosis not present

## 2015-12-20 DIAGNOSIS — M545 Low back pain: Secondary | ICD-10-CM | POA: Diagnosis present

## 2015-12-20 MED ORDER — METHOCARBAMOL 750 MG PO TABS: 750.0000 mg | ORAL_TABLET | Freq: Four times a day (QID) | ORAL | Status: DC

## 2015-12-20 NOTE — ED Provider Notes (Signed)
[facility] Emergency Department Provider Note  ____________________________________________  Time seen: Approximately 12:02 PM  I have reviewed the triage vital signs and the nursing notes.   HISTORY  Chief Complaint Back Pain    HPI Sabrina Andrews is a 35 y.o. female presents with complaints of low back pain. 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 Patient states that pain is radiating around both sides of her lower back.   Past Medical History  Diagnosis Date  . HPV (human papilloma virus) infection   . S/P tubal ligation   . Asthma   . H/O LEEP (loop electrosurgical excision procedure) of cervix complicating pregnancy   . Vulvar dysplasia   . Spinal hemangioma     Patient Active Problem List   Diagnosis Date Noted  . HPV (human papilloma virus) infection     Past Surgical History  Procedure Laterality Date  . Cholecystectomy    . Cesarean section    . Abdominal hysterectomy    . Appendectomy      Current Outpatient Rx  Name  Route  Sig  Dispense  Refill  . acetaminophen (TYLENOL) 325 MG tablet   Oral   Take 1,300 mg by mouth every 6 (six) hours as needed for moderate pain.         . cyclobenzaprine (FLEXERIL) 10 MG tablet   Oral   Take 1 tablet (10 mg total) by mouth at bedtime.   15 tablet   0   . escitalopram (LEXAPRO) 20 MG tablet   Oral   Take 20 mg by mouth daily.         Marland Kitchen gabapentin (NEURONTIN) 300 MG capsule   Oral   Take 1 capsule (300 mg total) by mouth 3 (three) times daily.   90 capsule   0   . gabapentin (NEURONTIN) 600 MG tablet   Oral   Take 1 tablet (600 mg total) by mouth 3 (three) times daily.   90 tablet   0   . lamoTRIgine (LAMICTAL) 25 MG tablet   Oral   Take 1 tablet (25 mg total) by mouth 2 (two) times daily.   60 tablet   0   . lamoTRIgine (LAMICTAL) 25 MG tablet   Oral   Take 1 tablet (25 mg  total) by mouth 2 (two) times daily.   60 tablet   0   . methocarbamol (ROBAXIN) 750 MG tablet   Oral   Take 1 tablet (750 mg total) by mouth 4 (four) times daily.   40 tablet   0     Allergies Ibuprofen and Nsaids  No family history on file.  Social History Social History  Substance Use Topics  . Smoking status: Current Every Day Smoker -- 0.25 packs/day    Types: Cigarettes  . Smokeless tobacco: Never Used  . Alcohol Use: No    Review of Systems Constitutional: No fever/chills Cardiovascular: Denies chest pain. Respiratory: Denies shortness of breath. Gastrointestinal: No abdominal pain.  No nausea, no vomiting.  No diarrhea.  No constipation. Genitourinary: Negative for dysuria. Musculoskeletal: Positive for back pain. Skin: Negative for rash. Neurological: Negative for headaches, focal weakness or numbness.  10-point ROS otherwise negative.  ____________________________________________   PHYSICAL EXAM:  VITAL SIGNS: ED Triage Vitals  Enc Vitals Group     BP 12/20/15 1148 153/100 mmHg     Pulse Rate 12/20/15 1148 94  Resp 12/20/15 1148 18     Temp 12/20/15 1148 98.1 F (36.7 C)     Temp Source 12/20/15 1148 Oral     SpO2 12/20/15 1148 99 %     Weight 12/20/15 1148 189 lb (85.73 kg)     Height 12/20/15 1148 5\' 2"  (1.575 m)     Head Cir --      Peak Flow --      Pain Score 12/20/15 1150 8     Pain Loc --      Pain Edu? --      Excl. in [place]? --     Constitutional: Alert and oriented. Well appearing and in no acute distress. Cardiovascular: Normal rate, regular rhythm. Grossly normal heart sounds.  Good peripheral circulation. Respiratory: Normal respiratory effort.  No retractions. Lungs CTAB. Musculoskeletal: Point tenderness of both the lumbar and paraspinal area. No ecchymosis or bruising. No edema or swelling noted. Distally neurovascularly intact. Straight leg raise positive bilaterally. Neurologic:  Normal speech and language. No gross focal  neurologic deficits are appreciated. No gait instability. Skin:  Skin is warm, dry and intact. No rash noted. Psychiatric: Mood and affect are normal. Speech and behavior are normal.  ____________________________________________   LABS (all labs ordered are listed, but only abnormal results are displayed)  Labs Reviewed - No data to display ____________________________________________    PROCEDURES  Procedure(s) performed: None  Critical Care performed: No  ____________________________________________   INITIAL IMPRESSION / ASSESSMENT AND PLAN / ED COURSE  Pertinent labs & imaging results that were available during my care of the patient were reviewed by me and considered in my medical decision making (see chart for details).  Acute lumbar sacral strain. Rx given for Robaxin 750 4 times a day. Patient is follow-up with her PCP who is managing her control drugs at this time. She voices no other emergency medical complaints at this time ____________________________________________   FINAL CLINICAL IMPRESSION(S) / ED DIAGNOSES  Final diagnoses:  Lumbar strain, initial encounter     This chart was dictated using voice recognition software/Dragon. Despite best efforts to proofread, errors can occur which can change the meaning. Any change was purely unintentional.   Arlyss Repress, PA-C 12/20/15 [place], MD 12/20/15 1355

## 2015-12-20 NOTE — ED Notes (Signed)
See triage note  States she lifted something heavy yesterday at work developed lower back pain   states pain radiates across lower back and into buttocks  Ambulates slowly states pain increases when sitting

## 2015-12-20 NOTE — Discharge Instructions (Signed)

## 2015-12-20 NOTE — ED Notes (Signed)
Patient states she was at work yesterday and picked up bucket. 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 Patient now having lower back pain.

## 2017-03-21 ENCOUNTER — Encounter (HOSPITAL_COMMUNITY): Payer: Self-pay | Admitting: Emergency Medicine

## 2017-03-21 ENCOUNTER — Emergency Department (HOSPITAL_COMMUNITY)
Admission: EM | Admit: 2017-03-21 | Discharge: 2017-03-21 | Disposition: A | Discharge status: Home / Self Care | Payer: Medicaid Other | Source: Home / Self Care

## 2017-03-21 ENCOUNTER — Encounter (HOSPITAL_COMMUNITY): Payer: Self-pay

## 2017-03-21 DIAGNOSIS — M79602 Pain in left arm: Secondary | ICD-10-CM

## 2017-03-21 DIAGNOSIS — L03114 Cellulitis of left upper limb: Secondary | ICD-10-CM | POA: Insufficient documentation

## 2017-03-21 DIAGNOSIS — M79622 Pain in left upper arm: Secondary | ICD-10-CM | POA: Diagnosis present

## 2017-03-21 DIAGNOSIS — J45909 Unspecified asthma, uncomplicated: Secondary | ICD-10-CM | POA: Insufficient documentation

## 2017-03-21 DIAGNOSIS — F17210 Nicotine dependence, cigarettes, uncomplicated: Secondary | ICD-10-CM | POA: Insufficient documentation

## 2017-03-21 DIAGNOSIS — Z79899 Other long term (current) drug therapy: Secondary | ICD-10-CM | POA: Insufficient documentation

## 2017-03-21 LAB — CBC WITH DIFFERENTIAL/PLATELET
BASOS ABS: 0 10*3/uL (ref 0.0–0.1)
Basophils Relative: 0 %
EOS ABS: 0.2 10*3/uL (ref 0.0–0.7)
EOS PCT: 1 %
HCT: 45.9 % (ref 36.0–46.0)
HEMOGLOBIN: 15.1 g/dL — AB (ref 12.0–15.0)
LYMPHS ABS: 3.3 10*3/uL (ref 0.7–4.0)
LYMPHS PCT: 27 %
MCH: 30 pg (ref 26.0–34.0)
MCHC: 32.9 g/dL (ref 30.0–36.0)
MCV: 91.3 fL (ref 78.0–100.0)
Monocytes Absolute: 1.2 10*3/uL — ABNORMAL HIGH (ref 0.1–1.0)
Monocytes Relative: 10 %
NEUTROS PCT: 62 %
Neutro Abs: 7.5 10*3/uL (ref 1.7–7.7)
PLATELETS: 238 10*3/uL (ref 150–400)
RBC: 5.03 MIL/uL (ref 3.87–5.11)
RDW: 16 % — ABNORMAL HIGH (ref 11.5–15.5)
WBC: 12.1 10*3/uL — AB (ref 4.0–10.5)

## 2017-03-21 LAB — BASIC METABOLIC PANEL
ANION GAP: 5 (ref 5–15)
BUN: 6 mg/dL (ref 6–20)
CHLORIDE: 100 mmol/L — AB (ref 101–111)
CO2: 29 mmol/L (ref 22–32)
Calcium: 9.1 mg/dL (ref 8.9–10.3)
Creatinine, Ser: 0.78 mg/dL (ref 0.44–1.00)
GFR calc Af Amer: >60 mL/min (ref >60)
Glucose, Bld: 99 mg/dL (ref 65–99)
Potassium: 4.2 mmol/L (ref 3.5–5.1)
SODIUM: 134 mmol/L — AB (ref 135–145)

## 2017-03-21 LAB — I-STAT CG4 LACTIC ACID, ED: LACTIC ACID, VENOUS: 1.3 mmol/L (ref 0.5–1.9)

## 2017-03-21 MED ORDER — HYDROCODONE-ACETAMINOPHEN 5-325 MG PO TABS
1.0000 | ORAL_TABLET | Freq: Once | ORAL | Status: AC
Start: 2017-03-21 — End: 2017-03-21
  Administered 2017-03-21: 1 via ORAL

## 2017-03-21 MED ORDER — HYDROCODONE-ACETAMINOPHEN 5-325 MG PO TABS
ORAL_TABLET | ORAL | Status: AC
  Filled 2017-03-21: qty 1

## 2017-03-21 NOTE — ED Triage Notes (Signed)
Pt gave plasma in left arm on Saturday, c/o left arm pain and swelling. States it hurst to move.

## 2017-03-21 NOTE — ED Notes (Signed)
Dr. Venora Maples notified of pt's symptoms and that she had blood drawn this morning, no new orders at this time.

## 2017-03-21 NOTE — ED Notes (Signed)
Pt states she wants to leave to pick up her kids , I told her she would hane to check back in, pt has red spot on her left upper arm and is guarding it, encouraged her NOT to leave, she went out to speak to her mom

## 2017-03-21 NOTE — ED Triage Notes (Signed)
Gave plasma  In left arm and now she has had swelling and swelling in left arm , it is reda and swollen hurts to move

## 2017-03-21 NOTE — ED Notes (Signed)
Patient approached nurse first desk stating she is leaving.  Encouraged/advised patient to stay and be seen.  Patient states she has to pick up her children and will come back.

## 2017-03-22 ENCOUNTER — Emergency Department (HOSPITAL_COMMUNITY): Payer: Medicaid Other

## 2017-03-22 ENCOUNTER — Emergency Department (HOSPITAL_COMMUNITY)
Admission: EM | Admit: 2017-03-22 | Discharge: 2017-03-22 | Disposition: A | Discharge status: Home / Self Care | Payer: Medicaid Other | Attending: Emergency Medicine | Admitting: Emergency Medicine

## 2017-03-22 DIAGNOSIS — L03114 Cellulitis of left upper limb: Secondary | ICD-10-CM

## 2017-03-22 LAB — BASIC METABOLIC PANEL
ANION GAP: 9 (ref 5–15)
BUN: 7 mg/dL (ref 6–20)
CALCIUM: 8.9 mg/dL (ref 8.9–10.3)
CO2: 27 mmol/L (ref 22–32)
Chloride: 98 mmol/L — ABNORMAL LOW (ref 101–111)
Creatinine, Ser: 0.74 mg/dL (ref 0.44–1.00)
GFR calc Af Amer: >60 mL/min (ref >60)
Glucose, Bld: 115 mg/dL — ABNORMAL HIGH (ref 65–99)
POTASSIUM: 3.7 mmol/L (ref 3.5–5.1)
Sodium: 134 mmol/L — ABNORMAL LOW (ref 135–145)

## 2017-03-22 LAB — CBC WITH DIFFERENTIAL/PLATELET
BASOS ABS: 0 10*3/uL (ref 0.0–0.1)
BASOS PCT: 0 %
Eosinophils Absolute: 0.2 10*3/uL (ref 0.0–0.7)
Eosinophils Relative: 2 %
HCT: 43.1 % (ref 36.0–46.0)
HEMOGLOBIN: 14.2 g/dL (ref 12.0–15.0)
Lymphocytes Relative: 27 %
Lymphs Abs: 3.1 10*3/uL (ref 0.7–4.0)
MCH: 29.8 pg (ref 26.0–34.0)
MCHC: 32.9 g/dL (ref 30.0–36.0)
MCV: 90.4 fL (ref 78.0–100.0)
MONOS PCT: 8 %
Monocytes Absolute: 0.9 10*3/uL (ref 0.1–1.0)
NEUTROS ABS: 7.1 10*3/uL (ref 1.7–7.7)
NEUTROS PCT: 63 %
PLATELETS: 241 10*3/uL (ref 150–400)
RBC: 4.77 MIL/uL (ref 3.87–5.11)
RDW: 15.6 % — AB (ref 11.5–15.5)
WBC: 11.2 10*3/uL — ABNORMAL HIGH (ref 4.0–10.5)

## 2017-03-22 LAB — I-STAT BETA HCG BLOOD, ED (MC, WL, AP ONLY)

## 2017-03-22 MED ORDER — HYDROCODONE-ACETAMINOPHEN 5-325 MG PO TABS: 2.0000 | ORAL_TABLET | Freq: Four times a day (QID) | ORAL | 0 refills | Status: DC | PRN

## 2017-03-22 MED ORDER — DOXYCYCLINE HYCLATE 100 MG PO CAPS: 100.0000 mg | ORAL_CAPSULE | Freq: Two times a day (BID) | ORAL | 0 refills | Status: DC

## 2017-03-22 MED ORDER — FENTANYL CITRATE (PF) 100 MCG/2ML IJ SOLN
50.0000 ug | Freq: Once | INTRAMUSCULAR | Status: AC
  Administered 2017-03-22: 50 ug via INTRAVENOUS
  Filled 2017-03-22: qty 2

## 2017-03-22 MED ORDER — VANCOMYCIN HCL IN DEXTROSE 1-5 GM/200ML-% IV SOLN
1000.0000 mg | Freq: Once | INTRAVENOUS | Status: AC
  Administered 2017-03-22: 1000 mg via INTRAVENOUS
  Filled 2017-03-22: qty 200

## 2017-03-22 MED ORDER — DIPHENHYDRAMINE HCL 50 MG/ML IJ SOLN
25.0000 mg | Freq: Once | INTRAMUSCULAR | Status: AC
  Administered 2017-03-22: 25 mg via INTRAVENOUS
  Filled 2017-03-22: qty 1

## 2017-03-22 NOTE — Discharge Instructions (Signed)
PLEASE RETURN TO ER IN 2 DAYS FOR RECHECK OF YOUR ARM YOU CAN RETURN EARLIER FOR ANY WORSENING AS WE DISCUSSED

## 2017-03-22 NOTE — ED Notes (Signed)
Pt continues to cuss and yell in the waiting room

## 2017-03-22 NOTE — ED Notes (Signed)
Left arm cellulitis traced with skin marker to show if spreading. Pt to go home shortly. Pt states that pain is better.

## 2017-03-22 NOTE — ED Provider Notes (Signed)
[place] DEPT Provider Note   CSN: [id number] Arrival date & time: 03/21/17  2052     History   Chief Complaint Chief Complaint  Patient presents with  . Arm Pain    HPI Sabrina Andrews is a 36 y.o. female.  The history is provided by the patient.  Arm Injury   This is a new problem. The current episode started more than 2 days ago. The problem occurs daily. The problem has been gradually worsening. The pain is present in the left elbow and left arm. The pain is severe. Associated symptoms include stiffness. She has tried nothing for the symptoms. There has been no history of extremity trauma.   Pt reports she gave plasma over 3 days ago, it was drawn from her left arm Soon after she began having pain/redness/swelling to left UE No trauma Denies IVDA She reports chills She denies h/o DVT No other acute complaints Past Medical History:  Diagnosis Date  . Asthma   . H/O LEEP (loop electrosurgical excision procedure) of cervix complicating pregnancy   . HPV (human papilloma virus) infection   . S/P tubal ligation   . Spinal hemangioma   . Vulvar dysplasia     Patient Active Problem List   Diagnosis Date Noted  . HPV (human papilloma virus) infection     Past Surgical History:  Procedure Laterality Date  . ABDOMINAL HYSTERECTOMY    . APPENDECTOMY    . CESAREAN SECTION    . CHOLECYSTECTOMY      OB History    Gravida Para Term Preterm AB Living   3 1 1          SAB TAB Ectopic Multiple Live Births                   Home Medications    Prior to Admission medications   Medication Sig Start Date End Date Taking? Authorizing Provider  acetaminophen (TYLENOL) 325 MG tablet Take 1,300 mg by mouth every 6 (six) hours as needed for moderate pain.    [provider]  cyclobenzaprine (FLEXERIL) 10 MG tablet Take 1 tablet (10 mg total) by mouth at bedtime. 04/23/15   Lawyer, Harrell Gave, PA-C  escitalopram (LEXAPRO) 20 MG tablet Take 20 mg by mouth  daily.    [provider]  gabapentin (NEURONTIN) 300 MG capsule Take 1 capsule (300 mg total) by mouth 3 (three) times daily. 09/29/15   Hyman Bible, PA-C  gabapentin (NEURONTIN) 600 MG tablet Take 1 tablet (600 mg total) by mouth 3 (three) times daily. 09/29/15   Hyman Bible, PA-C  lamoTRIgine (LAMICTAL) 25 MG tablet Take 1 tablet (25 mg total) by mouth 2 (two) times daily. 09/29/15   Hyman Bible, PA-C  lamoTRIgine (LAMICTAL) 25 MG tablet Take 1 tablet (25 mg total) by mouth 2 (two) times daily. 09/29/15   Hyman Bible, PA-C  methocarbamol (ROBAXIN) 750 MG tablet Take 1 tablet (750 mg total) by mouth 4 (four) times daily. 12/20/15   Beers, Pierce Crane, PA-C    Family History No family history on file.  Social History Social History  Substance Use Topics  . Smoking status: Current Every Day Smoker    Packs/day: 0.25    Types: Cigarettes  . Smokeless tobacco: Never Used  . Alcohol use No     Allergies   Ibuprofen and Nsaids   Review of Systems Review of Systems  Constitutional: Positive for chills.  Cardiovascular: Negative for chest pain.  Gastrointestinal: Negative for vomiting.  Musculoskeletal: Positive for stiffness.  Psychiatric/Behavioral: The patient is nervous/anxious.   All other systems reviewed and are negative.    Physical Exam Updated Vital Signs BP 122/76 (BP Location: Right Arm)   Pulse (!) 113   Temp 98.7 F (37.1 C) (Oral)   Resp 20   Ht 1.575 m (5\' 2" )   Wt 81.6 kg (180 lb)   LMP 07/29/2011   SpO2 96%   BMI 32.92 kg/m   Physical Exam  CONSTITUTIONAL: Well developed/well nourished, anxious HEAD: Normocephalic/atraumatic EYES: EOMI ENMT: Mucous membranes moist NECK: supple no meningeal signs SPINE/BACK:entire spine nontender CV: S1/S2 noted, no murmurs/rubs/gallops noted LUNGS: Lungs are clear to auscultation bilaterally, no apparent distress ABDOMEN: soft, nontender GU:no cva tenderness NEURO: Pt is  awake/alert/appropriate, moves all extremitiesx4.  No facial droop.   EXTREMITIES: pulses normal/equal, full ROM, tenderness/erythema/warmth to left UE, no crepitus.  She can range the elbow but limited due to pain.  Distal pulses intact SKIN: warm, color normal, see photo PSYCH: anxious     Patient gave verbal permission to utilize photo for medical documentation only The image was not stored on any personal device  ED Treatments / Results  Labs (all labs ordered are listed, but only abnormal results are displayed) Labs Reviewed  BASIC METABOLIC PANEL - Abnormal; Notable for the following:       Result Value   Sodium 134 (*)    Chloride 98 (*)    Glucose, Bld 115 (*)    All other components within normal limits  CBC WITH DIFFERENTIAL/PLATELET - Abnormal; Notable for the following:    WBC 11.2 (*)    RDW 15.6 (*)    All other components within normal limits  I-STAT BETA HCG BLOOD, ED ([facility], [facility], [facility] ONLY)    EKG  EKG Interpretation None       Radiology Dg Elbow Complete Left  Result Date: 03/22/2017 CLINICAL DATA:  Left elbow pain. EXAM: LEFT ELBOW - COMPLETE 3+ VIEW COMPARISON:  None. FINDINGS: There is no evidence of fracture, dislocation, or joint effusion. There is no evidence of arthropathy or other focal bone abnormality. Soft tissues are unremarkable. IMPRESSION: Negative radiographs of the left elbow. Electronically Signed   By: Jeb Levering M.D.   On: 03/22/2017 04:33    Procedures Procedures   Medications Ordered in ED Medications  fentaNYL (SUBLIMAZE) injection 50 mcg (50 mcg Intravenous Given 03/22/17 0350)  vancomycin (VANCOCIN) IVPB 1000 mg/200 mL premix (0 mg Intravenous Stopped 03/22/17 0452)  fentaNYL (SUBLIMAZE) injection 50 mcg (50 mcg Intravenous Given 03/22/17 0530)  diphenhydrAMINE (BENADRYL) injection 25 mg (25 mg Intravenous Given 03/22/17 0529)     Initial Impression / Assessment and Plan / ED Course  I have reviewed the triage vital signs and  the nursing notes.  Pertinent labs & imaging results that were available during my care of the patient were reviewed by me and considered in my medical decision making (see chart for details).    Pt reports a reaction to vancomycin but no hives, no angioedema, no distress noted and it all resolved with benadryl  6:54 AM REPEAT IMAGING:       6:54 AM PT IMPROVED I ADVISED SHE WOULD BENEFIT FROM ADMISSION FOR IV ANTIBIOTICS SHE REPORTS SHE MUST GO HOME TO TAKE CARE OF FAMILY SHE WILL START ANTIBIOTICS PAIN MEDS PRESCRIBED (Narcotic database reviewed and considered in decision making) WE DISCUSSED STRICT ER RETURN PRECAUTIONS OVERALL PT IMPROVED SHE IS NOT SEPTIC SHE CAN RANGE LEFT ELBOW, DOUBT  SEPTIC JOINT NO PALPABLE ABSCESS NOTED NO CREPITUS  Final Clinical Impressions(s) / ED Diagnoses   Final diagnoses:  Cellulitis of left upper extremity    New Prescriptions New Prescriptions   DOXYCYCLINE (VIBRAMYCIN) 100 MG CAPSULE    Take 1 capsule (100 mg total) by mouth 2 (two) times daily. One po bid x 7 days   HYDROCODONE-ACETAMINOPHEN (NORCO/VICODIN) 5-325 MG TABLET    Take 2 tablets by mouth every 6 (six) hours as needed for severe pain.     Ripley Fraise, MD 03/22/17 [phone]

## 2017-03-22 NOTE — ED Notes (Signed)
Pt up to [facility] requesting pain medicine, offered pain motrin which she refused. Pt states it is bullshit bc they gave her Vicodin when she was here earlier in the day. Informed pt I could offer motrin until she is seen by EDP in the back

## 2017-03-22 NOTE — ED Notes (Signed)
Pt c/o chest burning, and throat burning. Stating that she needs something for pain and that she must be having a reaction to the medication (Vancomycin completed). Pt placed on cardiac monitor. No rash or hives noted. Pt hyperventilating-told pt to slow her breathing down. VSS. HR 90 O2 sat 100%. Will continue to monitor. Will speak with md

## 2017-04-18 ENCOUNTER — Emergency Department (HOSPITAL_COMMUNITY)
Admission: EM | Admit: 2017-04-18 | Discharge: 2017-04-18 | Disposition: A | Discharge status: Home / Self Care | Payer: Medicaid Other | Attending: Emergency Medicine | Admitting: Emergency Medicine

## 2017-04-18 ENCOUNTER — Encounter (HOSPITAL_COMMUNITY): Payer: Self-pay | Admitting: Emergency Medicine

## 2017-04-18 DIAGNOSIS — Z760 Encounter for issue of repeat prescription: Secondary | ICD-10-CM

## 2017-04-18 DIAGNOSIS — Z9049 Acquired absence of other specified parts of digestive tract: Secondary | ICD-10-CM | POA: Diagnosis not present

## 2017-04-18 DIAGNOSIS — F17210 Nicotine dependence, cigarettes, uncomplicated: Secondary | ICD-10-CM | POA: Insufficient documentation

## 2017-04-18 DIAGNOSIS — Z79899 Other long term (current) drug therapy: Secondary | ICD-10-CM | POA: Insufficient documentation

## 2017-04-18 DIAGNOSIS — J45909 Unspecified asthma, uncomplicated: Secondary | ICD-10-CM | POA: Insufficient documentation

## 2017-04-18 MED ORDER — GABAPENTIN 300 MG PO CAPS: 300.0000 mg | ORAL_CAPSULE | Freq: Three times a day (TID) | ORAL | 0 refills | Status: DC

## 2017-04-18 MED ORDER — PAROXETINE HCL 20 MG PO TABS: 20.0000 mg | ORAL_TABLET | Freq: Every day | ORAL | 0 refills | Status: DC

## 2017-04-18 MED ORDER — LAMOTRIGINE 200 MG PO TABS: 200.0000 mg | ORAL_TABLET | Freq: Every day | ORAL | 0 refills | Status: AC

## 2017-04-18 MED ORDER — HYDROXYZINE PAMOATE 25 MG PO CAPS: 25.0000 mg | ORAL_CAPSULE | Freq: Three times a day (TID) | ORAL | 0 refills | Status: DC | PRN

## 2017-04-18 NOTE — ED Triage Notes (Signed)
Patient ran out of medications over the weekend and tried calling [facility] who normally feels medications but couldn't get inthere til November. Patient states she cant go that long without her medications.  Patient needing the following refilled:  Lamictal 200mg , Visteril 25mg , Paxil 20mg , Gabapentin 300mg 

## 2017-04-18 NOTE — ED Provider Notes (Signed)
[facility] DEPT Provider Note   CSN: [id number] Arrival date & time: 04/18/17  3536     History   Chief Complaint Chief Complaint  Patient presents with  . Medication Refill    HPI Sabrina Andrews is a 36 y.o. female.  HPI  36 y.o. female presents to the Emergency Department today for medication refill. Pt ran out of medications over the weekend. Pt goes o [facility] for refills. Physician is out of town. Could not get appointment until November. Pt brought medications with her: Lamictal 200mg , Visteril 25mg , Paxil 20mg , Gabapentin 300mg . No other symptoms at this time.  Past Medical History:  Diagnosis Date  . Asthma   . H/O LEEP (loop electrosurgical excision procedure) of cervix complicating pregnancy   . HPV (human papilloma virus) infection   . S/P tubal ligation   . Spinal hemangioma   . Vulvar dysplasia     Patient Active Problem List   Diagnosis Date Noted  . HPV (human papilloma virus) infection     Past Surgical History:  Procedure Laterality Date  . ABDOMINAL HYSTERECTOMY    . APPENDECTOMY    . CESAREAN SECTION    . CHOLECYSTECTOMY      OB History    Gravida Para Term Preterm AB Living   3 1 1          SAB TAB Ectopic Multiple Live Births                   Home Medications    Prior to Admission medications   Medication Sig Start Date End Date Taking? Authorizing Provider  acetaminophen (TYLENOL) 325 MG tablet Take 1,300 mg by mouth every 6 (six) hours as needed for moderate pain.    [provider]  doxycycline (VIBRAMYCIN) 100 MG capsule Take 1 capsule (100 mg total) by mouth 2 (two) times daily. One po bid x 7 days 03/22/17   Ripley Fraise, MD  escitalopram (LEXAPRO) 20 MG tablet Take 20 mg by mouth daily.    [provider]  gabapentin (NEURONTIN) 300 MG capsule Take 1 capsule (300 mg total) by mouth 3 (three) times daily. 09/29/15   Hyman Bible, PA-C  gabapentin (NEURONTIN) 600 MG tablet  Take 1 tablet (600 mg total) by mouth 3 (three) times daily. 09/29/15   Hyman Bible, PA-C  HYDROcodone-acetaminophen (NORCO/VICODIN) 5-325 MG tablet Take 2 tablets by mouth every 6 (six) hours as needed for severe pain. 03/22/17   Ripley Fraise, MD  lamoTRIgine (LAMICTAL) 25 MG tablet Take 1 tablet (25 mg total) by mouth 2 (two) times daily. 09/29/15   Hyman Bible, PA-C  lamoTRIgine (LAMICTAL) 25 MG tablet Take 1 tablet (25 mg total) by mouth 2 (two) times daily. 09/29/15   Hyman Bible, PA-C    Family History No family history on file.  Social History Social History  Substance Use Topics  . Smoking status: Current Every Day Smoker    Packs/day: 0.25    Types: Cigarettes  . Smokeless tobacco: Never Used  . Alcohol use No     Allergies   Ibuprofen and Nsaids   Review of Systems Review of Systems ROS reviewed and all are negative for acute change except as noted in the HPI.  Physical Exam Updated Vital Signs BP 118/60 (BP Location: Right Arm)   Pulse 96   Temp 98.4 F (36.9 C) (Oral)   Resp 18   LMP 07/29/2011   SpO2 99%   Physical Exam  Constitutional: She  is oriented to person, place, and time. Vital signs are normal. She appears well-developed and well-nourished.  HENT:  Head: Normocephalic.  Right Ear: Hearing normal.  Left Ear: Hearing normal.  Eyes: Pupils are equal, round, and reactive to light. Conjunctivae and EOM are normal.  Cardiovascular: Normal rate and regular rhythm.   Pulmonary/Chest: Effort normal.  Neurological: She is alert and oriented to person, place, and time.  Skin: Skin is warm and dry.  Psychiatric: She has a normal mood and affect. Her speech is normal and behavior is normal. Thought content normal.     ED Treatments / Results  Labs (all labs ordered are listed, but only abnormal results are displayed) Labs Reviewed - No data to display  EKG  EKG Interpretation None       Radiology No results  found.  Procedures Procedures (including critical care time)  Medications Ordered in ED Medications - No data to display   Initial Impression / Assessment and Plan / ED Course  I have reviewed the triage vital signs and the nursing notes.  Pertinent labs & imaging results that were available during my care of the patient were reviewed by me and considered in my medical decision making (see chart for details).  Final Clinical Impressions(s) / ED Diagnoses     {I have reviewed the relevant previous healthcare records.  {I obtained HPI from historian.   ED Course:  Assessment: Pt is a 36 y.o. female presents to the Emergency Department today for medication refill. Pt ran out of medications over the weekend. Pt goes o [facility] for refills. Physician is out of town. Could not get appointment until November. Pt brought medications with her: Lamictal 200mg , Visteril 25mg , Paxil 20mg , Gabapentin 300mg . Refilled medications. Month supply given. Plan is to DC home with follow up to PCP. At time of discharge, Patient is in no acute distress. Vital Signs are stable. Patient is able to ambulate. Patient able to tolerate PO.    Disposition/Plan:  DC Home Additional Verbal discharge instructions given and discussed with patient.  Pt Instructed to f/u with PCP in the next week for evaluation and treatment of symptoms. Return precautions given Pt acknowledges and agrees with plan  Supervising Physician Milton Ferguson, MD  Final diagnoses:  Medication refill    New Prescriptions New Prescriptions   No medications on file     Shary Decamp, Hershal Coria 04/18/17 0102    Milton Ferguson, MD 04/19/17 [phone]

## 2017-04-18 NOTE — ED Notes (Signed)
Bed: WTR5 Expected date:  Expected time:  Means of arrival:  Comments: 

## 2017-05-06 ENCOUNTER — Ambulatory Visit (HOSPITAL_COMMUNITY): Payer: Medicaid Other

## 2017-05-06 ENCOUNTER — Ambulatory Visit (HOSPITAL_COMMUNITY)
Admission: EM | Admit: 2017-05-06 | Discharge: 2017-05-06 | Disposition: A | Discharge status: Home / Self Care | Payer: Medicaid Other | Attending: Student | Admitting: Student

## 2017-05-06 ENCOUNTER — Encounter (HOSPITAL_COMMUNITY): Payer: Self-pay | Admitting: Emergency Medicine

## 2017-05-06 DIAGNOSIS — S93402A Sprain of unspecified ligament of left ankle, initial encounter: Secondary | ICD-10-CM

## 2017-05-06 MED ORDER — TRAMADOL HCL 50 MG PO TABS: 50.0000 mg | ORAL_TABLET | Freq: Three times a day (TID) | ORAL | 0 refills | Status: DC | PRN

## 2017-05-06 NOTE — Discharge Instructions (Signed)
-  Elevate and apply ice to the left ankle over the weekend. -Minimal weightbearing to the left ankle. -Take medications as prescribed. -Follow-up with Urgent Care or PCP if no improvement.

## 2017-05-06 NOTE — ED Triage Notes (Signed)
Pt here for left ankle pain after tripping down stairs x 1 week ago

## 2017-05-06 NOTE — ED Provider Notes (Signed)
[facility]    CSN: [id number] Arrival date & time: 05/06/17  1301     History   Chief Complaint Chief Complaint  Patient presents with  . Ankle Pain    HPI Sabrina Andrews is a 36 y.o. female. presents today with a 1 week history of left ankle pain after a fall.  She was walking down steps when she twisted her left ankle and fell approx. 8 steps and reports a deformity to the left ankle, which she "popped" back over.  She has been walking on it with mild-moderate pain, she has increased pain when moving her ankle.  No numbness in the foot or previous history of surgery for the left ankle.  Presents today for further evaluation.  HPI  Past Medical History:  Diagnosis Date  . Asthma   . H/O LEEP (loop electrosurgical excision procedure) of cervix complicating pregnancy   . HPV (human papilloma virus) infection   . S/P tubal ligation   . Spinal hemangioma   . Vulvar dysplasia     Patient Active Problem List   Diagnosis Date Noted  . HPV (human papilloma virus) infection     Past Surgical History:  Procedure Laterality Date  . ABDOMINAL HYSTERECTOMY    . APPENDECTOMY    . CESAREAN SECTION    . CHOLECYSTECTOMY      OB History    Gravida Para Term Preterm AB Living   3 1 1          SAB TAB Ectopic Multiple Live Births                   Home Medications    Prior to Admission medications   Medication Sig Start Date End Date Taking? Authorizing Provider  acetaminophen (TYLENOL) 325 MG tablet Take 1,300 mg by mouth every 6 (six) hours as needed for moderate pain.    [provider]  doxycycline (VIBRAMYCIN) 100 MG capsule Take 1 capsule (100 mg total) by mouth 2 (two) times daily. One po bid x 7 days 03/22/17   Ripley Fraise, MD  escitalopram (LEXAPRO) 20 MG tablet Take 20 mg by mouth daily.    [provider]  gabapentin (NEURONTIN) 300 MG capsule Take 1 capsule (300 mg total) by mouth 3 (three) times daily. 04/18/17   Shary Decamp,  PA-C  HYDROcodone-acetaminophen (NORCO/VICODIN) 5-325 MG tablet Take 2 tablets by mouth every 6 (six) hours as needed for severe pain. 03/22/17   Ripley Fraise, MD  hydrOXYzine (VISTARIL) 25 MG capsule Take 1 capsule (25 mg total) by mouth 3 (three) times daily as needed. 04/18/17   Shary Decamp, PA-C  lamoTRIgine (LAMICTAL) 200 MG tablet Take 1 tablet (200 mg total) by mouth daily. 04/18/17 05/18/17  Shary Decamp, PA-C  PARoxetine (PAXIL) 20 MG tablet Take 1 tablet (20 mg total) by mouth daily. 04/18/17   Shary Decamp, PA-C  traMADol (ULTRAM) 50 MG tablet Take 1 tablet (50 mg total) every 8 (eight) hours as needed by mouth for moderate pain. 05/06/17   Lattie Corns, PA-C    Family History History reviewed. No pertinent family history.  Social History Social History   Tobacco Use  . Smoking status: Current Every Day Smoker    Packs/day: 0.25    Types: Cigarettes  . Smokeless tobacco: Never Used  Substance Use Topics  . Alcohol use: No  . Drug use: No     Allergies   Ibuprofen and Nsaids   Review of Systems Review of  Systems  Constitutional: Negative.   HENT: Negative.   Musculoskeletal: Positive for gait problem, joint swelling and myalgias.  Skin: Negative for color change.   Physical Exam Triage Vital Signs ED Triage Vitals [05/06/17 1349]  Enc Vitals Group     BP 128/77     Pulse Rate 84     Resp 18     Temp 97.9 F (36.6 C)     Temp Source Oral     SpO2 96 %     Weight      Height      Head Circumference      Peak Flow      Pain Score 8     Pain Loc      Pain Edu?      Excl. in [place]?    No data found.  Updated Vital Signs BP 128/77 (BP Location: Right Arm)   Pulse 84   Temp 97.9 F (36.6 C) (Oral)   Resp 18   LMP 07/29/2011   SpO2 96%   Visual Acuity Right Eye Distance:   Left Eye Distance:   Bilateral Distance:    Right Eye Near:   Left Eye Near:    Bilateral Near:     Physical Exam   Left Lower Extremity: Examination of the left  lower extremity reveals no bony abnormality, no edema, and no ecchymosis.  The patient is tender to palpation in the medial and lateral aspect of the ankle.  The patient has mild anterior talotibial joint discomfort.  The patient has full active and passive range of motion of the ankle with dorsiflexion, plantar flexion, inversion, and eversion with pain at the extremes.  The patient has no crepitus with range of motion activities.  The patient is moderate tender with motion.  The patient has  dorsiflexion and plantar flexion against stress, showing full muscle strength however does demonstrate moderate discomfort.  The patient has normal sensation to light touch.  The patient has a less than 2 second capillary refill.  The patient has normal skin warmth.    UC Treatments / Results  Labs (all labs ordered are listed, but only abnormal results are displayed) Labs Reviewed - No data to display  EKG  EKG Interpretation None       Radiology Dg Ankle Complete Left  Result Date: 05/06/2017 CLINICAL DATA:  Fall 1 week ago EXAM: LEFT ANKLE COMPLETE - 3+ VIEW COMPARISON:  None. FINDINGS: No acute fracture. No dislocation. Unremarkable soft tissues. Minimal spurring at the inferior calcaneus. IMPRESSION: No acute bony pathology. Electronically Signed   By: Marybelle Killings M.D.   On: 05/06/2017 14:40   Procedures Procedures (including critical care time)  Medications Ordered in UC Medications - No data to display  Initial Impression / Assessment and Plan / UC Course  I have reviewed the triage vital signs and the nursing notes.  Pertinent labs & imaging results that were available during my care of the patient were reviewed by me and considered in my medical decision making (see chart for details).     -X-rays negative for fracture. -Placed in ankle ASO brace, instructed to remain non-weightbearing for 24 hours and given tramadol for pain, will also take ibuprofen and tylenol as needed for  discomfort. -Return to clinic if symptoms don't improve.  Final Clinical Impressions(s) / UC Diagnoses   Final diagnoses:  Sprain of left ankle, unspecified ligament, initial encounter    ED Discharge Orders        Ordered  traMADol (ULTRAM) 50 MG tablet  Every 8 hours PRN     05/06/17 1445       Controlled Substance Prescriptions [place] Controlled Substance Registry consulted? Yes, I have consulted the [place] Controlled Substances Registry for this patient, and feel the risk/benefit ratio today is favorable for proceeding with this prescription for a controlled substance.   Lattie Corns, PA-C 05/06/17 [phone]

## 2017-07-05 ENCOUNTER — Emergency Department (HOSPITAL_COMMUNITY): Payer: Medicaid Other

## 2017-07-05 ENCOUNTER — Other Ambulatory Visit: Payer: Self-pay

## 2017-07-05 ENCOUNTER — Encounter (HOSPITAL_COMMUNITY): Payer: Self-pay | Admitting: Emergency Medicine

## 2017-07-05 ENCOUNTER — Emergency Department (HOSPITAL_COMMUNITY)
Admission: EM | Admit: 2017-07-05 | Discharge: 2017-07-06 | Disposition: A | Discharge status: Home / Self Care | Payer: Medicaid Other | Attending: Emergency Medicine | Admitting: Emergency Medicine

## 2017-07-05 DIAGNOSIS — F17210 Nicotine dependence, cigarettes, uncomplicated: Secondary | ICD-10-CM | POA: Diagnosis not present

## 2017-07-05 DIAGNOSIS — Z79899 Other long term (current) drug therapy: Secondary | ICD-10-CM | POA: Diagnosis not present

## 2017-07-05 DIAGNOSIS — B349 Viral infection, unspecified: Secondary | ICD-10-CM | POA: Diagnosis not present

## 2017-07-05 DIAGNOSIS — J45909 Unspecified asthma, uncomplicated: Secondary | ICD-10-CM | POA: Diagnosis not present

## 2017-07-05 DIAGNOSIS — R42 Dizziness and giddiness: Secondary | ICD-10-CM | POA: Diagnosis present

## 2017-07-05 LAB — CBC
HEMATOCRIT: 43.8 % (ref 36.0–46.0)
Hemoglobin: 14.2 g/dL (ref 12.0–15.0)
MCH: 30.5 pg (ref 26.0–34.0)
MCHC: 32.4 g/dL (ref 30.0–36.0)
MCV: 94.2 fL (ref 78.0–100.0)
PLATELETS: 268 10*3/uL (ref 150–400)
RBC: 4.65 MIL/uL (ref 3.87–5.11)
RDW: 15.2 % (ref 11.5–15.5)
WBC: 10.1 10*3/uL (ref 4.0–10.5)

## 2017-07-05 LAB — COMPREHENSIVE METABOLIC PANEL
ALBUMIN: 3.4 g/dL — AB (ref 3.5–5.0)
ALT: 63 U/L — AB (ref 14–54)
AST: 37 U/L (ref 15–41)
Alkaline Phosphatase: 104 U/L (ref 38–126)
Anion gap: 7 (ref 5–15)
BUN: 9 mg/dL (ref 6–20)
CHLORIDE: 99 mmol/L — AB (ref 101–111)
CO2: 30 mmol/L (ref 22–32)
CREATININE: 0.79 mg/dL (ref 0.44–1.00)
Calcium: 9.3 mg/dL (ref 8.9–10.3)
GFR calc Af Amer: >60 mL/min (ref >60)
GFR calc non Af Amer: >60 mL/min (ref >60)
Glucose, Bld: 120 mg/dL — ABNORMAL HIGH (ref 65–99)
Potassium: 4.2 mmol/L (ref 3.5–5.1)
Sodium: 136 mmol/L (ref 135–145)
Total Bilirubin: 0.5 mg/dL (ref 0.3–1.2)
Total Protein: 6.7 g/dL (ref 6.5–8.1)

## 2017-07-05 LAB — I-STAT BETA HCG BLOOD, ED (MC, WL, AP ONLY): I-stat hCG, quantitative: <5 m[IU]/mL (ref <5)

## 2017-07-05 LAB — URINALYSIS, ROUTINE W REFLEX MICROSCOPIC
Bilirubin Urine: NEGATIVE
GLUCOSE, UA: NEGATIVE mg/dL
Hgb urine dipstick: NEGATIVE
Ketones, ur: NEGATIVE mg/dL
Nitrite: NEGATIVE
PROTEIN: NEGATIVE mg/dL
Specific Gravity, Urine: 1.01 (ref 1.005–1.030)
pH: 7 (ref 5.0–8.0)

## 2017-07-05 LAB — LIPASE, BLOOD: LIPASE: 24 U/L (ref 11–51)

## 2017-07-05 MED ORDER — ONDANSETRON 4 MG PO TBDP
4.0000 mg | ORAL_TABLET | Freq: Once | ORAL | Status: AC | PRN
  Administered 2017-07-05: 4 mg via ORAL
  Filled 2017-07-05: qty 1

## 2017-07-05 NOTE — ED Triage Notes (Signed)
Dorothea Ogle, PA-C at bedside.

## 2017-07-05 NOTE — ED Notes (Signed)
Pt ambulatory to restroom with steady gait.

## 2017-07-05 NOTE — ED Notes (Signed)
Called x1 for vital signs. No answer.

## 2017-07-05 NOTE — ED Provider Notes (Signed)
[facility] EMERGENCY DEPARTMENT Provider Note   CSN: [id number] Arrival date & time: 07/05/17  1215     History   Chief Complaint Chief Complaint  Patient presents with  . Multiple Complaints    HPI Sabrina Andrews is a 37 y.o. female presenting for evaluation of dizziness, nausea, and abdominal pain.  Patient states 2 days ago, she developed dizziness, nausea, and abdominal pain.  The dizziness is present when she goes from sitting to standing.  She describes it as the room spinning.  Minimal dizziness with movement of her head.  She has associated nasal congestion and sore throat.  She does denies cough or fever.  Patient denies vision changes, but states over the past several days she has been running into objects.  Additionally, she reports she is falling asleep.  She is not taking her sleeping medicine, and is sleeping 3 hours a night.  She states this may be contributing to her excessive daytime sleepiness.   Additionally, patient reports lower abdominal abdominal pain.  Is described as a sharp spasm that is intermittent.  Pain cannot be elicited.  It resolves without intervention.  She has tried Tylenol with improvement of the pain.  She reports history of similar pain when she had kidney stones several years ago.  She denies urinary symptoms, but states pain is worse with urination.  She had blood in her urine yesterday.  She reports associated vomiting, has vomited x4 in the past 2 days.  She reports increased nausea postprandially.   HPI  Past Medical History:  Diagnosis Date  . Asthma   . H/O LEEP (loop electrosurgical excision procedure) of cervix complicating pregnancy   . HPV (human papilloma virus) infection   . S/P tubal ligation   . Spinal hemangioma   . Vulvar dysplasia     Patient Active Problem List   Diagnosis Date Noted  . HPV (human papilloma virus) infection     Past Surgical History:  Procedure Laterality Date  . ABDOMINAL  HYSTERECTOMY    . APPENDECTOMY    . CESAREAN SECTION    . CHOLECYSTECTOMY      OB History    Gravida Para Term Preterm AB Living   3 1 1          SAB TAB Ectopic Multiple Live Births                   Home Medications    Prior to Admission medications   Medication Sig Start Date End Date Taking? Authorizing Provider  acetaminophen (TYLENOL) 325 MG tablet Take 1,300 mg by mouth every 6 (six) hours as needed for moderate pain.   Yes [provider]  hydrOXYzine (VISTARIL) 25 MG capsule Take 1 capsule (25 mg total) by mouth 3 (three) times daily as needed. Patient taking differently: Take 50 mg by mouth 3 (three) times daily.  04/18/17  Yes Shary Decamp, PA-C  lamoTRIgine (LAMICTAL) 200 MG tablet Take 1 tablet (200 mg total) by mouth daily. 04/18/17 07/05/17 Yes Shary Decamp, PA-C  PARoxetine (PAXIL) 20 MG tablet Take 1 tablet (20 mg total) by mouth daily. 04/18/17  Yes Shary Decamp, PA-C  benzonatate (TESSALON) 100 MG capsule Take 1 capsule (100 mg total) by mouth every 8 (eight) hours. 07/06/17   Zaniyah Wernette, PA-C  doxycycline (VIBRAMYCIN) 100 MG capsule Take 1 capsule (100 mg total) by mouth 2 (two) times daily. One po bid x 7 days Patient not taking: Reported on 07/05/2017 03/22/17  Ripley Fraise, MD  fluticasone [facility]) 50 MCG/ACT nasal spray Place 1 spray into both nostrils daily. 07/06/17   Ruhaan Nordahl, PA-C  gabapentin (NEURONTIN) 300 MG capsule Take 1 capsule (300 mg total) by mouth 3 (three) times daily. Patient not taking: Reported on 07/05/2017 04/18/17   Shary Decamp, PA-C  HYDROcodone-acetaminophen (NORCO/VICODIN) 5-325 MG tablet Take 2 tablets by mouth every 6 (six) hours as needed for severe pain. Patient not taking: Reported on 07/05/2017 03/22/17   Ripley Fraise, MD  ondansetron (ZOFRAN) 4 MG tablet Take 1 tablet (4 mg total) by mouth every 8 (eight) hours as needed for nausea or vomiting. 07/06/17   Glenda Spelman, PA-C  traMADol (ULTRAM) 50 MG tablet  Take 1 tablet (50 mg total) every 8 (eight) hours as needed by mouth for moderate pain. Patient not taking: Reported on 07/05/2017 05/06/17   Lattie Corns, PA-C    Family History No family history on file.  Social History Social History   Tobacco Use  . Smoking status: Current Every Day Smoker    Packs/day: 0.25    Types: Cigarettes  . Smokeless tobacco: Never Used  Substance Use Topics  . Alcohol use: No  . Drug use: No     Allergies   Ibuprofen; Nsaids; and Tramadol   Review of Systems Review of Systems  HENT: Positive for congestion and sore throat.   Gastrointestinal: Positive for abdominal pain, nausea and vomiting.  Genitourinary: Positive for hematuria.  Psychiatric/Behavioral: Positive for sleep disturbance.  All other systems reviewed and are negative.    Physical Exam Updated Vital Signs BP 114/67 (BP Location: Right Arm)   Pulse 86   Temp 98.2 F (36.8 C) (Oral)   Resp 16   LMP 07/29/2011   SpO2 93%   Physical Exam  Constitutional: She is oriented to person, place, and time. She appears well-developed and well-nourished. No distress.  HENT:  Head: Normocephalic and atraumatic.  Right Ear: Tympanic membrane, external ear and ear canal normal.  Left Ear: Tympanic membrane, external ear and ear canal normal.  Nose: Mucosal edema present. Right sinus exhibits maxillary sinus tenderness and frontal sinus tenderness. Left sinus exhibits maxillary sinus tenderness and frontal sinus tenderness.  Mouth/Throat: Uvula is midline, oropharynx is clear and moist and mucous membranes are normal.  OP clear without tonsillar swelling or exudate.  TMs without erythema or bulging.  Tenderness palpation of frontal and maxillary sinuses.  Minimal nasal mucosal edema.  Eyes: Conjunctivae and EOM are normal. Pupils are equal, round, and reactive to light.  Neck: Normal range of motion.  Cardiovascular: Normal rate, regular rhythm and intact distal pulses.    Pulmonary/Chest: Effort normal and breath sounds normal. No respiratory distress. She has no wheezes.  Abdominal: Soft. Bowel sounds are normal. She exhibits no distension and no mass. There is tenderness. There is no rebound and no guarding.  Tenderness palpation of lower abdomen bilaterally. No rigidity, guarding, distention.  Normoactive bowel sounds x4.  Musculoskeletal: Normal range of motion.  Neurological: She is alert and oriented to person, place, and time. She has normal strength. No cranial nerve deficit or sensory deficit. GCS eye subscore is 4. GCS verbal subscore is 5. GCS motor subscore is 6.  Skin: Skin is warm and dry.  Psychiatric: She has a normal mood and affect.  Nursing note and vitals reviewed.    ED Treatments / Results  Labs (all labs ordered are listed, but only abnormal results are displayed) Labs Reviewed  COMPREHENSIVE METABOLIC PANEL -  Abnormal; Notable for the following components:      Result Value   Chloride 99 (*)    Glucose, Bld 120 (*)    Albumin 3.4 (*)    ALT 63 (*)    All other components within normal limits  URINALYSIS, ROUTINE W REFLEX MICROSCOPIC - Abnormal; Notable for the following components:   APPearance CLOUDY (*)    Leukocytes, UA TRACE (*)    Bacteria, UA RARE (*)    Squamous Epithelial / LPF 6-30 (*)    All other components within normal limits  LIPASE, BLOOD  CBC  I-STAT BETA HCG BLOOD, ED ([facility], [facility], [facility] ONLY)    EKG  EKG Interpretation None       Radiology Ct Head Wo Contrast  Result Date: 07/05/2017 CLINICAL DATA:  Ataxia.  Nausea. EXAM: CT HEAD WITHOUT CONTRAST TECHNIQUE: Contiguous axial images were obtained from the base of the skull through the vertex without intravenous contrast. COMPARISON:  July 10, 2006 FINDINGS: Brain: The ventricles are normal in size and configuration. There is no intracranial mass, hemorrhage, extra-axial fluid collection, or midline shift. Gray-white compartments are normal. No evident  acute infarct. Vascular: There is no appreciable hyperdense vessel. There is no evident vascular calcification. Skull: Bony calvarium appears intact. Sinuses/Orbits: There is mucosal thickening in several ethmoid air cells with opacification in several ethmoid air cells bilaterally. Other visualized paranasal sinuses are clear. Orbits appear symmetric bilaterally. Other: Mastoid air cells are clear. IMPRESSION: Ethmoid sinus disease.  Study otherwise unremarkable. Electronically Signed   By: Lowella Grip III M.D.   On: 07/05/2017 14:05    Procedures Procedures (including critical care time)  Medications Ordered in ED Medications  ondansetron (ZOFRAN-ODT) disintegrating tablet 4 mg (4 mg Oral Given 07/05/17 1317)     Initial Impression / Assessment and Plan / ED Course  I have reviewed the triage vital signs and the nursing notes.  Pertinent labs & imaging results that were available during my care of the patient were reviewed by me and considered in my medical decision making (see chart for details).     Patient presenting for evaluation of dizziness and abdominal pain.  Physical exam reassuring, no obvious neurologic deficits.  Patient's gait is ataxic.  Abdomen is soft nondistended.  Labs reassuring.  No white count.  Lipase normal.  Patient is afebrile not tachycardic.  She appears nontoxic.  Several low SPO2, the patient satting well while I am in the room.  CT head negative.  As patient reports she has history of kidney stones, will obtain urine to check for hematuria.  orthostatics negative.  Urine negative for blood.  Doubt kidney stone.  Doubt CVA or infectious etiology for dizziness.  Likely related to viral illness causing nasal congestion.  When I went in to reassess the patient, she was snoring and her oxygen sats were upper 80s.  When I woke her up, sats immediately rose to mid 90s.  Possible sleep apnea.  Discussed importance of follow-up with primary care for evaluation of  sleep apnea.  This may be the cause of her daytime tiredness.  Discussed with patient symptomatic treatment for viral illnesses.  At this time, patient appears safe for discharge.  Return precautions given.  Patient states she understands and agrees to plan.   Final Clinical Impressions(s) / ED Diagnoses   Final diagnoses:  Viral illness    ED Discharge Orders        Ordered    fluticasone (FLONASE) 50 MCG/ACT nasal spray  Daily     07/06/17 0001    ondansetron (ZOFRAN) 4 MG tablet  Every 8 hours PRN     07/06/17 0001    benzonatate (TESSALON) 100 MG capsule  Every 8 hours     07/06/17 0001       Myrella Fahs, PA-C 07/06/17 0029    Drenda Freeze, MD 07/07/17 1051

## 2017-07-05 NOTE — ED Notes (Addendum)
Pt from home with multiple complaints. Pt reports she has had multiple falls and trouble with her balance x 1 week. Pt also report N/V and lower abd pain since last night. Pt also reports lower back pain from her falls. Pt denies LOC when falling however reports she has hit her head. Pt A&Ox4, speaking in full sentences. Grips strong and equal bilaterally. Pt reports she notices the change in balance when she first gets up after sitting

## 2017-07-05 NOTE — ED Provider Notes (Signed)
Patient placed in Quick Look pathway, seen and evaluated for chief complaint of dizziness as well as abdominal pain. Both occurred yesterday.  Pertinent H&P findings include dizziness with ataxia. No headache. No visual changes. Notes N/V. No Neuro history. Notes lower abdominal pain without dysuria. BMs normal. No diarrhea. No vaginal bleeding/discharge. Based on initial evaluation, labs are indicated and radiology studies are indicated.  Patient counseled on process, plan, and necessity for staying for completing the evaluation.    Shary Decamp, PA-C 07/05/17 1315    Pattricia Boss, MD 07/05/17 1600

## 2017-07-05 NOTE — ED Notes (Signed)
Called x2 for vitals. No answer

## 2017-07-05 NOTE — ED Triage Notes (Signed)
Patient presents with multiple complaints, from head congestion to abdominal pain to multiple falls. Patient states she was sitting on the toilet this morning and just started to fall forward but caught herself, no LOC. Patient states, "I'll just be walking and then I run into the refrigerator and I didn't even see it." Denies vision problems, denies headache, endorses nausea but no vomiting. Patient A&O x 4, PERRL. Ambulatory with steady gait in triage.

## 2017-07-06 MED ORDER — BENZONATATE 100 MG PO CAPS: 100.0000 mg | ORAL_CAPSULE | Freq: Three times a day (TID) | ORAL | 0 refills | Status: DC

## 2017-07-06 MED ORDER — FLUTICASONE PROPIONATE 50 MCG/ACT NA SUSP: 1.0000 | Freq: Every day | NASAL | 0 refills | Status: DC

## 2017-07-06 MED ORDER — ONDANSETRON HCL 4 MG PO TABS: 4.0000 mg | ORAL_TABLET | Freq: Three times a day (TID) | ORAL | 0 refills | Status: DC | PRN

## 2017-07-06 NOTE — Discharge Instructions (Signed)
You likely have a viral illness.  This should be treated symptomatically. Use Tylenol as needed for fevers or body aches. Use Flonase daily for nasal congestion and cough. Take zofran as needed for nausea.  Use tessalon perles as needed for cough. It is very important that you stay well-hydrated with water. Wash your hands frequently to prevent spread of infection. Establish care with [facility].  The information is listed below. They may be able to help with your excessive sleepiness.  Return to the emergency room if you develop chest pain, difficulty breathing, or any new or worsening symptoms.

## 2018-01-01 ENCOUNTER — Other Ambulatory Visit: Payer: Self-pay

## 2018-01-01 ENCOUNTER — Emergency Department (HOSPITAL_COMMUNITY)
Admission: EM | Admit: 2018-01-01 | Discharge: 2018-01-01 | Disposition: A | Discharge status: Home / Self Care | Payer: Medicaid Other | Attending: Emergency Medicine | Admitting: Emergency Medicine

## 2018-01-01 ENCOUNTER — Encounter (HOSPITAL_COMMUNITY): Payer: Self-pay | Admitting: Emergency Medicine

## 2018-01-01 DIAGNOSIS — F1910 Other psychoactive substance abuse, uncomplicated: Secondary | ICD-10-CM | POA: Insufficient documentation

## 2018-01-01 DIAGNOSIS — F17210 Nicotine dependence, cigarettes, uncomplicated: Secondary | ICD-10-CM | POA: Diagnosis not present

## 2018-01-01 DIAGNOSIS — F1410 Cocaine abuse, uncomplicated: Secondary | ICD-10-CM | POA: Insufficient documentation

## 2018-01-01 DIAGNOSIS — J45909 Unspecified asthma, uncomplicated: Secondary | ICD-10-CM | POA: Diagnosis not present

## 2018-01-01 DIAGNOSIS — F1120 Opioid dependence, uncomplicated: Secondary | ICD-10-CM | POA: Insufficient documentation

## 2018-01-01 DIAGNOSIS — Z79899 Other long term (current) drug therapy: Secondary | ICD-10-CM | POA: Diagnosis not present

## 2018-01-01 DIAGNOSIS — T401X1A Poisoning by heroin, accidental (unintentional), initial encounter: Secondary | ICD-10-CM | POA: Insufficient documentation

## 2018-01-01 LAB — CBC WITH DIFFERENTIAL/PLATELET
BASOS ABS: 0 10*3/uL (ref 0.0–0.1)
Basophils Relative: 0 %
Eosinophils Absolute: 0.1 10*3/uL (ref 0.0–0.7)
Eosinophils Relative: 1 %
HEMATOCRIT: 40.6 % (ref 36.0–46.0)
HEMOGLOBIN: 13.5 g/dL (ref 12.0–15.0)
LYMPHS ABS: 2.3 10*3/uL (ref 0.7–4.0)
LYMPHS PCT: 31 %
MCH: 30.4 pg (ref 26.0–34.0)
MCHC: 33.3 g/dL (ref 30.0–36.0)
MCV: 91.4 fL (ref 78.0–100.0)
Monocytes Absolute: 0.5 10*3/uL (ref 0.1–1.0)
Monocytes Relative: 7 %
NEUTROS ABS: 4.6 10*3/uL (ref 1.7–7.7)
NEUTROS PCT: 61 %
Platelets: 233 10*3/uL (ref 150–400)
RBC: 4.44 MIL/uL (ref 3.87–5.11)
RDW: 13.8 % (ref 11.5–15.5)
WBC: 7.6 10*3/uL (ref 4.0–10.5)

## 2018-01-01 LAB — BASIC METABOLIC PANEL
ANION GAP: 8 (ref 5–15)
BUN: 9 mg/dL (ref 6–20)
CHLORIDE: 105 mmol/L (ref 98–111)
CO2: 26 mmol/L (ref 22–32)
Calcium: 8.6 mg/dL — ABNORMAL LOW (ref 8.9–10.3)
Creatinine, Ser: 0.76 mg/dL (ref 0.44–1.00)
GFR calc non Af Amer: >60 mL/min (ref >60)
Glucose, Bld: 117 mg/dL — ABNORMAL HIGH (ref 70–99)
POTASSIUM: 3.6 mmol/L (ref 3.5–5.1)
Sodium: 139 mmol/L (ref 135–145)

## 2018-01-01 LAB — RAPID URINE DRUG SCREEN, HOSP PERFORMED
AMPHETAMINES: NOT DETECTED
Benzodiazepines: NOT DETECTED
Cocaine: POSITIVE — AB
Opiates: POSITIVE — AB
TETRAHYDROCANNABINOL: NOT DETECTED

## 2018-01-01 MED ORDER — ONDANSETRON HCL 4 MG/2ML IJ SOLN: 4.0000 mg | Freq: Once | INTRAMUSCULAR | Status: DC

## 2018-01-01 MED ORDER — PROCHLORPERAZINE EDISYLATE 10 MG/2ML IJ SOLN
10.0000 mg | Freq: Once | INTRAMUSCULAR | Status: AC
  Administered 2018-01-01: 10 mg via INTRAVENOUS
  Filled 2018-01-01: qty 2

## 2018-01-01 MED ORDER — NALOXONE HCL 4 MG/0.1ML NA LIQD: NASAL | 0 refills | Status: AC

## 2018-01-01 MED ORDER — SODIUM CHLORIDE 0.9 % IV BOLUS
1000.0000 mL | Freq: Once | INTRAVENOUS | Status: AC
  Administered 2018-01-01: 1000 mL via INTRAVENOUS

## 2018-01-01 MED ORDER — SODIUM CHLORIDE 0.9 % IV BOLUS
500.0000 mL | Freq: Once | INTRAVENOUS | Status: AC
  Administered 2018-01-01: 500 mL via INTRAVENOUS

## 2018-01-01 MED ORDER — NALOXONE HCL 4 MG/0.1ML NA LIQD
1.0000 | Freq: Once | NASAL | Status: AC
Start: 2018-01-01 — End: 2018-01-01
  Administered 2018-01-01: 1 via NASAL
  Filled 2018-01-01: qty 4

## 2018-01-01 NOTE — Progress Notes (Signed)
TTS spoke with nurse secretary Jody who states she will advise ED Prichard to place telepsych cart in room.  Lind Covert, MSW, LCSW Therapeutic Triage Specialist  [phone]

## 2018-01-01 NOTE — BH Assessment (Addendum)
Tele Assessment Note   Patient Name: Sabrina Andrews MRN: [id number] Referring Physician: Aaron Edelman Location of Patient: [facility] Location of Provider: [facility] Doshi is an 37 y.o. female who presents to the ED voluntarily due to an unintentional OD of heroin. Pt states she does not recall how much heroin she used, however she had to be administered a total of 8mg  of Narcan. Pt denies that she was intentionally trying to harm herself. Pt denies any previous SI or self-harming behaviors. Pt states she has been abusing heroin for 5 years. Pt states she also uses "crack cocaine." Pt states she was in [facility] for 21 days and was just d/c on 12/31/17. Pt states she does not have a current provider.  Pt denies SI, HI, and denies AVH.  Pt lives with her aunt and states she "sometimes has support but not really." Pt states she is currently unemployed. Pt denies any other stressors or complaints. Pt denies any changes in appetite or sleeping patterns.   Per Patriciaann Clan, PA pt does not meet criteria for inpt treatment. Pt will be provided with OPT resources for SA treatment via fax to [phone]. Pt's nurse and EDP Dr. Tyrone Nine, MD have been advised of the disposition.  Diagnosis: Opioid use disorder; severe; Cocaine use disorder, severe  Past Medical History:  Past Medical History:  Diagnosis Date  . Asthma   . H/O LEEP (loop electrosurgical excision procedure) of cervix complicating pregnancy   . HPV (human papilloma virus) infection   . S/P tubal ligation   . Spinal hemangioma   . Vulvar dysplasia     Past Surgical History:  Procedure Laterality Date  . ABDOMINAL HYSTERECTOMY    . APPENDECTOMY    . CESAREAN SECTION    . CHOLECYSTECTOMY      Family History: History reviewed. No pertinent family history.  Social History:  reports that she has been smoking cigarettes.  She has been smoking about 0.25 packs per day. She has never used smokeless  tobacco. She reports that she does not drink alcohol or use drugs.  Additional Social History:  Alcohol / Drug Use Pain Medications: See MAR Prescriptions: See MAR Over the Counter: See MAR History of alcohol / drug use?: Yes Longest period of sobriety (when/how long): 21 days Substance #1 Name of Substance 1: Crack cocaine 1 - Age of First Use: 33 1 - Amount (size/oz): varies 1 - Frequency: rare 1 - Duration: ongoing 1 - Last Use / Amount: 12/31/17 Substance #2 Name of Substance 2: Heroin 2 - Age of First Use: 31 2 - Amount (size/oz): excess 2 - Frequency: daily 2 - Duration: ongoing 2 - Last Use / Amount: 12/31/17  CIWA: CIWA-Ar BP: (!) 100/57 Pulse Rate: 77 COWS:    Allergies:  Allergies  Allergen Reactions  . Ibuprofen Anaphylaxis and Hives  . Nsaids Anaphylaxis and Hives  . Tramadol Nausea And Vomiting    Home Medications:  (Not in a hospital admission)  OB/GYN Status:  Patient's last menstrual period was 07/29/2011.  General Assessment Data Assessment unable to be completed: Yes Reason for not completing assessment: TTS consult ordered at 05:09. TTS attempting to speak with the pt's nurse in order to set up telepsych cart for assessment. Called at 05:30 and placed on hold for upwards of 10 minutes. Unable to reach nurse to set up telepsych cart for the assessment.  Location of Assessment: [facility] TTS Assessment: In system Is this a  Tele or Face-to-Face Assessment?: Tele Assessment Is this an Initial Assessment or a Re-assessment for this encounter?: Initial Assessment Marital status: Single Is patient pregnant?: No Pregnancy Status: No Living Arrangements: Other relatives(aunt) Can pt return to current living arrangement?: Yes Admission Status: Voluntary Is patient capable of signing voluntary admission?: Yes Referral Source: Self/Family/Friend Insurance type: Medicaid     Crisis Care Plan Living Arrangements: Other relatives(aunt) Name of  Psychiatrist: none Name of Therapist: none  Education Status Is patient currently in school?: No Is the patient employed, unemployed or receiving disability?: Unemployed  Risk to self with the past 6 months Suicidal Ideation: No Has patient been a risk to self within the past 6 months prior to admission? : Yes Suicidal Intent: No Has patient had any suicidal intent within the past 6 months prior to admission? : No Is patient at risk for suicide?: No Suicidal Plan?: No Has patient had any suicidal plan within the past 6 months prior to admission? : No Access to Means: No What has been your use of drugs/alcohol within the last 12 months?: daily heroin and frequent cocaine use  Previous Attempts/Gestures: No Triggers for Past Attempts: None known Intentional Self Injurious Behavior: None Family Suicide History: No Recent stressful life event(s): Job Loss, Financial Problems, Turmoil (Comment)(substance abuse ) Persecutory voices/beliefs?: No Depression: No Substance abuse history and/or treatment for substance abuse?: Yes Suicide prevention information given to non-admitted patients: Not applicable  Risk to Others within the past 6 months Homicidal Ideation: No Does patient have any lifetime risk of violence toward others beyond the six months prior to admission? : No Thoughts of Harm to Others: No Current Homicidal Intent: No Current Homicidal Plan: No Access to Homicidal Means: No History of harm to others?: No Assessment of Violence: None Noted Does patient have access to weapons?: No Criminal Charges Pending?: No Does patient have a court date: No Is patient on probation?: No  Psychosis Hallucinations: None noted Delusions: None noted  Mental Status Report Appearance/Hygiene: Unremarkable, In hospital gown Eye Contact: Good Motor Activity: Freedom of movement Speech: Logical/coherent Level of Consciousness: Drowsy Mood: Euthymic Affect: Appropriate to  circumstance Anxiety Level: None Thought Processes: Relevant, Coherent Judgement: Impaired Orientation: Person, Place, Time, Situation, Appropriate for developmental age Obsessive Compulsive Thoughts/Behaviors: None  Cognitive Functioning Concentration: Normal Memory: Remote Intact, Recent Intact Is patient IDD: No Is patient DD?: No Insight: Poor Impulse Control: Poor Appetite: Good Have you had any weight changes? : No Change Sleep: Decreased Total Hours of Sleep: 5 Vegetative Symptoms: None  ADLScreening [facility] Assessment Services) Patient's cognitive ability adequate to safely complete daily activities?: Yes Patient able to express need for assistance with ADLs?: Yes Independently performs ADLs?: Yes (appropriate for developmental age)  Prior Inpatient Therapy Prior Inpatient Therapy: Yes Prior Therapy Dates: 2019 Prior Therapy Facilty/Provider(s): [facility] Reason for Treatment: SUBSTANCE ABUSE  Prior Outpatient Therapy Prior Outpatient Therapy: No Does patient have an ACCT team?: No Does patient have Intensive In-House Services?  : No Does patient have [facility] services? : No Does patient have P4CC services?: No  ADL Screening (condition at time of admission) Patient's cognitive ability adequate to safely complete daily activities?: Yes Is the patient deaf or have difficulty hearing?: No Does the patient have difficulty seeing, even when wearing glasses/contacts?: No Does the patient have difficulty concentrating, remembering, or making decisions?: No Patient able to express need for assistance with ADLs?: Yes Does the patient have difficulty dressing or bathing?: No Independently performs ADLs?: Yes (appropriate for developmental  age) Does the patient have difficulty walking or climbing stairs?: No Weakness of Legs: None Weakness of Arms/Hands: None  Home Assistive Devices/Equipment Home Assistive Devices/Equipment: None    Abuse/Neglect Assessment (Assessment to  be complete while patient is alone) Abuse/Neglect Assessment Can Be Completed: Yes Physical Abuse: Denies Verbal Abuse: Denies Sexual Abuse: Denies Exploitation of patient/patient's resources: Denies Self-Neglect: Denies     Regulatory affairs officer (For Healthcare) Does Patient Have a Medical Advance Directive?: No Would patient like information on creating a medical advance directive?: No - Patient declined    Additional Information 1:1 In Past 12 Months?: No CIRT Risk: No Elopement Risk: No Does patient have medical clearance?: Yes     Disposition: Per Patriciaann Clan, PA pt does not meet criteria for inpt treatment. Pt will be provided with OPT resources for SA treatment via fax to [phone]. Pt's nurse and EDP Dr. Tyrone Nine, MD have been advised of the disposition. Disposition Initial Assessment Completed for this Encounter: Yes Disposition of Patient: Discharge(w/ OPT resources for SA treatment per Patriciaann Clan, PA) Patient refused recommended treatment: No Mode of transportation if patient is discharged?: Car  This service was provided via telemedicine using a 2-way, interactive audio and video technology.  Names of all persons participating in this telemedicine service and their role in this encounter. Name: Sabrina Andrews Role: Patient  Name: Lind Covert Role: TTS          Lyanne Co 01/01/2018 6:12 AM

## 2018-01-01 NOTE — ED Provider Notes (Signed)
[facility] DEPT Provider Note   CSN: [id number] Arrival date & time: 01/01/18  0128     History   Chief Complaint Chief Complaint  Patient presents with  . Drug Overdose    HPI Sabrina Andrews is a 37 y.o. female.  HPI 37 year old Caucasian female medical history significant for polysubstance abuse presents to the ED for overdose on heroin.  Patient states that she was released from a detox facility after 2 weeks today for heroin detox.  Patient states that she was around her family who also use heroin.  She states that she told him that if she is around this that she will start using again.  Patient states that she has not used heroin while in detox for the past 2 and half weeks.  She did use heroin this evening.  Patient states that she woke up with EMS standing around her.  Patient states that she did not intentionally overdose to hurt herself.  Patient denies SI or HI.  Patient was given 8 mg of Narcan total.  She adamantly denies SI or HI at this time.  She denies any auditory visual hallucinations.  She states that where she has is bad for her and is causing her to relapse.  Patient denies any other drug ingestion.  Denies any alcohol use.  Patient reports some mild nausea at this time but denies any other associated symptoms including chest pain, shortness breath, lightheadedness, dizziness, abdominal pain, emesis, change in bowel habits, fevers, chills. Past Medical History:  Diagnosis Date  . Asthma   . H/O LEEP (loop electrosurgical excision procedure) of cervix complicating pregnancy   . HPV (human papilloma virus) infection   . S/P tubal ligation   . Spinal hemangioma   . Vulvar dysplasia     Patient Active Problem List   Diagnosis Date Noted  . HPV (human papilloma virus) infection     Past Surgical History:  Procedure Laterality Date  . ABDOMINAL HYSTERECTOMY    . APPENDECTOMY    . CESAREAN SECTION    . CHOLECYSTECTOMY       OB  History    Gravida  3   Para  1   Term  1   Preterm      AB      Living        SAB      TAB      Ectopic      Multiple      Live Births               Home Medications    Prior to Admission medications   Medication Sig Start Date End Date Taking? Authorizing Provider  citalopram (CELEXA) 20 MG tablet Take 20 mg by mouth daily.   Yes [provider]  lamoTRIgine (LAMICTAL) 25 MG tablet Take 25 mg by mouth 2 (two) times daily.   Yes [provider]  benzonatate (TESSALON) 100 MG capsule Take 1 capsule (100 mg total) by mouth every 8 (eight) hours. Patient not taking: Reported on 01/01/2018 07/06/17   Caccavale, Sophia, PA-C  doxycycline (VIBRAMYCIN) 100 MG capsule Take 1 capsule (100 mg total) by mouth 2 (two) times daily. One po bid x 7 days Patient not taking: Reported on 07/05/2017 03/22/17   Ripley Fraise, MD  fluticasone [facility]) 50 MCG/ACT nasal spray Place 1 spray into both nostrils daily. Patient not taking: Reported on 01/01/2018 07/06/17   Caccavale, Sophia, PA-C  gabapentin (NEURONTIN) 300 MG  capsule Take 1 capsule (300 mg total) by mouth 3 (three) times daily. Patient not taking: Reported on 07/05/2017 04/18/17   Shary Decamp, PA-C  HYDROcodone-acetaminophen (NORCO/VICODIN) 5-325 MG tablet Take 2 tablets by mouth every 6 (six) hours as needed for severe pain. Patient not taking: Reported on 07/05/2017 03/22/17   Ripley Fraise, MD  hydrOXYzine (VISTARIL) 25 MG capsule Take 1 capsule (25 mg total) by mouth 3 (three) times daily as needed. Patient not taking: Reported on 01/01/2018 04/18/17   Shary Decamp, PA-C  lamoTRIgine (LAMICTAL) 200 MG tablet Take 1 tablet (200 mg total) by mouth daily. 04/18/17 07/05/17  Shary Decamp, PA-C  ondansetron (ZOFRAN) 4 MG tablet Take 1 tablet (4 mg total) by mouth every 8 (eight) hours as needed for nausea or vomiting. Patient not taking: Reported on 01/01/2018 07/06/17   Caccavale, Sophia, PA-C  PARoxetine (PAXIL) 20 MG  tablet Take 1 tablet (20 mg total) by mouth daily. Patient not taking: Reported on 01/01/2018 04/18/17   Shary Decamp, PA-C  traMADol (ULTRAM) 50 MG tablet Take 1 tablet (50 mg total) every 8 (eight) hours as needed by mouth for moderate pain. Patient not taking: Reported on 07/05/2017 05/06/17   Lattie Corns, PA-C    Family History History reviewed. No pertinent family history.  Social History Social History   Tobacco Use  . Smoking status: Current Every Day Smoker    Packs/day: 0.25    Types: Cigarettes  . Smokeless tobacco: Never Used  Substance Use Topics  . Alcohol use: No  . Drug use: No     Allergies   Ibuprofen; Nsaids; and Tramadol   Review of Systems Review of Systems  All other systems reviewed and are negative.    Physical Exam Updated Vital Signs BP 96/64   Pulse 89   Temp 98.5 F (36.9 C) (Oral)   Resp 19   Ht 5\' 2"  (1.575 m)   Wt 77.1 kg (170 lb)   LMP 07/29/2011   SpO2 93%   BMI 31.09 kg/m   Physical Exam  Constitutional: She is oriented to person, place, and time. She appears well-developed and well-nourished.  Non-toxic appearance. No distress.  HENT:  Head: Normocephalic and atraumatic.  Nose: Nose normal.  Mouth/Throat: Oropharynx is clear and moist.  Eyes: Pupils are equal, round, and reactive to light. Conjunctivae are normal. Right eye exhibits no discharge. Left eye exhibits no discharge.  Neck: Normal range of motion. Neck supple.  Cardiovascular: Normal rate, regular rhythm, normal heart sounds and intact distal pulses.  Pulmonary/Chest: Effort normal and breath sounds normal. No respiratory distress. She exhibits no tenderness.  Abdominal: Soft. Bowel sounds are normal. There is no tenderness. There is no rebound and no guarding.  Musculoskeletal: Normal range of motion. She exhibits no tenderness.  Lymphadenopathy:    She has no cervical adenopathy.  Neurological: She is alert and oriented to person, place, and time.  Skin:  Skin is warm and dry. Capillary refill takes less than 2 seconds.  Psychiatric: Her behavior is normal. Judgment and thought content normal.  Nursing note and vitals reviewed.    ED Treatments / Results  Labs (all labs ordered are listed, but only abnormal results are displayed) Labs Reviewed  BASIC METABOLIC PANEL - Abnormal; Notable for the following components:      Result Value   Glucose, Bld 117 (*)    Calcium 8.6 (*)    All other components within normal limits  RAPID URINE DRUG SCREEN, HOSP PERFORMED -  Abnormal; Notable for the following components:   Opiates POSITIVE (*)    Cocaine POSITIVE (*)    Barbiturates   (*)    Value: Result not available. Reagent lot number recalled by manufacturer.   All other components within normal limits  CBC WITH DIFFERENTIAL/PLATELET    EKG None  Radiology No results found.  Procedures Procedures (including critical care time)  Medications Ordered in ED Medications  ondansetron (ZOFRAN) injection 4 mg (0 mg Intravenous Hold 01/01/18 0222)  sodium chloride 0.9 % bolus 500 mL (0 mLs Intravenous Stopped 01/01/18 0322)  prochlorperazine (COMPAZINE) injection 10 mg (10 mg Intravenous Given 01/01/18 0333)     Initial Impression / Assessment and Plan / ED Course  I have reviewed the triage vital signs and the nursing notes.  Pertinent labs & imaging results that were available during my care of the patient were reviewed by me and considered in my medical decision making (see chart for details).     Patient presents to the ED for heroin overdose.  Patient denies SI or HI.  She was recently released from detox facility.  Patient was given Narcan by EMS.  Patient has been alert oriented x3 in the ED today.  Patient is able to maintain her airway.  She is sleeping but easily aroused with verbal stimuli.  Basic lab work reassuring.  UDS positive for opiates and cocaine.  Vital signs remained reassuring.  Patient has been watched several hours in  the ED without any change in her mental status.  She is ambulated to bathroom with normal gait.  Tolerating p.o. fluids appropriately.  Patient denies SI or HI.  TTS did evaluate patient states that she does not meet inpatient criteria.  Patient will be discharged with family once they arrive.  Give outpatient resources.  Pt is hemodynamically stable, in NAD, & able to ambulate in the ED. Evaluation does not show pathology that would require ongoing emergent intervention or inpatient treatment. I explained the diagnosis to the patient. Pain has been managed & has no complaints prior to dc. Pt is comfortable with above plan and is stable for discharge at this time. All questions were answered prior to disposition. Strict return precautions for f/u to the ED were discussed. Encouraged follow up with PCP.   Final Clinical Impressions(s) / ED Diagnoses   Final diagnoses:  Accidental overdose of heroin, initial encounter [facility])    ED Discharge Orders    None       Aaron Edelman 01/01/18 [place], [place], DO 01/03/18 1203

## 2018-01-01 NOTE — Progress Notes (Signed)
Per Patriciaann Clan, PA pt does not meet criteria for inpt treatment. Pt will be provided with OPT resources for SA treatment via fax to [phone]. Pt's nurse and EDP Dr. Tyrone Nine, MD have been advised of the disposition.  Lind Covert, MSW, LCSW Therapeutic Triage Specialist  [phone]

## 2018-01-01 NOTE — ED Notes (Signed)
Pt ambulating to restroom without any difficulty

## 2018-01-01 NOTE — ED Provider Notes (Signed)
8:19 AM Patient signed out to me at shift change.  Patient with heroin overdose, found in the bathroom by her niece.  Apparently, niece now reports history of finding patient on the floor, face down in the bathroom.  After pt was not responding and door to the bathroom was locked, niece had to break in with a fork.  She found patient on the floor unconscious and not breathing, and she appears to be gray, she administered 2 sprays of Narcan in her nose that she had at home.  When that provided no relief, she actually injected a dose of Narcan into her vein, and when that did not seem to work EMS was called.  Pt has been monitored in ED. Labs unremarkable. VS normal.   Patient is now awake, alert, ate breakfast with no difficulty.  Ambulated in the hallway.  She is requesting outpatient resources for treatment.  Also requesting Narcan kit.  I will provide her with Narcan kit.  We will provide her with resources.  Her friends and family are at bedside, they will take her home.  At time of discharge, patient is in no acute distress, asymptomatic, vital signs are normal.  I instructed her not to use drugs.  Vitals:   01/01/18 0718 01/01/18 0719 01/01/18 0730 01/01/18 0800  BP: 97/65 (!) 102/58 94/64 98/79  Pulse: 72 82 77 83  Resp: _0 (!) 21  Temp:      TempSrc:      SpO2: 98% 95% 95% 98%  Weight:      Height:          Jeannett Senior, PA-C 01/01/18 0829    Virgel Manifold, MD 01/01/18 [phone]

## 2018-01-01 NOTE — ED Notes (Signed)
Pt able to drink fluids without difficulty during PO fluid trial

## 2018-01-01 NOTE — Discharge Instructions (Addendum)
There is help if you need it.  Please do not use dirty needles, this could cause you a severe infection to your skin, heart or spinal cord.  This could kill you or leave you permanently disabled.    [facility]id Problem ([facility]) Fixed; mobile; peer-based Chase Holleman [phone] [email or web address] Fixed site exchange at [facility], Wednesdays (2-5pm) and Thursdays (3-8pm). [address] [place], [place] [address] Call or text to arrange mobile and peer exchange, Mondays (1-4pm) and Fridays (4-7pm). [facility]

## 2018-01-01 NOTE — ED Notes (Signed)
Bed: RESB Expected date:  Expected time:  Means of arrival:  Comments: overdose 

## 2018-01-01 NOTE — Progress Notes (Signed)
TTS consult ordered at 05:09. TTS attempting to speak with the pt's nurse in order to set up telepsych cart for assessment. Called at 05:30 and placed on hold for upwards of 10 minutes. Unable to reach nurse to set up telepsych cart for the assessment.   Lind Covert, MSW, LCSW Therapeutic Triage Specialist  [phone]

## 2018-04-02 ENCOUNTER — Ambulatory Visit (HOSPITAL_COMMUNITY)
Admission: EM | Admit: 2018-04-02 | Discharge: 2018-04-02 | Discharge status: Against Medical Advice | Payer: Medicaid Other | Attending: Family Medicine | Admitting: Family Medicine

## 2018-04-02 ENCOUNTER — Encounter (HOSPITAL_COMMUNITY): Payer: Self-pay | Admitting: Family Medicine

## 2018-04-02 NOTE — ED Triage Notes (Signed)
PT LWBS °

## 2018-04-04 ENCOUNTER — Ambulatory Visit (HOSPITAL_COMMUNITY)
Admission: EM | Admit: 2018-04-04 | Discharge: 2018-04-04 | Disposition: A | Discharge status: Home / Self Care | Payer: Medicaid Other | Attending: Family Medicine | Admitting: Family Medicine

## 2018-04-04 ENCOUNTER — Encounter (HOSPITAL_COMMUNITY): Payer: Self-pay | Admitting: Emergency Medicine

## 2018-04-04 DIAGNOSIS — N898 Other specified noninflammatory disorders of vagina: Secondary | ICD-10-CM | POA: Diagnosis not present

## 2018-04-04 DIAGNOSIS — Z79899 Other long term (current) drug therapy: Secondary | ICD-10-CM | POA: Diagnosis not present

## 2018-04-04 DIAGNOSIS — Z886 Allergy status to analgesic agent status: Secondary | ICD-10-CM | POA: Diagnosis not present

## 2018-04-04 DIAGNOSIS — J45909 Unspecified asthma, uncomplicated: Secondary | ICD-10-CM | POA: Diagnosis not present

## 2018-04-04 DIAGNOSIS — K6289 Other specified diseases of anus and rectum: Secondary | ICD-10-CM | POA: Diagnosis not present

## 2018-04-04 DIAGNOSIS — Z888 Allergy status to other drugs, medicaments and biological substances status: Secondary | ICD-10-CM | POA: Diagnosis not present

## 2018-04-04 DIAGNOSIS — F17210 Nicotine dependence, cigarettes, uncomplicated: Secondary | ICD-10-CM | POA: Diagnosis not present

## 2018-04-04 DIAGNOSIS — Z9049 Acquired absence of other specified parts of digestive tract: Secondary | ICD-10-CM | POA: Diagnosis not present

## 2018-04-04 DIAGNOSIS — N9089 Other specified noninflammatory disorders of vulva and perineum: Secondary | ICD-10-CM

## 2018-04-04 LAB — OCCULT BLOOD, POC DEVICE: FECAL OCCULT BLD: POSITIVE — AB

## 2018-04-04 MED ORDER — AZITHROMYCIN 250 MG PO TABS
ORAL_TABLET | ORAL | Status: AC
  Filled 2018-04-04: qty 4

## 2018-04-04 MED ORDER — CEFTRIAXONE SODIUM 250 MG IJ SOLR
250.0000 mg | Freq: Once | INTRAMUSCULAR | Status: AC
  Administered 2018-04-04: 250 mg via INTRAMUSCULAR

## 2018-04-04 MED ORDER — STERILE WATER FOR INJECTION IJ SOLN
INTRAMUSCULAR | Status: AC
  Filled 2018-04-04: qty 10

## 2018-04-04 MED ORDER — CEFTRIAXONE SODIUM 250 MG IJ SOLR
INTRAMUSCULAR | Status: AC
  Filled 2018-04-04: qty 250

## 2018-04-04 MED ORDER — FLUCONAZOLE 150 MG PO TABS: 150.0000 mg | ORAL_TABLET | Freq: Once | ORAL | 0 refills | Status: AC

## 2018-04-04 MED ORDER — NITROGLYCERIN 0.4 % RE OINT: 1.0000 "application " | TOPICAL_OINTMENT | Freq: Two times a day (BID) | RECTAL | 0 refills | Status: AC

## 2018-04-04 MED ORDER — AZITHROMYCIN 250 MG PO TABS
1000.0000 mg | ORAL_TABLET | Freq: Once | ORAL | Status: AC
  Administered 2018-04-04: 1000 mg via ORAL

## 2018-04-04 NOTE — Discharge Instructions (Signed)
We have treated you today for gonorrhea and chlamydia, with Rocephin and azithromycin. Please refrain from sexual intercourse for 7 days while medicines eliminating infection.  Please take 1 tablet of Diflucan later today, may repeat in 3 days if still having symptoms.  Blood was present, rectal discomfort may be from an anal fissure, please perform sitz bath's, soaking in warm water couple times a day, applying ice/sitting on ice, may use nitroglycerin ointment twice a day  We are testing you for Gonorrhea, Chlamydia, Trichomonas, Yeast and Bacterial Vaginosis. We will call you if anything is positive and let you know if you require any further treatment. Please inform partners of any positive results.   Please return if symptoms not improving with treatment, development of fever, nausea, vomiting, abdominal pain.
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
## 2018-04-05 LAB — HIV ANTIBODY (ROUTINE TESTING W REFLEX): HIV SCREEN 4TH GENERATION: NONREACTIVE

## 2018-04-05 LAB — RPR: RPR Ser Ql: NONREACTIVE

## 2018-04-05 LAB — CERVICOVAGINAL ANCILLARY ONLY
Bacterial vaginitis: POSITIVE — AB
CANDIDA VAGINITIS: NEGATIVE
CHLAMYDIA, DNA PROBE: NEGATIVE
NEISSERIA GONORRHEA: NEGATIVE
TRICH (WINDOWPATH): POSITIVE — AB
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
## 2018-04-06 LAB — HSV CULTURE AND TYPING

## 2018-04-09 ENCOUNTER — Telehealth (HOSPITAL_COMMUNITY): Payer: Self-pay

## 2018-04-09 MED ORDER — METRONIDAZOLE 500 MG PO TABS: 500.0000 mg | ORAL_TABLET | Freq: Two times a day (BID) | ORAL | 0 refills | Status: DC

## 2018-04-09 NOTE — Telephone Encounter (Signed)
Trichomonas and BV is positive. Rx metronidazole 500mg  bid x 7d #14 no refills was sent to the pharmacy of record. Need to educate patient to refrain from sexual intercourse for 7 days to give the medicine time to work. Sexual partners need to be notified and tested/treated. Condoms may reduce risk of reinfection.  Attempted to reach patient. No answer at this time.

## 2018-04-11 ENCOUNTER — Telehealth (HOSPITAL_COMMUNITY): Payer: Self-pay

## 2018-04-11 NOTE — Telephone Encounter (Signed)
No answer x2 

## 2018-04-13 ENCOUNTER — Telehealth (HOSPITAL_COMMUNITY): Payer: Self-pay

## 2018-04-13 NOTE — Telephone Encounter (Signed)
No answer x 3. Letter sent to patient.

## 2018-07-24 ENCOUNTER — Encounter (HOSPITAL_COMMUNITY): Payer: Self-pay | Admitting: Internal Medicine

## 2018-07-24 ENCOUNTER — Inpatient Hospital Stay (HOSPITAL_COMMUNITY)
Admission: EM | Admit: 2018-07-24 | Discharge: 2018-07-26 | DRG: 580 | Discharge status: Against Medical Advice | Payer: Medicaid Other | Attending: Family Medicine | Admitting: Family Medicine

## 2018-07-24 ENCOUNTER — Observation Stay (HOSPITAL_COMMUNITY): Payer: Medicaid Other

## 2018-07-24 DIAGNOSIS — Z79899 Other long term (current) drug therapy: Secondary | ICD-10-CM

## 2018-07-24 DIAGNOSIS — F1123 Opioid dependence with withdrawal: Secondary | ICD-10-CM | POA: Diagnosis present

## 2018-07-24 DIAGNOSIS — J45909 Unspecified asthma, uncomplicated: Secondary | ICD-10-CM | POA: Diagnosis present

## 2018-07-24 DIAGNOSIS — N179 Acute kidney failure, unspecified: Secondary | ICD-10-CM | POA: Diagnosis present

## 2018-07-24 DIAGNOSIS — L02415 Cutaneous abscess of right lower limb: Secondary | ICD-10-CM | POA: Diagnosis present

## 2018-07-24 DIAGNOSIS — Z87412 Personal history of vulvar dysplasia: Secondary | ICD-10-CM

## 2018-07-24 DIAGNOSIS — B1920 Unspecified viral hepatitis C without hepatic coma: Secondary | ICD-10-CM | POA: Diagnosis present

## 2018-07-24 DIAGNOSIS — F1990 Other psychoactive substance use, unspecified, uncomplicated: Secondary | ICD-10-CM

## 2018-07-24 DIAGNOSIS — Z720 Tobacco use: Secondary | ICD-10-CM

## 2018-07-24 DIAGNOSIS — F319 Bipolar disorder, unspecified: Secondary | ICD-10-CM | POA: Diagnosis present

## 2018-07-24 DIAGNOSIS — Z886 Allergy status to analgesic agent status: Secondary | ICD-10-CM

## 2018-07-24 DIAGNOSIS — L03115 Cellulitis of right lower limb: Secondary | ICD-10-CM | POA: Diagnosis not present

## 2018-07-24 DIAGNOSIS — G8929 Other chronic pain: Secondary | ICD-10-CM | POA: Diagnosis present

## 2018-07-24 DIAGNOSIS — F1910 Other psychoactive substance abuse, uncomplicated: Secondary | ICD-10-CM

## 2018-07-24 DIAGNOSIS — F17210 Nicotine dependence, cigarettes, uncomplicated: Secondary | ICD-10-CM | POA: Diagnosis present

## 2018-07-24 DIAGNOSIS — D1809 Hemangioma of other sites: Secondary | ICD-10-CM | POA: Diagnosis present

## 2018-07-24 DIAGNOSIS — L02419 Cutaneous abscess of limb, unspecified: Secondary | ICD-10-CM

## 2018-07-24 DIAGNOSIS — Z90710 Acquired absence of both cervix and uterus: Secondary | ICD-10-CM

## 2018-07-24 DIAGNOSIS — Z885 Allergy status to narcotic agent status: Secondary | ICD-10-CM

## 2018-07-24 LAB — CBC WITH DIFFERENTIAL/PLATELET
Abs Immature Granulocytes: 0.06 10*3/uL (ref 0.00–0.07)
BASOS PCT: 0 %
Basophils Absolute: 0 10*3/uL (ref 0.0–0.1)
EOS ABS: 0.1 10*3/uL (ref 0.0–0.5)
Eosinophils Relative: 1 %
HCT: 47.3 % — ABNORMAL HIGH (ref 36.0–46.0)
Hemoglobin: 15.4 g/dL — ABNORMAL HIGH (ref 12.0–15.0)
IMMATURE GRANULOCYTES: 1 %
Lymphocytes Relative: 28 %
Lymphs Abs: 3.5 10*3/uL (ref 0.7–4.0)
MCH: 28.7 pg (ref 26.0–34.0)
MCHC: 32.6 g/dL (ref 30.0–36.0)
MCV: 88.1 fL (ref 80.0–100.0)
MONOS PCT: 7 %
Monocytes Absolute: 0.9 10*3/uL (ref 0.1–1.0)
NEUTROS ABS: 8.1 10*3/uL — AB (ref 1.7–7.7)
NEUTROS PCT: 63 %
NRBC: 0 % (ref 0.0–0.2)
PLATELETS: 330 10*3/uL (ref 150–400)
RBC: 5.37 MIL/uL — AB (ref 3.87–5.11)
RDW: 14.7 % (ref 11.5–15.5)
WBC: 12.7 10*3/uL — AB (ref 4.0–10.5)

## 2018-07-24 LAB — RAPID URINE DRUG SCREEN, HOSP PERFORMED
Amphetamines: NOT DETECTED
BARBITURATES: NOT DETECTED
Benzodiazepines: NOT DETECTED
COCAINE: POSITIVE — AB
Opiates: POSITIVE — AB
Tetrahydrocannabinol: POSITIVE — AB

## 2018-07-24 LAB — COMPREHENSIVE METABOLIC PANEL
ALT: 48 U/L — ABNORMAL HIGH (ref 0–44)
AST: 31 U/L (ref 15–41)
Albumin: 2.9 g/dL — ABNORMAL LOW (ref 3.5–5.0)
Alkaline Phosphatase: 91 U/L (ref 38–126)
Anion gap: 12 (ref 5–15)
BILIRUBIN TOTAL: 0.6 mg/dL (ref 0.3–1.2)
BUN: 10 mg/dL (ref 6–20)
CHLORIDE: 97 mmol/L — AB (ref 98–111)
CO2: 26 mmol/L (ref 22–32)
CREATININE: 0.64 mg/dL (ref 0.44–1.00)
Calcium: 9.4 mg/dL (ref 8.9–10.3)
Glucose, Bld: 86 mg/dL (ref 70–99)
Potassium: 3.6 mmol/L (ref 3.5–5.1)
Sodium: 135 mmol/L (ref 135–145)
TOTAL PROTEIN: 9.3 g/dL — AB (ref 6.5–8.1)

## 2018-07-24 LAB — LACTIC ACID, PLASMA: LACTIC ACID, VENOUS: 1.4 mmol/L (ref 0.5–1.9)

## 2018-07-24 MED ORDER — ONDANSETRON HCL 4 MG/2ML IJ SOLN
4.0000 mg | Freq: Four times a day (QID) | INTRAMUSCULAR | Status: DC | PRN
  Administered 2018-07-25: 4 mg via INTRAVENOUS
  Filled 2018-07-24: qty 2

## 2018-07-24 MED ORDER — VANCOMYCIN HCL 10 G IV SOLR
1500.0000 mg | Freq: Once | INTRAVENOUS | Status: AC
  Administered 2018-07-24: 1500 mg via INTRAVENOUS
  Filled 2018-07-24: qty 1500

## 2018-07-24 MED ORDER — VANCOMYCIN HCL 10 G IV SOLR
1750.0000 mg | INTRAVENOUS | Status: DC
  Administered 2018-07-24 – 2018-07-25 (×2): 1750 mg via INTRAVENOUS
  Filled 2018-07-24 (×2): qty 1750

## 2018-07-24 MED ORDER — ENOXAPARIN SODIUM 40 MG/0.4ML ~~LOC~~ SOLN
40.0000 mg | SUBCUTANEOUS | Status: DC
  Filled 2018-07-24 (×2): qty 0.4

## 2018-07-24 MED ORDER — ONDANSETRON HCL 4 MG PO TABS: 4.0000 mg | ORAL_TABLET | Freq: Four times a day (QID) | ORAL | Status: DC | PRN

## 2018-07-24 MED ORDER — HYDROMORPHONE HCL 1 MG/ML IJ SOLN
1.0000 mg | INTRAMUSCULAR | Status: AC
  Administered 2018-07-24: 1 mg via INTRAVENOUS
  Filled 2018-07-24: qty 1

## 2018-07-24 MED ORDER — LIDOCAINE-EPINEPHRINE (PF) 2 %-1:200000 IJ SOLN
20.0000 mL | Freq: Once | INTRAMUSCULAR | Status: AC
  Administered 2018-07-24: 20 mL
  Filled 2018-07-24: qty 20

## 2018-07-24 MED ORDER — SODIUM CHLORIDE 0.9 % IV SOLN
2.0000 g | INTRAVENOUS | Status: DC
  Administered 2018-07-25 – 2018-07-26 (×2): 2 g via INTRAVENOUS
  Filled 2018-07-24 (×2): qty 20

## 2018-07-24 MED ORDER — HYDROMORPHONE HCL 1 MG/ML IJ SOLN
1.0000 mg | Freq: Once | INTRAMUSCULAR | Status: AC
  Administered 2018-07-24: 1 mg via INTRAMUSCULAR
  Filled 2018-07-24: qty 1

## 2018-07-24 MED ORDER — PIPERACILLIN-TAZOBACTAM 3.375 G IVPB 30 MIN
3.3750 g | Freq: Once | INTRAVENOUS | Status: AC
  Administered 2018-07-24: 3.375 g via INTRAVENOUS
  Filled 2018-07-24: qty 50

## 2018-07-24 MED ORDER — ACETAMINOPHEN 500 MG PO TABS
1000.0000 mg | ORAL_TABLET | Freq: Three times a day (TID) | ORAL | Status: DC | PRN
  Administered 2018-07-24: 1000 mg via ORAL
  Filled 2018-07-24: qty 2

## 2018-07-24 NOTE — ED Provider Notes (Signed)
[place] EMERGENCY DEPARTMENT Provider Note   CSN: [id number] Arrival date & time: 07/24/18  1241     History   Chief Complaint No chief complaint on file.   HPI Sabrina Andrews is a 38 y.o. female with a past medical history of LEEP, status post hysterectomy, polysubstance abuse, who presents today for evaluation of right ankle pain.  5 days ago she reports that she injected IV drugs into her ankle.  She noticed a small bump there 2 days ago, however today the ankle has become significantly swollen, red, hot with what she says is a pocket of pus near the skin.  She reports inability to bear weight secondary to pain.  She denies any fevers or chills.    She states her last tetanus shot was 7 years ago.  HPI  Past Medical History:  Diagnosis Date  . Asthma   . H/O LEEP (loop electrosurgical excision procedure) of cervix complicating pregnancy   . HPV (human papilloma virus) infection   . S/P tubal ligation   . Spinal hemangioma   . Vulvar dysplasia     Patient Active Problem List   Diagnosis Date Noted  . Cellulitis and abscess of right lower extremity 07/24/2018  . HPV (human papilloma virus) infection     Past Surgical History:  Procedure Laterality Date  . ABDOMINAL HYSTERECTOMY    . APPENDECTOMY    . CESAREAN SECTION    . CHOLECYSTECTOMY       OB History    Gravida  3   Para  1   Term  1   Preterm      AB      Living        SAB      TAB      Ectopic      Multiple      Live Births               Home Medications    Prior to Admission medications   Medication Sig Start Date End Date Taking? Authorizing Provider  busPIRone (BUSPAR) 10 MG tablet Take 10 mg by mouth 3 (three) times daily.    [provider]  citalopram (CELEXA) 20 MG tablet Take 20 mg by mouth daily.    [provider]  gabapentin (NEURONTIN) 300 MG capsule Take 300 mg by mouth 3 (three) times daily.    [provider]    lamoTRIgine (LAMICTAL) 200 MG tablet Take 1 tablet (200 mg total) by mouth daily. 04/18/17 07/05/17  Shary Decamp, PA-C  lamoTRIgine (LAMICTAL) 25 MG tablet Take 25 mg by mouth 2 (two) times daily.    [provider]  metroNIDAZOLE (FLAGYL) 500 MG tablet Take 1 tablet (500 mg total) by mouth 2 (two) times daily. 04/09/18   Raylene Everts, MD  naloxone [facility]) nasal spray 4 mg/0.1 mL Use as needed 01/01/18   Jeannett Senior, PA-C  Nitroglycerin 0.4 % OINT Place 1 application rectally 2 times daily at 12 noon and 4 pm. 04/04/18   Wieters, Hallie C, PA-C  traZODone (DESYREL) 100 MG tablet Take 100 mg by mouth at bedtime.    [provider]    Family History No family history on file.  Social History Social History   Tobacco Use  . Smoking status: Current Every Day Smoker    Packs/day: 0.25    Types: Cigarettes  . Smokeless tobacco: Never Used  Substance Use Topics  . Alcohol use: No  .  Drug use: No     Allergies   Ibuprofen; Nsaids; and Tramadol   Review of Systems Review of Systems  Constitutional: Negative for chills and fever.  Musculoskeletal:       Ankle pain  Skin: Positive for color change and wound.  All other systems reviewed and are negative.    Physical Exam Updated Vital Signs BP 104/73 (BP Location: Right Arm)   Pulse 63   Temp 98.5 F (36.9 C) (Oral)   Resp 16   LMP 07/29/2011   SpO2 99%   Physical Exam Vitals signs and nursing note reviewed.  Constitutional:      General: She is not in acute distress.    Appearance: She is well-developed.  HENT:     Head: Normocephalic and atraumatic.  Eyes:     Conjunctiva/sclera: Conjunctivae normal.  Neck:     Musculoskeletal: Neck supple.  Cardiovascular:     Rate and Rhythm: Normal rate and regular rhythm.     Heart sounds: No murmur.     Comments: 2+ right DP pulse.  Pulmonary:     Effort: Pulmonary effort is normal. No respiratory distress.     Breath sounds: Normal breath  sounds.  Abdominal:     Palpations: Abdomen is soft.     Tenderness: There is no abdominal tenderness.  Musculoskeletal:     Comments: Patient has slight flexion of right ankle, however very limited secondary to pain.   Skin:    General: Skin is warm and dry.     Comments: Please see clinical image.  There is a large area of redness with edema on the medial aspect of patient's right ankle.  There appears to be purulent material close to the skin.  There is no obvious wound present.  Neurological:     Mental Status: She is alert.     Comments: Sensation intact to RLE.   Psychiatric:        Mood and Affect: Mood normal.        Behavior: Behavior normal.            ED Treatments / Results  Labs (all labs ordered are listed, but only abnormal results are displayed) Labs Reviewed  COMPREHENSIVE METABOLIC PANEL - Abnormal; Notable for the following components:      Result Value   Chloride 97 (*)    Total Protein 9.3 (*)    Albumin 2.9 (*)    ALT 48 (*)    All other components within normal limits  CBC WITH DIFFERENTIAL/PLATELET - Abnormal; Notable for the following components:   WBC 12.7 (*)    RBC 5.37 (*)    Hemoglobin 15.4 (*)    HCT 47.3 (*)    Neutro Abs 8.1 (*)    All other components within normal limits  CULTURE, BLOOD (ROUTINE X 2)  CULTURE, BLOOD (ROUTINE X 2)  AEROBIC/ANAEROBIC CULTURE (SURGICAL/DEEP WOUND)  LACTIC ACID, PLASMA  LACTIC ACID, PLASMA  RAPID URINE DRUG SCREEN, HOSP PERFORMED    EKG None  Radiology No results found.  Procedures .Marland KitchenIncision and Drainage Date/Time: 07/24/2018 8:09 PM Performed by: Lorin Glass, PA-C Authorized by: Lorin Glass, PA-C   Consent:    Consent obtained:  Verbal   Consent given by:  Patient   Risks discussed:  Bleeding, incomplete drainage, pain and infection (Damage to other structures, need for additional procedures)   Alternatives discussed:  No treatment, alternative treatment and  referral Location:    Type:  Abscess  Size:  4x4cm   Location:  Lower extremity   Lower extremity location:  Ankle   Ankle location:  R ankle Pre-procedure details:    Skin preparation:  Chloraprep Anesthesia (see MAR for exact dosages):    Anesthesia method:  Local infiltration   Local anesthetic:  Lidocaine 2% WITH epi Procedure type:    Complexity:  Complex Procedure details:    Needle aspiration: yes     Needle size:  18 G   Incision type: Needle aspiration then disection with scissors.    Incision depth:  Subcutaneous   Wound management:  Probed and deloculated, irrigated with saline and extensive cleaning   Drainage:  Purulent and bloody   Drainage amount:  Copious   Wound treatment:  Wound left open   Packing materials:  1/4 in iodoform gauze   Amount 1/4" iodoform:  One strip Post-procedure details:    Patient tolerance of procedure:  Tolerated with difficulty Comments:     Patient had difficulty with numbing medication, staying still during procedure.  Abscess appeared to track a total diameter of 4-5cm.  Probed, irrigated extensively.    (including critical care time)   Medications Ordered in ED Medications  vancomycin (VANCOCIN) 1,500 mg in sodium chloride 0.9 % 500 mL IVPB (has no administration in time range)  HYDROmorphone (DILAUDID) injection 1 mg (1 mg Intramuscular Given 07/24/18 1724)  lidocaine-EPINEPHrine (XYLOCAINE W/EPI) 2 %-1:200000 (PF) injection 20 mL (20 mLs Infiltration Given 07/24/18 1854)  HYDROmorphone (DILAUDID) injection 1 mg (1 mg Intravenous Given 07/24/18 1910)  HYDROmorphone (DILAUDID) injection 1 mg (1 mg Intravenous Given 07/24/18 1919)  piperacillin-tazobactam (ZOSYN) IVPB 3.375 g (3.375 g Intravenous New Bag/Given 07/24/18 1956)     Initial Impression / Assessment and Plan / ED Course  I have reviewed the triage vital signs and the nursing notes.  Pertinent labs & imaging results that were available during my care of the patient were  reviewed by me and considered in my medical decision making (see chart for details).  Clinical Course as of Jul 24 2037  Tue Jul 24, 2018  1478 Spoke with Orthopedics Dr. Alvan Dame who requests bedside I and D, medical admission.    [EH]    Clinical Course User Index [EH] Lorin Glass, PA-C   Patient is a 38 year old woman who presents today for evaluation of right ankle pain.  5 days ago she injected IV heroin into the area.  She reports that it got much worse today.  On exam she has a very large abscess of the medial aspect of her right ankle.  I spoke with Dr. Ihor Gully from orthopedics who recommended bedside I&D with medicine admission.  X-rays were ordered, however had not been obtained at this time.  I&D was performed with a very large amount of purulent material tracking under the skin.  Due to the size of the abscess cavity packing was placed.  Labs were obtained and reviewed, her white count is elevated at 12.7.  She is afebrile, not tachycardic or tachypneic.  Wound cultures were obtained.  Her lactic acid is not elevated.  Blood cultures were obtained prior to the administration of antibiotics.  Her pain was treated in the emergency room with Dilaudid.  This patient was seen as a shared visit with Dr. Cathleen Fears.  Given history of IV drug abuse with injection under this area, along with questionable streaking up the right anterior leg patient will require admission for IV antibiotics.   Final Clinical Impressions(s) / ED Diagnoses  Final diagnoses:  Abscess of ankle  IV drug abuse [facility])    ED Discharge Orders    None       Ollen Gross 07/24/18 2042    Orlie Dakin, MD 07/25/18 1446

## 2018-07-24 NOTE — ED Notes (Signed)
Report given to [id number] RN

## 2018-07-24 NOTE — ED Notes (Signed)
Pt refusing rectal temp  

## 2018-07-24 NOTE — H&P (Signed)
History and Physical    Sabrina Andrews BHA:[id number] DOB: [birth year] DOA: 07/24/2018  PCP: [facility] [place]  Patient coming from: Home  I have personally briefly reviewed patient's old medical records in [place]  Chief Complaint: Right ankle abscess  HPI: Sabrina Andrews is a 38 y.o. female with medical history significant for self reported Bipolar disorder/Depression, substance use disorder, and tobacco use who presents to the ED with right ankle swelling.  Patient states that she has been using IV heroin and injected in her right medial ankle about 6 days prior to admission.  About 3 days ago she noticed swelling in the area with progressive pain and difficulty bearing weight.  Earlier today she noticed underlying fluid without active discharge.  She therefore came to the ED for further evaluation and management.  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  She is a chronic smoker of 0.5 PPD since age 49.  She denies any alcohol use.  She reports a history of bipolar disorder previously prescribed Lamictal, Celexa, BuSpar, and trazodone; she has not taking any of these medications for 2 months since she started using IV drugs.  She reports a history of diabetes in both of her parents.  ED Course:  Showed BP 111/85, pulse 122, RR 18, temp 98.3 Fahrenheit, SPO2 97% on room air.  Labs notable for WBC 12.7, hemoglobin 15.4, platelets 330, lactic acid 1.4.  UDS positive for opiates, cocaine, THC.  Right ankle x-ray was negative for fracture, bone lesion, or evidence of osteomyelitis.  Orthopedics was consulted and recommended bedside I&D by EDP which was performed with report of significant purulent discharge which was sent for culture.  Blood cultures were also obtained and patient was started on IV vancomycin and Zosyn.  The hospital service was consulted to admit.   Review of Systems: As per HPI  otherwise 10 point review of systems negative.    Past Medical History:  Diagnosis Date  . Asthma   . H/O LEEP (loop electrosurgical excision procedure) of cervix complicating pregnancy   . HPV (human papilloma virus) infection   . S/P tubal ligation   . Spinal hemangioma   . Vulvar dysplasia     Past Surgical History:  Procedure Laterality Date  . ABDOMINAL HYSTERECTOMY    . APPENDECTOMY    . CESAREAN SECTION    . CHOLECYSTECTOMY       reports that she has been smoking cigarettes. She started smoking about 27 years ago. She has been smoking about 0.50 packs per day. She has never used smokeless tobacco. She reports current drug use. Drugs: IV and Heroin. She reports that she does not drink alcohol.  Allergies  Allergen Reactions  . Ibuprofen Anaphylaxis and Hives  . Nsaids Anaphylaxis and Hives  . Tramadol Nausea And Vomiting    Family History  Problem Relation Age of Onset  . Diabetes Mother   . Diabetes Father      Prior to Admission medications   Medication Sig Start Date End Date Taking? Authorizing Provider  busPIRone (BUSPAR) 10 MG tablet Take 10 mg by mouth 3 (three) times daily.    [provider]  citalopram (CELEXA) 20 MG tablet Take 20 mg by mouth daily.    [provider]  gabapentin (NEURONTIN) 300 MG capsule Take 300 mg by mouth 3 (three) times daily.    [provider]  lamoTRIgine (LAMICTAL) 200 MG tablet  Take 1 tablet (200 mg total) by mouth daily. 04/18/17 07/05/17  Shary Decamp, PA-C  lamoTRIgine (LAMICTAL) 25 MG tablet Take 25 mg by mouth 2 (two) times daily.    [provider]  metroNIDAZOLE (FLAGYL) 500 MG tablet Take 1 tablet (500 mg total) by mouth 2 (two) times daily. 04/09/18   Raylene Everts, MD  naloxone [facility]) nasal spray 4 mg/0.1 mL Use as needed 01/01/18   Jeannett Senior, PA-C  Nitroglycerin 0.4 % OINT Place 1 application rectally 2 times daily at 12 noon and 4 pm. 04/04/18   Wieters, Hallie C, PA-C   traZODone (DESYREL) 100 MG tablet Take 100 mg by mouth at bedtime.    [provider]    Physical Exam: Vitals:   07/24/18 1249 07/24/18 1759 07/24/18 2155  BP: 111/85 104/73 96/65  Pulse: (!) 122 63 97  Resp: 18 16 18   Temp: 98.3 F (36.8 C) 98.5 F (36.9 C) 100.2 F (37.9 C)  TempSrc:  Oral Oral  SpO2: 97% 99% 99%  Weight:   72.4 kg  Height:   5\' 2"  (1.575 m)    Constitutional: NAD, calm, comfortable, resting in right lateral decubitus position in bed Eyes: PERRL, lids and conjunctivae normal ENMT: Mucous membranes are moist. Posterior pharynx clear of any exudate or lesions. Normal dentition.  Neck: normal, supple, no masses. Respiratory: clear to auscultation bilaterally, no wheezing, no crackles. Normal respiratory effort. No accessory muscle use.  Cardiovascular: Regular rate and rhythm, no murmurs / rubs / gallops. No extremity edema. 2+ pedal pulses. Abdomen: no tenderness, no masses palpated. No hepatosplenomegaly. Bowel sounds positive.  Musculoskeletal: right medial abscess s/p I&D with surgical dressing in place, no clubbing / cyanosis. Good ROM, no contractures. Normal muscle tone.  Skin: right medial abscess s/p I&D with surgical dressing in place. Injection site indurated areas at right wrist, right arm just proximal to antecubital fossa, and left wrist with eschars and slight surrounding erythema without fluctuance, discharge, or tenderness to palpation. See pictures taken in ED below. Neurologic: CN 2-12 grossly intact. Sensation intact, DTR normal. Strength 5/5 in all 4.  Psychiatric: Normal judgment and insight. Alert and oriented x 3. Normal mood.            Labs on Admission: I have personally reviewed following labs and imaging studies  CBC: Recent Labs  Lab 07/24/18 1729  WBC 12.7*  NEUTROABS 8.1*  HGB 15.4*  HCT 47.3*  MCV 88.1  PLT 485   Basic Metabolic Panel: Recent Labs  Lab 07/24/18 1729  NA 135  K 3.6  CL 97*  CO2 26    GLUCOSE 86  BUN 10  CREATININE 0.64  CALCIUM 9.4   GFR: Estimated Creatinine Clearance: 87.9 mL/min (by C-G formula based on SCr of 0.64 mg/dL). Liver Function Tests: Recent Labs  Lab 07/24/18 1729  AST 31  ALT 48*  ALKPHOS 91  BILITOT 0.6  PROT 9.3*  ALBUMIN 2.9*   No results for input(s): LIPASE, AMYLASE in the last 168 hours. No results for input(s): AMMONIA in the last 168 hours. Coagulation Profile: No results for input(s): INR, PROTIME in the last 168 hours. Cardiac Enzymes: No results for input(s): CKTOTAL, CKMB, CKMBINDEX, TROPONINI in the last 168 hours. BNP (last 3 results) No results for input(s): PROBNP in the last 8760 hours. HbA1C: No results for input(s): HGBA1C in the last 72 hours. CBG: No results for input(s): GLUCAP in the last 168 hours. Lipid Profile: No results for input(s): CHOL,  HDL, LDLCALC, TRIG, CHOLHDL, LDLDIRECT in the last 72 hours. Thyroid Function Tests: No results for input(s): TSH, T4TOTAL, FREET4, T3FREE, THYROIDAB in the last 72 hours. Anemia Panel: No results for input(s): VITAMINB12, FOLATE, FERRITIN, TIBC, IRON, RETICCTPCT in the last 72 hours. Urine analysis:    Component Value Date/Time   COLORURINE YELLOW 07/05/2017 2259   APPEARANCEUR CLOUDY (A) 07/05/2017 2259   APPEARANCEUR Hazy 03/26/2013 1524   LABSPEC 1.010 07/05/2017 2259   LABSPEC 1.008 03/26/2013 1524   PHURINE 7.0 07/05/2017 2259   GLUCOSEU NEGATIVE 07/05/2017 2259   GLUCOSEU Negative 03/26/2013 1524   HGBUR NEGATIVE 07/05/2017 2259   BILIRUBINUR NEGATIVE 07/05/2017 2259   BILIRUBINUR Negative 03/26/2013 1524   KETONESUR NEGATIVE 07/05/2017 2259   PROTEINUR NEGATIVE 07/05/2017 2259   UROBILINOGEN 1.0 08/24/2014 0259   NITRITE NEGATIVE 07/05/2017 2259   LEUKOCYTESUR TRACE (A) 07/05/2017 2259   LEUKOCYTESUR 1+ 03/26/2013 1524    Radiological Exams on Admission: Dg Ankle Complete Right  Result Date: 07/24/2018 CLINICAL DATA:  Pt c/o right medial ankle  abscess after injecting IV drugs into her ankle 5 days ago. Pt's abscess was drained in the ED tonight. No hx of prior injuries or surgeries to the area. EXAM: RIGHT ANKLE - COMPLETE 3+ VIEW COMPARISON:  None. FINDINGS: No fracture or bone lesion. No bone resorption to suggest osteomyelitis. The ankle joint is normally spaced and aligned. No arthropathic changes. There is soft tissue swelling medially with evidence of a small amount of soft tissue air. No radiopaque foreign body. IMPRESSION: 1. No fracture, bone lesion or evidence of osteomyelitis. No ankle joint abnormality. 2. Soft tissue infection medially with a small amount of soft tissue air and significant soft tissue swelling. No radiopaque foreign body. Electronically Signed   By: Lajean Manes M.D.   On: 07/24/2018 21:02    EKG: Not obtained  Assessment/Plan Principal Problem:   Cellulitis and abscess of right lower extremity Active Problems:   Substance use disorder   Tobacco use   Bipolar disorder ([place])  Sabrina Andrews is a 38 y.o. female with medical history significant for self reported Bipolar disorder/Depression, substance use disorder, and tobacco use who is admitted with right medial ankle abscess.   Abscess of right medial ankle: Occurred at injection site. S/p I&D by EDP with wound packing in place.  Significant purulent material reported. -Continue IV vancomycin, switch Zosyn to ceftriaxone -Follow-up wound and blood cultures  Substance use disorder: Patient 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  UDS positive for cocaine and THC as well. -Chronic opiate withdrawal score every 4 hours -Consult to social work for current substance abuse  Bipolar disorder: She reports a history of bipolar disorder previously prescribed Lamictal, Celexa, BuSpar, and trazodone; she has not taking any of these medications for 2 months.  She reports occasional depression without suicidal thoughts. -We will hold off on restarting these medications for  now.  Will need to follow with psychiatry.    DVT prophylaxis: Lovenox Code Status: Full code Family Communication: None present at bedside on admission Disposition Plan: Pending continued need for IV antibiotics and availability of culture data Consults called: EDP discussed with on-call ortho Dr. Alvan Dame, call if needed in AM  Admission status: Observation   Zada Finders MD [facility] Pager [phone]  If 7PM-7AM, please contact night-coverage [email or web address]  07/24/2018, 10:17 PM

## 2018-07-24 NOTE — Progress Notes (Signed)
Pharmacy Antibiotic Note  Sabrina Andrews is a 38 y.o. female admitted on 07/24/2018 with wound infection.  Pharmacy has been consulted for vancomycin dosing.  Plan: Vancomycin 1750 mg IV every 24 hours.  Monitor clinical progress, cultures/sensitivities, renal function, abx plan Vancomycin peak/trough as indicated   Height: 5\' 2"  (157.5 cm) Weight: 159 lb 9.8 oz (72.4 kg) IBW/kg (Calculated) : 50.1  Temp (24hrs), Avg:99 F (37.2 C), Min:98.3 F (36.8 C), Max:100.2 F (37.9 C)  Recent Labs  Lab 07/24/18 1729 07/24/18 1732  WBC 12.7*  --   CREATININE 0.64  --   LATICACIDVEN  --  1.4    Estimated Creatinine Clearance: 87.9 mL/min (by C-G formula based on SCr of 0.64 mg/dL).    Allergies  Allergen Reactions  . Ibuprofen Anaphylaxis and Hives  . Nsaids Anaphylaxis and Hives  . Tramadol Nausea And Vomiting    Antimicrobials this admission: 1/28 vancomcyin >>   Dose adjustments this admission:   Microbiology results: 1/28 BCx: sent 1/28 Abscess: pending     Thank you for allowing [place] to participate in this patients care.   Jens Som, PharmD Please utilize Amion (under [place]) for appropriate number for your unit pharmacist. 07/24/2018 10:22 PM

## 2018-07-24 NOTE — ED Notes (Signed)
Admitting at bedside 

## 2018-07-24 NOTE — ED Triage Notes (Signed)
Pt here with a red and  swollen right ankle , pt thinks something bite her on the ankle

## 2018-07-24 NOTE — ED Provider Notes (Signed)
Patient reports that she injected heroin into her right medial ankle 5 days ago.  She developed pain and swelling at her right ankle 3 days ago.  Pain is worse with weightbearing.  She denies any fever.  Denies any other associated symptoms.  Last tetanus immunization was 7 years ago   Orlie Dakin, MD 07/24/18 1743

## 2018-07-25 ENCOUNTER — Other Ambulatory Visit: Payer: Self-pay

## 2018-07-25 DIAGNOSIS — L02415 Cutaneous abscess of right lower limb: Secondary | ICD-10-CM | POA: Diagnosis not present

## 2018-07-25 DIAGNOSIS — J45909 Unspecified asthma, uncomplicated: Secondary | ICD-10-CM | POA: Diagnosis present

## 2018-07-25 DIAGNOSIS — L03115 Cellulitis of right lower limb: Secondary | ICD-10-CM | POA: Diagnosis not present

## 2018-07-25 DIAGNOSIS — Z87412 Personal history of vulvar dysplasia: Secondary | ICD-10-CM | POA: Diagnosis not present

## 2018-07-25 DIAGNOSIS — F17210 Nicotine dependence, cigarettes, uncomplicated: Secondary | ICD-10-CM | POA: Diagnosis present

## 2018-07-25 DIAGNOSIS — Z79899 Other long term (current) drug therapy: Secondary | ICD-10-CM | POA: Diagnosis not present

## 2018-07-25 DIAGNOSIS — F319 Bipolar disorder, unspecified: Secondary | ICD-10-CM | POA: Diagnosis present

## 2018-07-25 DIAGNOSIS — G8929 Other chronic pain: Secondary | ICD-10-CM | POA: Diagnosis present

## 2018-07-25 DIAGNOSIS — F1123 Opioid dependence with withdrawal: Secondary | ICD-10-CM | POA: Diagnosis present

## 2018-07-25 DIAGNOSIS — D1809 Hemangioma of other sites: Secondary | ICD-10-CM | POA: Diagnosis present

## 2018-07-25 DIAGNOSIS — Z886 Allergy status to analgesic agent status: Secondary | ICD-10-CM | POA: Diagnosis not present

## 2018-07-25 DIAGNOSIS — Z90710 Acquired absence of both cervix and uterus: Secondary | ICD-10-CM | POA: Diagnosis not present

## 2018-07-25 DIAGNOSIS — Z885 Allergy status to narcotic agent status: Secondary | ICD-10-CM | POA: Diagnosis not present

## 2018-07-25 DIAGNOSIS — B1920 Unspecified viral hepatitis C without hepatic coma: Secondary | ICD-10-CM | POA: Diagnosis present

## 2018-07-25 DIAGNOSIS — N179 Acute kidney failure, unspecified: Secondary | ICD-10-CM | POA: Diagnosis present

## 2018-07-25 DIAGNOSIS — M25471 Effusion, right ankle: Secondary | ICD-10-CM | POA: Diagnosis not present

## 2018-07-25 LAB — CBC
HCT: 40.4 % (ref 36.0–46.0)
Hemoglobin: 13.1 g/dL (ref 12.0–15.0)
MCH: 28 pg (ref 26.0–34.0)
MCHC: 32.4 g/dL (ref 30.0–36.0)
MCV: 86.3 fL (ref 80.0–100.0)
NRBC: 0 % (ref 0.0–0.2)
Platelets: 300 10*3/uL (ref 150–400)
RBC: 4.68 MIL/uL (ref 3.87–5.11)
RDW: 14.5 % (ref 11.5–15.5)
WBC: 10.3 10*3/uL (ref 4.0–10.5)

## 2018-07-25 LAB — BASIC METABOLIC PANEL
Anion gap: 10 (ref 5–15)
BUN: 8 mg/dL (ref 6–20)
CALCIUM: 8.9 mg/dL (ref 8.9–10.3)
CO2: 26 mmol/L (ref 22–32)
Chloride: 103 mmol/L (ref 98–111)
Creatinine, Ser: 0.66 mg/dL (ref 0.44–1.00)
GFR calc Af Amer: >60 mL/min (ref >60)
GFR calc non Af Amer: >60 mL/min (ref >60)
Glucose, Bld: 91 mg/dL (ref 70–99)
Potassium: 3.5 mmol/L (ref 3.5–5.1)
Sodium: 139 mmol/L (ref 135–145)

## 2018-07-25 LAB — C-REACTIVE PROTEIN: CRP: 12.7 mg/dL — ABNORMAL HIGH (ref <1.0)

## 2018-07-25 LAB — SEDIMENTATION RATE: Sed Rate: 50 mm/hr — ABNORMAL HIGH (ref 0–22)

## 2018-07-25 MED ORDER — ACETAMINOPHEN 500 MG PO TABS
1000.0000 mg | ORAL_TABLET | Freq: Four times a day (QID) | ORAL | Status: DC | PRN
  Administered 2018-07-25: 1000 mg via ORAL
  Filled 2018-07-25: qty 2

## 2018-07-25 MED ORDER — BUPRENORPHINE HCL-NALOXONE HCL 2-0.5 MG SL SUBL
2.0000 | SUBLINGUAL_TABLET | Freq: Once | SUBLINGUAL | Status: AC
  Administered 2018-07-25: 2 via SUBLINGUAL
  Filled 2018-07-25: qty 2

## 2018-07-25 MED ORDER — TRAZODONE HCL 100 MG PO TABS
100.0000 mg | ORAL_TABLET | Freq: Every day | ORAL | Status: DC
  Administered 2018-07-25: 100 mg via ORAL
  Filled 2018-07-25: qty 1

## 2018-07-25 MED ORDER — HYDROMORPHONE HCL 1 MG/ML IJ SOLN
0.5000 mg | Freq: Once | INTRAMUSCULAR | Status: AC
  Administered 2018-07-25: 0.5 mg via INTRAVENOUS
  Filled 2018-07-25: qty 1

## 2018-07-25 MED ORDER — INFLUENZA VAC SPLIT QUAD 0.5 ML IM SUSY: 0.5000 mL | PREFILLED_SYRINGE | INTRAMUSCULAR | Status: DC | PRN

## 2018-07-25 MED ORDER — BUPRENORPHINE HCL-NALOXONE HCL 8-2 MG SL SUBL
1.0000 | SUBLINGUAL_TABLET | Freq: Two times a day (BID) | SUBLINGUAL | Status: DC
  Administered 2018-07-26: 1 via SUBLINGUAL
  Filled 2018-07-25 (×2): qty 1

## 2018-07-25 MED ORDER — BUPRENORPHINE HCL-NALOXONE HCL 2-0.5 MG SL SUBL
2.0000 | SUBLINGUAL_TABLET | SUBLINGUAL | Status: AC | PRN
  Administered 2018-07-25: 2 via SUBLINGUAL
  Filled 2018-07-25: qty 2

## 2018-07-25 MED ORDER — ACETAMINOPHEN 500 MG PO TABS
1000.0000 mg | ORAL_TABLET | Freq: Three times a day (TID) | ORAL | Status: DC
  Administered 2018-07-25 – 2018-07-26 (×2): 1000 mg via ORAL
  Filled 2018-07-25 (×2): qty 2

## 2018-07-25 MED ORDER — METRONIDAZOLE 500 MG PO TABS
500.0000 mg | ORAL_TABLET | Freq: Three times a day (TID) | ORAL | Status: DC
  Administered 2018-07-25 – 2018-07-26 (×3): 500 mg via ORAL
  Filled 2018-07-25 (×3): qty 1

## 2018-07-25 NOTE — Consult Note (Addendum)
[place] Nurse wound consult note Refer to previous note from ortho service.  [place] nurse requested to provide topical treatment orders and determine of hydrotherapy is needed for right ankle wound.  Pt had I& D performed earlier in the ED to this location. Reason for Consult: Full thickness wound to right ankle Wound bed: Large amt thick tan-red drainage in the packing and wound bed when removed.  Does NOT appear to need hydrotherapy, since there is no further nonviable tissue in the wound. Cleansed wound, which revealed 100% beefy red inner wound 1X1X.8cm, bone palpable when probed with a swab, surrounded by patchy areas of partial thickness skin loss to 3 cm around wound.  Dressing procedure/placement/frequency: Pt did not have pain meds ordered.  Packed wound with Iodoform strip and covered with gauze and tape.  Pt tolerated with mod amt discomfort.  Orders provided for bedside nurses to perform dressing changes daily, and demonstrated process to patient for her to perform after discharge and she verbalized understanding. Please re-consult if further assistance is needed.  Thank-you,  Julien Girt MSN, [place], [place], [place], [place]

## 2018-07-25 NOTE — Consult Note (Signed)
Reason for Consult: Right ankle superficial abscess and cellulitis Referring Physician: Emergency room physicians and Fayrene Helper, MD  Sabrina Andrews is an 38 y.o. female.  HPI: 38 year old female known, self-reported IV drug abuser who injected herself on the medial ankle.  She developed pain and erythema and presented to the emergency room for evaluation.  She was seen and evaluated in the emergency room.  Upon phone consultation they proceeded with a open lancing of her medial abscess.  She was subsequently mated to the medical service for IV antibiotics and further management.  She was seen and evaluated this morning.  She was complaining of pain and not feeling well likely related to her drug use.   No other reported areas of concern.  Past Medical History:  Diagnosis Date  . Asthma   . H/O LEEP (loop electrosurgical excision procedure) of cervix complicating pregnancy   . HPV (human papilloma virus) infection   . S/P tubal ligation   . Spinal hemangioma   . Vulvar dysplasia     Past Surgical History:  Procedure Laterality Date  . ABDOMINAL HYSTERECTOMY    . APPENDECTOMY    . CESAREAN SECTION    . CHOLECYSTECTOMY      Family History  Problem Relation Age of Onset  . Diabetes Mother   . Diabetes Father     Social History:  reports that she has been smoking cigarettes. She started smoking about 27 years ago. She has been smoking about 0.50 packs per day. She has never used smokeless tobacco. She reports current drug use. Drugs: IV and Heroin. She reports that she does not drink alcohol.  Allergies:  Allergies  Allergen Reactions  . Ibuprofen Anaphylaxis and Hives  . Nsaids Anaphylaxis and Hives  . Tramadol Nausea And Vomiting    Medications:  I have reviewed the patient's current medications. Scheduled: . enoxaparin (LOVENOX) injection  40 mg Subcutaneous Q24H    Results for orders placed or performed during the hospital encounter of 07/24/18 (from the  past 24 hour(s))  Culture, blood (routine x 2)     Status: None (Preliminary result)   Collection Time: 07/24/18  5:10 PM  Result Value Ref Range   Specimen Description BLOOD RIGHT HAND    Special Requests      BOTTLES DRAWN AEROBIC AND ANAEROBIC Blood Culture adequate volume   Culture      NO GROWTH < 12 HOURS Performed at [facility], [address], [place], [place] [address]    Report Status PENDING   Comprehensive metabolic panel     Status: Abnormal   Collection Time: 07/24/18  5:29 PM  Result Value Ref Range   Sodium 135 135 - 145 mmol/L   Potassium 3.6 3.5 - 5.1 mmol/L   Chloride 97 (L) 98 - 111 mmol/L   CO2 26 22 - 32 mmol/L   Glucose, Bld 86 70 - 99 mg/dL   BUN 10 6 - 20 mg/dL   Creatinine, Ser 0.64 0.44 - 1.00 mg/dL   Calcium 9.4 8.9 - 10.3 mg/dL   Total Protein 9.3 (H) 6.5 - 8.1 g/dL   Albumin 2.9 (L) 3.5 - 5.0 g/dL   AST 31 15 - 41 U/L   ALT 48 (H) 0 - 44 U/L   Alkaline Phosphatase 91 38 - 126 U/L   Total Bilirubin 0.6 0.3 - 1.2 mg/dL   GFR calc non Af Amer NOT CALCULATED >60 mL/min   GFR calc Af Amer NOT CALCULATED >60 mL/min  Anion gap 12 5 - 15  CBC with Differential     Status: Abnormal   Collection Time: 07/24/18  5:29 PM  Result Value Ref Range   WBC 12.7 (H) 4.0 - 10.5 K/uL   RBC 5.37 (H) 3.87 - 5.11 MIL/uL   Hemoglobin 15.4 (H) 12.0 - 15.0 g/dL   HCT 47.3 (H) 36.0 - 46.0 %   MCV 88.1 80.0 - 100.0 fL   MCH 28.7 26.0 - 34.0 pg   MCHC 32.6 30.0 - 36.0 g/dL   RDW 14.7 11.5 - 15.5 %   Platelets 330 150 - 400 K/uL   nRBC 0.0 0.0 - 0.2 %   Neutrophils Relative % 63 %   Neutro Abs 8.1 (H) 1.7 - 7.7 K/uL   Lymphocytes Relative 28 %   Lymphs Abs 3.5 0.7 - 4.0 K/uL   Monocytes Relative 7 %   Monocytes Absolute 0.9 0.1 - 1.0 K/uL   Eosinophils Relative 1 %   Eosinophils Absolute 0.1 0.0 - 0.5 K/uL   Basophils Relative 0 %   Basophils Absolute 0.0 0.0 - 0.1 K/uL   Immature Granulocytes 1 %   Abs Immature Granulocytes 0.06 0.00 - 0.07 K/uL   Lactic acid, plasma     Status: None   Collection Time: 07/24/18  5:32 PM  Result Value Ref Range   Lactic Acid, Venous 1.4 0.5 - 1.9 mmol/L  Culture, blood (routine x 2)     Status: None (Preliminary result)   Collection Time: 07/24/18  5:32 PM  Result Value Ref Range   Specimen Description BLOOD RIGHT FOREARM    Special Requests      BOTTLES DRAWN AEROBIC AND ANAEROBIC Blood Culture adequate volume   Culture      NO GROWTH < 12 HOURS Performed at [facility], [address] [place], [place] [address]    Report Status PENDING   Aerobic/Anaerobic Culture (surgical/deep wound)     Status: None (Preliminary result)   Collection Time: 07/24/18  6:55 PM  Result Value Ref Range   Specimen Description ABSCESS ANKLE    Special Requests Normal    Gram Stain      FEW WBC PRESENT, PREDOMINANTLY PMN MODERATE GRAM POSITIVE COCCI Performed at [facility], [place] [address], [place], [place] [address]    Culture PENDING    Report Status PENDING   Urine rapid drug screen (hosp performed)     Status: Abnormal   Collection Time: 07/24/18  9:07 PM  Result Value Ref Range   Opiates POSITIVE (A) NONE DETECTED   Cocaine POSITIVE (A) NONE DETECTED   Benzodiazepines NONE DETECTED NONE DETECTED   Amphetamines NONE DETECTED NONE DETECTED   Tetrahydrocannabinol POSITIVE (A) NONE DETECTED   Barbiturates NONE DETECTED NONE DETECTED  Basic metabolic panel     Status: None   Collection Time: 07/25/18  1:48 AM  Result Value Ref Range   Sodium 139 135 - 145 mmol/L   Potassium 3.5 3.5 - 5.1 mmol/L   Chloride 103 98 - 111 mmol/L   CO2 26 22 - 32 mmol/L   Glucose, Bld 91 70 - 99 mg/dL   BUN 8 6 - 20 mg/dL   Creatinine, Ser 0.66 0.44 - 1.00 mg/dL   Calcium 8.9 8.9 - 10.3 mg/dL   GFR calc non Af Amer >60 >60 mL/min   GFR calc Af Amer >60 >60 mL/min   Anion gap 10 5 - 15  CBC     Status: None  Collection Time: 07/25/18  1:48 AM  Result Value Ref Range   WBC 10.3 4.0 - 10.5 K/uL   RBC  4.68 3.87 - 5.11 MIL/uL   Hemoglobin 13.1 12.0 - 15.0 g/dL   HCT 40.4 36.0 - 46.0 %   MCV 86.3 80.0 - 100.0 fL   MCH 28.0 26.0 - 34.0 pg   MCHC 32.4 30.0 - 36.0 g/dL   RDW 14.5 11.5 - 15.5 %   Platelets 300 150 - 400 K/uL   nRBC 0.0 0.0 - 0.2 %    X-ray: CLINICAL DATA:  Pt c/o right medial ankle abscess after injecting IV drugs into her ankle 5 days ago. Pt's abscess was drained in the ED tonight. No hx of prior injuries or surgeries to the area.  EXAM: RIGHT ANKLE - COMPLETE 3+ VIEW  COMPARISON:  None.  FINDINGS: No fracture or bone lesion. No bone resorption to suggest osteomyelitis.  The ankle joint is normally spaced and aligned. No arthropathic changes.  There is soft tissue swelling medially with evidence of a small amount of soft tissue air. No radiopaque foreign body.  IMPRESSION: 1. No fracture, bone lesion or evidence of osteomyelitis. No ankle joint abnormality. 2. Soft tissue infection medially with a small amount of soft tissue air and significant soft tissue swelling. No radiopaque foreign body.   Electronically Signed   By: Lajean Manes M.D.  ROS  As noted in her admitting history and physical  Blood pressure 105/76, pulse 96, temperature 98.2 F (36.8 C), temperature source Oral, resp. rate 18, height 5\' 2"  (1.575 m), weight 72.4 kg, last menstrual period 07/29/2011, SpO2 99 %.  Physical Exam Right ankle dressed currently, scant blood on medial ankle.  Erythema noted isolated to the ankle without streaking cellulitis into the lower extremity. No calf tenderness.  General medical exam reviewed for pertinent findings. No evidence of any other upper extremity or left lower extremity abscesses.  Assessment/Plan: Assessment: Right ankle abscess following IV narcotic injection at medial ankle site 5 days prior to admission  Plan: At this point no further surgical intervention required. I would recommend IV antibiotics and transition to  oral antibiotics per phone consultation with infectious disease or transition her to Augmentin. Wound care consultation for wound management at home versus physical therapy consultation for aqua therapy to the right ankle wound while she is in the hospital.  Mauri Pole 07/25/2018, 7:41 AM

## 2018-07-25 NOTE — Plan of Care (Signed)
  Problem: Health Behavior/Discharge Planning: Goal: Ability to manage health-related needs will improve Outcome: Progressing   Problem: Activity: Goal: Risk for activity intolerance will decrease Outcome: Progressing   Problem: Nutrition: Goal: Adequate nutrition will be maintained Outcome: Progressing   Problem: Coping: Goal: Level of anxiety will decrease Outcome: Progressing   Problem: Pain Managment: Goal: General experience of comfort will improve Outcome: Progressing   Problem: Safety: Goal: Ability to remain free from injury will improve Outcome: Progressing   Problem: Skin Integrity: Goal: Risk for impaired skin integrity will decrease Outcome: Progressing   Problem: Clinical Measurements: Goal: Ability to avoid or minimize complications of infection will improve Outcome: Progressing   Problem: Skin Integrity: Goal: Skin integrity will improve Outcome: Progressing

## 2018-07-25 NOTE — Social Work (Signed)
CSW acknowledging consult for "Provide inpatient substance abuse counseling and provide the patient with community resources."  Per bedside RN pt in active withdrawal today, will likely f/u tomorrow with pt and provide substance abuse counseling as well as harm reduction community resources when pt more appropriate for conversation.   Westley Hummer, MSW, [place] [employer] [phone]

## 2018-07-25 NOTE — Progress Notes (Signed)
PROGRESS NOTE    Sabrina Andrews  OVZ:[id number] DOB: 03/27/1981 DOA: 07/24/2018 PCP: [facility]   Brief Narrative:  Sabrina Andrews is a 37 y.o. female with medical history significant for self reported Bipolar disorder/Depression, substance use disorder, and tobacco use who presents to the ED with right ankle swelling.  Patient states that she has been using IV heroin and injected in her right medial ankle about 6 days prior to admission.  About 3 days ago she noticed swelling in the area with progressive pain and difficulty bearing weight.  Earlier today she noticed underlying fluid without active discharge.  She therefore came to the ED for further evaluation and management.  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  She is a chronic smoker of 0.5 PPD since age 13.  She denies any alcohol use.  She reports a history of bipolar disorder previously prescribed Lamictal, Celexa, BuSpar, and trazodone; she has not taking any of these medications for 2 months since she started using IV drugs.  She reports a history of diabetes in both of her parents.   Assessment & Plan:   Principal Problem:   Cellulitis and abscess of right lower extremity Active Problems:   Substance use disorder   Tobacco use   Bipolar disorder (HCC)   Abscess of right medial ankle with surrounding cellulitis: Elevated temperature to 100.2 last night, elevated WBC, fever, tachycardia Occurred at injection site, she does lick her needles.  Plain films without evidence of osteo, though notable for soft tissue air and significant soft tissue swelling S/p I&D by EDP with wound packing in place, still with some purulent drainage Orthopedics recommended consider PT for aquatherapy while in house as well as discussing abx course with ID Will c/s PT and wound care Continue vanc/ceftriaxone for now, will likey need broad spectrum oral abx  at d/c (will discuss with ID prior to d/c) Blood cx pending Wound cx with moderate gram positive cocci -> follow final cx results  Substance use disorder  Opiate withdrawal: Patient 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  UDS positive for cocaine and THC as well. -Will start suboxone for withdrawal while in house (COWS while I was at bedside ~16), encouraged her to consider outpatient treatment (she's been somewhere in [place] before as well - she's also considering methadone) -Consult to social work for current substance abuse - HIV recently negative in 03/2018, acute hepatitis panel  Bipolar disorder: She reports a history of bipolar disorder previously prescribed Lamictal, Celexa, BuSpar, and trazodone; she has not taking any of these medications for 2 months.  She reports occasional depression without suicidal thoughts. -We will hold off on restarting these medications for now.  Will need to follow with psychiatry.  DVT prophylaxis: lovenox Code Status: full  Family Communication: none at bedside Disposition Plan: continue inpatient treatment with IV antibiotics given persistent purulent drainage, cellulitis around I&D site, awaiting wound culture result as well as blood culture results    Consultants:   orthopedics  Procedures:   I&D ED 1/18  Antimicrobials:  Anti-infectives (From admission, onward)   Start     Dose/Rate Route Frequency Ordered Stop   07/25/18 0200  cefTRIAXone (ROCEPHIN) 2 g in sodium chloride 0.9 % 100 mL IVPB     2 g 200 mL/hr over 30 Minutes Intravenous Every 24 hours 07/24/18 2158     07/24/18 2330  vancomycin (VANCOCIN) 1,750 mg in sodium chloride  0.9 % 500 mL IVPB     1,750 mg 250 mL/hr over 120 Minutes Intravenous Every 24 hours 07/24/18 2223     07/24/18 2030  vancomycin (VANCOCIN) 1,500 mg in sodium chloride 0.9 % 500 mL IVPB     1,500 mg 250 mL/hr over 120 Minutes Intravenous  Once 07/24/18 1943 07/24/18 2130   07/24/18 1945  piperacillin-tazobactam (ZOSYN)  IVPB 3.375 g     3.375 g 100 mL/hr over 30 Minutes Intravenous  Once 07/24/18 1942 07/24/18 2047     Subjective: C/o ankle pain and opiate withdrawal Has been sober before while on suboxone  Objective: Vitals:   07/24/18 1249 07/24/18 1759 07/24/18 2155 07/25/18 0550  BP: 111/85 104/73 96/65 105/76  Pulse: (!) 122 63 97 96  Resp: 18 16 18 18   Temp: 98.3 F (36.8 C) 98.5 F (36.9 C) 100.2 F (37.9 C) 98.2 F (36.8 C)  TempSrc:  Oral Oral Oral  SpO2: 97% 99% 99% 99%  Weight:   72.4 kg   Height:   5\' 2"  (1.575 m)     Intake/Output Summary (Last 24 hours) at 07/25/2018 1215 Last data filed at 07/25/2018 0300 Gross per 24 hour  Intake 1200 ml  Output -  Net 1200 ml   Filed Weights   07/24/18 2155  Weight: 72.4 kg    Examination:  General exam: Appears irritable and uncomfortable Respiratory system: Clear to auscultation. Respiratory effort normal. Cardiovascular system: S1 & S2 heard, mildly tachy. Gastrointestinal system: Abdomen is nondistended, soft and nontender Central nervous system: Alert and oriented. No focal neurological deficits.  Mild tremor.  Unable to sit still.    Extremities: Piloerection Skin: R ankle with erythema surrounding I&D site, some purulent drainage.  Moist brow, flushed. Psychiatry: Judgement and insight appear normal. Mood & affect appropriate.       Data Reviewed: I have personally reviewed following labs and imaging studies  CBC: Recent Labs  Lab 07/24/18 1729 07/25/18 0148  WBC 12.7* 10.3  NEUTROABS 8.1*  --   HGB 15.4* 13.1  HCT 47.3* 40.4  MCV 88.1 86.3  PLT 330 865   Basic Metabolic Panel: Recent Labs  Lab 07/24/18 1729 07/25/18 0148  NA 135 139  K 3.6 3.5  CL 97* 103  CO2 26 26  GLUCOSE 86 91  BUN 10 8  CREATININE 0.64 0.66  CALCIUM 9.4 8.9   GFR: Estimated Creatinine Clearance: 87.9 mL/min (by C-G formula based on SCr of 0.66 mg/dL). Liver Function Tests: Recent Labs  Lab 07/24/18 1729  AST 31  ALT  48*  ALKPHOS 91  BILITOT 0.6  PROT 9.3*  ALBUMIN 2.9*   No results for input(s): LIPASE, AMYLASE in the last 168 hours. No results for input(s): AMMONIA in the last 168 hours. Coagulation Profile: No results for input(s): INR, PROTIME in the last 168 hours. Cardiac Enzymes: No results for input(s): CKTOTAL, CKMB, CKMBINDEX, TROPONINI in the last 168 hours. BNP (last 3 results) No results for input(s): PROBNP in the last 8760 hours. HbA1C: No results for input(s): HGBA1C in the last 72 hours. CBG: No results for input(s): GLUCAP in the last 168 hours. Lipid Profile: No results for input(s): CHOL, HDL, LDLCALC, TRIG, CHOLHDL, LDLDIRECT in the last 72 hours. Thyroid Function Tests: No results for input(s): TSH, T4TOTAL, FREET4, T3FREE, THYROIDAB in the last 72 hours. Anemia Panel: No results for input(s): VITAMINB12, FOLATE, FERRITIN, TIBC, IRON, RETICCTPCT in the last 72 hours. Sepsis Labs: Recent Labs  Lab 07/24/18 1732  LATICACIDVEN 1.4    Recent Results (from the past 240 hour(s))  Culture, blood (routine x 2)     Status: None (Preliminary result)   Collection Time: 07/24/18  5:10 PM  Result Value Ref Range Status   Specimen Description BLOOD RIGHT HAND  Final   Special Requests   Final    BOTTLES DRAWN AEROBIC AND ANAEROBIC Blood Culture adequate volume   Culture   Final    NO GROWTH < 12 HOURS Performed at [facility], [address] [place], [place] [address]    Report Status PENDING  Incomplete  Culture, blood (routine x 2)     Status: None (Preliminary result)   Collection Time: 07/24/18  5:32 PM  Result Value Ref Range Status   Specimen Description BLOOD RIGHT FOREARM  Final   Special Requests   Final    BOTTLES DRAWN AEROBIC AND ANAEROBIC Blood Culture adequate volume   Culture   Final    NO GROWTH < 12 HOURS Performed at [facility], [address], [place], [place] [address]    Report Status PENDING  Incomplete  Aerobic/Anaerobic Culture  (surgical/deep wound)     Status: None (Preliminary result)   Collection Time: 07/24/18  6:55 PM  Result Value Ref Range Status   Specimen Description ABSCESS ANKLE  Final   Special Requests Normal  Final   Gram Stain   Final    FEW WBC PRESENT, PREDOMINANTLY PMN MODERATE GRAM POSITIVE COCCI Performed at [facility], [address], [place], [place] [address]    Culture PENDING  Incomplete   Report Status PENDING  Incomplete         Radiology Studies: Dg Ankle Complete Right  Result Date: 07/24/2018 CLINICAL DATA:  Pt c/o right medial ankle abscess after injecting IV drugs into her ankle 5 days ago. Pt's abscess was drained in the ED tonight. No hx of prior injuries or surgeries to the area. EXAM: RIGHT ANKLE - COMPLETE 3+ VIEW COMPARISON:  None. FINDINGS: No fracture or bone lesion. No bone resorption to suggest osteomyelitis. The ankle joint is normally spaced and aligned. No arthropathic changes. There is soft tissue swelling medially with evidence of a small amount of soft tissue air. No radiopaque foreign body. IMPRESSION: 1. No fracture, bone lesion or evidence of osteomyelitis. No ankle joint abnormality. 2. Soft tissue infection medially with a small amount of soft tissue air and significant soft tissue swelling. No radiopaque foreign body. Electronically Signed   By: Lajean Manes M.D.   On: 07/24/2018 21:02        Scheduled Meds: . [START ON 07/26/2018] buprenorphine-naloxone  1 tablet Sublingual BID  . enoxaparin (LOVENOX) injection  40 mg Subcutaneous Q24H   Continuous Infusions: . cefTRIAXone (ROCEPHIN)  IV Stopped (07/25/18 0238)  . vancomycin Stopped (07/25/18 0126)     LOS: 0 days    Time spent: over 30 min    Fayrene Helper, MD [facility] Pager AMION  If 7PM-7AM, please contact night-coverage [email or web address] Password [facility] 07/25/2018, 12:15 PM

## 2018-07-25 NOTE — Progress Notes (Signed)
Pt. arrived to the unit from the ED. A&O x4. Complaining of pain 6/10. Oriented to room and to call bell. Will continue to monitor.

## 2018-07-25 NOTE — Plan of Care (Signed)
  Problem: Education: Goal: Knowledge of General Education information will improve Description Including pain rating scale, medication(s)/side effects and non-pharmacologic comfort measures Outcome: Progressing   Problem: Health Behavior/Discharge Planning: Goal: Ability to manage health-related needs will improve Outcome: Progressing   Problem: Clinical Measurements: Goal: Ability to maintain clinical measurements within normal limits will improve Outcome: Progressing Goal: Will remain free from infection Outcome: Progressing Goal: Diagnostic test results will improve Outcome: Progressing   Problem: Activity: Goal: Risk for activity intolerance will decrease Outcome: Progressing   Problem: Nutrition: Goal: Adequate nutrition will be maintained Outcome: Progressing   Problem: Coping: Goal: Level of anxiety will decrease Outcome: Progressing   Problem: Pain Managment: Goal: General experience of comfort will improve Outcome: Progressing   Problem: Clinical Measurements: Goal: Ability to avoid or minimize complications of infection will improve Outcome: Progressing   Problem: Skin Integrity: Goal: Skin integrity will improve Outcome: Progressing

## 2018-07-26 ENCOUNTER — Inpatient Hospital Stay (HOSPITAL_COMMUNITY): Payer: Medicaid Other

## 2018-07-26 LAB — COMPREHENSIVE METABOLIC PANEL
ALT: 37 U/L (ref 0–44)
AST: 33 U/L (ref 15–41)
Albumin: 2.4 g/dL — ABNORMAL LOW (ref 3.5–5.0)
Alkaline Phosphatase: 65 U/L (ref 38–126)
Anion gap: 12 (ref 5–15)
BUN: 15 mg/dL (ref 6–20)
CO2: 24 mmol/L (ref 22–32)
Calcium: 9 mg/dL (ref 8.9–10.3)
Chloride: 105 mmol/L (ref 98–111)
Creatinine, Ser: 2.04 mg/dL — ABNORMAL HIGH (ref 0.44–1.00)
GFR calc Af Amer: 35 mL/min — ABNORMAL LOW (ref >60)
GFR calc non Af Amer: 30 mL/min — ABNORMAL LOW (ref >60)
GLUCOSE: 119 mg/dL — AB (ref 70–99)
Potassium: 3.1 mmol/L — ABNORMAL LOW (ref 3.5–5.1)
SODIUM: 141 mmol/L (ref 135–145)
Total Bilirubin: 0.5 mg/dL (ref 0.3–1.2)
Total Protein: 7.4 g/dL (ref 6.5–8.1)

## 2018-07-26 LAB — CBC
HCT: 41.3 % (ref 36.0–46.0)
Hemoglobin: 13.3 g/dL (ref 12.0–15.0)
MCH: 27.8 pg (ref 26.0–34.0)
MCHC: 32.2 g/dL (ref 30.0–36.0)
MCV: 86.2 fL (ref 80.0–100.0)
Platelets: 288 10*3/uL (ref 150–400)
RBC: 4.79 MIL/uL (ref 3.87–5.11)
RDW: 14.3 % (ref 11.5–15.5)
WBC: 8.1 10*3/uL (ref 4.0–10.5)
nRBC: 0 % (ref 0.0–0.2)

## 2018-07-26 LAB — HEPATITIS PANEL, ACUTE
HCV Ab: >11 s/co ratio — ABNORMAL HIGH (ref 0.0–0.9)
HEP B C IGM: NEGATIVE
HEP B S AG: NEGATIVE
Hep A IgM: NEGATIVE

## 2018-07-26 LAB — MRSA PCR SCREENING: MRSA BY PCR: POSITIVE — AB

## 2018-07-26 LAB — MAGNESIUM: Magnesium: 2.1 mg/dL (ref 1.7–2.4)

## 2018-07-26 MED ORDER — LORAZEPAM 2 MG/ML IJ SOLN
1.0000 mg | Freq: Once | INTRAMUSCULAR | Status: AC | PRN
  Administered 2018-07-26: 1 mg via INTRAVENOUS
  Filled 2018-07-26: qty 1

## 2018-07-26 MED ORDER — LORAZEPAM 2 MG/ML IJ SOLN: 0.5000 mg | Freq: Once | INTRAMUSCULAR | Status: DC | PRN

## 2018-07-26 MED ORDER — DOXYCYCLINE HYCLATE 100 MG PO CAPS: 100.0000 mg | ORAL_CAPSULE | Freq: Two times a day (BID) | ORAL | 0 refills | Status: AC

## 2018-07-26 MED ORDER — AMOXICILLIN-POT CLAVULANATE 500-125 MG PO TABS: 1.0000 | ORAL_TABLET | Freq: Two times a day (BID) | ORAL | 0 refills | Status: AC

## 2018-07-26 NOTE — Social Work (Signed)
CSW met with pt at bedside prior to discharging AMA. Pt distracted and focused on her mom picking her up at noon. Pt accepting of resources. We discussed GCSTOP program and safe injection tools. CSW encouraged pt to have narcan with her if she or friends continue to use in accordance with harm reduction practices.   CSW also provided pt with outpatient methadone/suboxone programs and cessation resources. Pt has been to [facility] in the past and states that she has an interest in returning there. All information provided and placed in pt belongings bag at her request.   Encouraged pt to remain in hospital, however she is keen on discharging today.  CSW signing off. Please consult if any additional needs arise.  Alexander Mt, [place] Work [phone]

## 2018-07-26 NOTE — Progress Notes (Signed)
Several attempts to meet patient-finally met patient who has decided to go home-even though Doctor came in and advised her of several concerns-especially her kidney function changes and he really feels she needs to be here to watch that.  She said she has to go home.  I didn't have much opportunity to talk or listen with her as she answered phone and was getting dressed to leave-her ride is coming.  I asked could we do anything to help her with her children because of great concern she needs care and leaving could be a risk of even death ( as doctor has spoken)  because of health concerns.  I asked about getting a Education officer, museum to help her figure out help with kids and she was very afraid and said no-social workers she knew of took kids away. She was obviously afraid of her kids being taken away and mentioned no father.  The doctor pleaded with her to please not leave and she said she has to.  Very concerning.  The doctor asked her to please go in to see a medical doctor tomorrow due to significant concerns and she said she would.  I do not know ages of children.  She described her swollen infected ankle and that it had to be drained and told me her drug use had caused it.  I am very concerned about this patient.  Conard Novak, Chaplain   07/26/18 1100  Clinical Encounter Type  Visited With Patient;Health care provider  Visit Type Initial  Referral From Nurse  Consult/Referral To Chaplain  Spiritual Encounters  Spiritual Needs Other (Comment) (she says she has to go home to care for children-not staying)

## 2018-07-26 NOTE — Care Management Note (Signed)
Case Management Note  Patient Details  Name: Sabrina Andrews MRN: [id number] Date of Birth: February 13, 1981  Subjective/Objective:                    Action/Plan:  Patient discharging AMA. Spoke to patient at bedside. Patient does have Medicaid and has money for co pay for prescriptions and transportation to pharmacy. Also, patient has PCP at [facility] in [facility] and patient states her mother will take her there for follow up. Expected Discharge Date:                  Expected Discharge Plan:  Home/Self Care  In-House Referral:  Clinical Social Work  Discharge planning Services  CM Consult  Post Acute Care Choice:  NA Choice offered to:  Patient  DME Arranged:  N/A DME Agency:  NA  HH Arranged:  NA HH Agency:  NA  Status of Service:  Completed, signed off  If discussed at [place] of Stay Meetings, dates discussed:    Additional Comments:  Marilu Favre, RN 07/26/2018, 12:12 PM

## 2018-07-26 NOTE — Progress Notes (Signed)
Attempted MRI exam and patient unable to tolerate laying on her back due to pain. Comfort measures attempted and still unable to tolerate. RN gave ativan and patient stated she was still not able to do exam. RN notified and patient sent back. RN to call if patient is agreeable to try again later.

## 2018-07-26 NOTE — Discharge Summary (Addendum)
Physician Discharge Summary  Sabrina Andrews NTI:[id number] DOB: 09-26-1980 DOA: 07/24/2018  PCP: [facility]  Admit date: 07/24/2018 Discharge date: 07/26/2018  Time spent: 40 minutes  Recommendations for Outpatient Follow-up:  1. Patient left AMA 2. She was discharged with augmentin and doxycycline (augmentin dose may need to be adjusted based on renal function) 3. She'll need follow up for her kidney function as well as imaging of her right lower extremity  4. Also needs follow up of her positive hepatitis C ab 5. Follow up diarrhea, possibly related to withdrawal 6. Discussed above with pt  Discharge Diagnoses:  Principal Problem:   Cellulitis and abscess of right lower extremity Active Problems:   Substance use disorder   Tobacco use   Bipolar disorder [facility])   Discharge Condition: stable  Diet recommendation: heart healthy  Filed Weights   07/24/18 2155  Weight: 72.4 kg    History of present illness:  Sabrina Andrews Sabrina Andrews 38 y.o.femalewith medical history significant forself reported Bipolar disorder/Depression, substance use disorder, and tobacco use whopresents to the ED with right ankle swelling. Patient states that she has been using IV heroin and injected in her right medial ankle about 6 days prior to admission. About 3 days ago she noticed swelling in the area with progressive pain and difficulty bearing weight. Earlier today she noticed underlying fluid without active discharge. She therefore came to the ED forfurther evaluation and management.  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 She is Sabrina Andrews chronic smoker of 0.5 PPDsince age 64. She denies any alcohol use. She reports Sabrina Andrews history of bipolar disorder previously prescribed Lamictal, Celexa, BuSpar, and trazodone;she hasnot taking any of these medications for 2 months since she started using IV drugs. She reports Sabrina Andrews  history of diabetes in both of her parents.  Pt was admitted for Sabrina Andrews R ankle abscess in the setting of IVDU.  She was on broad spectrum abx and had been I&D'd in the ED.  Planned to get MRI to evaluate for osteo/deep infection, but pt unable to tolerated this.  She wanted to leave AMA around noon today due to concern that her son was home alone.  Discussed risks of this.  She had AKI on 1/30 of unclear etiology.  Discussed importance of follow up for repeat labs for her AKI as well as follow up with PCP for follow up of wound.  Hospital Course:  Abscess of right medial ankle with surrounding cellulitis: Occurred at injection site, she does lick her needles. Plain films without evidence of osteo, though notable for soft tissue air and significant soft tissue swelling S/p I&D by EDP with wound packing in place, still with some purulent drainage Orthopedics not recommending any surgical procedure at this time Wound care c/s  MRI ordered to help r/o any deep tissue infection/osteo, but pt did not tolerate this Discharged AMA on augmentin/doxy (reduced dose of augmentin given AKI - discussed importance of follow up for renal func) Blood cx NGTD x2 Wound cx with MRSA  Acute Kidney Injury:  Possibly related to diarrhea, but workup unable to be started as pt left AMA Discussed importance of follow up labs   Substance use disorder  Opiate withdrawal  Positive Hep C Ab: Patient 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 UDS positive for cocaine and THC as well. Started on suboxone here Positive hep C ab, discussed importance of f/u to establish whether she has dx, discussed  need for follow up  Bipolar disorder: She reports Sabrina Andrews history of bipolar disorder previously prescribed Lamictal, Celexa, BuSpar, and trazodone;she hasnot taking any of these medications for 2 months. She reports occasional depression without suicidal thoughts. - follow up outpatient  Procedures:  ED I&D     Consultations:  orthopedics  Discharge Exam: Vitals:   07/25/18 2201 07/26/18 0559  BP: 103/62 99/89  Pulse: 69 73  Resp: 18 19  Temp: (!) 97.5 F (36.4 C) 97.7 F (36.5 C)  SpO2: 98% 98%   Pt left AMA Demonstrated capacity to leave AMA Said she had to take care of son Discussed risks including death Discussed importance of imaging, follow up renal function Prescribed augmentin/doxy. Instructed that she needs to f/u for labs tomorrow  General: No acute distress. Lungs: unlabored Neurological: Alert and oriented 3. Moves all extremities 4. Cranial nerves II through XII grossly intact. Extremities: RLE wound, improved redness Psychiatric: Mood and affect are normal. Insight and judgment are appropriate.     Discharge Instructions    Allergies as of 07/26/2018      Reactions   Ibuprofen Anaphylaxis, Hives   Nsaids Anaphylaxis, Hives   Tramadol Nausea And Vomiting      Medication List    STOP taking these medications   metroNIDAZOLE 500 MG tablet Commonly known as:  FLAGYL     TAKE these medications   amoxicillin-clavulanate 500-125 MG tablet Commonly known as:  AUGMENTIN Take 1 tablet (500 mg total) by mouth 2 (two) times daily for 14 days.   busPIRone 10 MG tablet Commonly known as:  BUSPAR Take 10 mg by mouth 3 (three) times daily.   citalopram 20 MG tablet Commonly known as:  CELEXA Take 20 mg by mouth daily.   doxycycline 100 MG capsule Commonly known as:  VIBRAMYCIN Take 1 capsule (100 mg total) by mouth 2 (two) times daily for 14 days.   gabapentin 300 MG capsule Commonly known as:  NEURONTIN Take 300 mg by mouth 3 (three) times daily.   lamoTRIgine 200 MG tablet Commonly known as:  LAMICTAL Take 1 tablet (200 mg total) by mouth daily.   naloxone 4 MG/0.1ML Liqd nasal spray kit Commonly known as:  NARCAN Use as needed   Nitroglycerin 0.4 % Oint Place 1 application rectally 2 times daily at 12 noon and 4 pm.   traZODone 100 MG  tablet Commonly known as:  DESYREL Take 100 mg by mouth at bedtime.      Allergies  Allergen Reactions  . Ibuprofen Anaphylaxis and Hives  . Nsaids Anaphylaxis and Hives  . Tramadol Nausea And Vomiting      The results of significant diagnostics from this hospitalization (including imaging, microbiology, ancillary and laboratory) are listed below for reference.    Significant Diagnostic Studies: Dg Ankle Complete Right  Result Date: 07/24/2018 CLINICAL DATA:  Pt c/o right medial ankle abscess after injecting IV drugs into her ankle 5 days ago. Pt's abscess was drained in the ED tonight. No hx of prior injuries or surgeries to the area. EXAM: RIGHT ANKLE - COMPLETE 3+ VIEW COMPARISON:  None. FINDINGS: No fracture or bone lesion. No bone resorption to suggest osteomyelitis. The ankle joint is normally spaced and aligned. No arthropathic changes. There is soft tissue swelling medially with evidence of Carolynne Schuchard small amount of soft tissue air. No radiopaque foreign body. IMPRESSION: 1. No fracture, bone lesion or evidence of osteomyelitis. No ankle joint abnormality. 2. Soft tissue infection medially with Phoenix Riesen small amount of  soft tissue air and significant soft tissue swelling. No radiopaque foreign body. Electronically Signed   By: Lajean Manes M.D.   On: 07/24/2018 21:02    Microbiology: Recent Results (from the past 240 hour(s))  Culture, blood (routine x 2)     Status: None (Preliminary result)   Collection Time: 07/24/18  5:10 PM  Result Value Ref Range Status   Specimen Description BLOOD RIGHT HAND  Final   Special Requests   Final    BOTTLES DRAWN AEROBIC AND ANAEROBIC Blood Culture adequate volume   Culture   Final    NO GROWTH 2 DAYS Performed at [facility], [address], [place], [place] [address]    Report Status PENDING  Incomplete  Culture, blood (routine x 2)     Status: None (Preliminary result)   Collection Time: 07/24/18  5:32 PM  Result Value Ref Range Status    Specimen Description BLOOD RIGHT FOREARM  Final   Special Requests   Final    BOTTLES DRAWN AEROBIC AND ANAEROBIC Blood Culture adequate volume   Culture   Final    NO GROWTH 2 DAYS Performed at [facility], [place] [address] [place], [place] [address]    Report Status PENDING  Incomplete  Aerobic/Anaerobic Culture (surgical/deep wound)     Status: None (Preliminary result)   Collection Time: 07/24/18  6:55 PM  Result Value Ref Range Status   Specimen Description ABSCESS ANKLE  Final   Special Requests Normal  Final   Gram Stain   Final    FEW WBC PRESENT, PREDOMINANTLY PMN MODERATE GRAM POSITIVE COCCI Performed at [facility], [address], [place], [place] [address]    Culture   Final    FEW METHICILLIN RESISTANT STAPHYLOCOCCUS AUREUS NO ANAEROBES ISOLATED; CULTURE IN PROGRESS FOR 5 DAYS    Report Status PENDING  Incomplete   Organism ID, Bacteria METHICILLIN RESISTANT STAPHYLOCOCCUS AUREUS  Final      Susceptibility   Methicillin resistant staphylococcus aureus - MIC*    CIPROFLOXACIN >=8 RESISTANT Resistant     ERYTHROMYCIN >=8 RESISTANT Resistant     GENTAMICIN <=0.5 SENSITIVE Sensitive     OXACILLIN >=4 RESISTANT Resistant     TETRACYCLINE <=1 SENSITIVE Sensitive     VANCOMYCIN <=0.5 SENSITIVE Sensitive     TRIMETH/SULFA >=320 RESISTANT Resistant     CLINDAMYCIN >=8 RESISTANT Resistant     RIFAMPIN <=0.5 SENSITIVE Sensitive     Inducible Clindamycin NEGATIVE Sensitive     * FEW METHICILLIN RESISTANT STAPHYLOCOCCUS AUREUS  MRSA PCR Screening     Status: Abnormal   Collection Time: 07/26/18 11:16 AM  Result Value Ref Range Status   MRSA by PCR POSITIVE (Gabriela Irigoyen) NEGATIVE Final    Comment:        The GeneXpert MRSA Assay (FDA approved for NASAL specimens only), is one component of Takuya Lariccia comprehensive MRSA colonization surveillance program. It is not intended to diagnose MRSA infection nor to guide or monitor treatment for MRSA infections. PATIENT DISCHARGED  OR EXPIRED Performed at [facility], [place] [address], [place],  [address]      Labs: Basic Metabolic Panel: Recent Labs  Lab 07/24/18 1729 07/25/18 0148 07/26/18 0341  NA 135 139 141  K 3.6 3.5 3.1*  CL 97* 103 105  CO2 _0 GLUCOSE 86 91 119*  BUN _1 CREATININE 0.64 0.66 2.04*  CALCIUM 9.4 8.9 9.0  MG  --   --  2.1   Liver Function Tests: Recent Labs  Lab 07/24/18 1729 07/26/18 0341  AST 31 33  ALT 48* 37  ALKPHOS 91 65  BILITOT 0.6 0.5  PROT 9.3* 7.4  ALBUMIN 2.9* 2.4*   No results for input(s): LIPASE, AMYLASE in the last 168 hours. No results for input(s): AMMONIA in the last 168 hours. CBC: Recent Labs  Lab 07/24/18 1729 07/25/18 0148 07/26/18 0341  WBC 12.7* 10.3 8.1  NEUTROABS 8.1*  --   --   HGB 15.4* 13.1 13.3  HCT 47.3* 40.4 41.3  MCV 88.1 86.3 86.2  PLT 330 300 288   Cardiac Enzymes: No results for input(s): CKTOTAL, CKMB, CKMBINDEX, TROPONINI in the last 168 hours. BNP: BNP (last 3 results) No results for input(s): BNP in the last 8760 hours.  ProBNP (last 3 results) No results for input(s): PROBNP in the last 8760 hours.  CBG: No results for input(s): GLUCAP in the last 168 hours.     Signed:  Fayrene Helper MD.  [facility] 07/26/2018, 5:47 PM

## 2018-07-26 NOTE — Progress Notes (Signed)
Pt is irritable, wanting to go home. MD paged that pt is going home against medical advise

## 2018-07-26 NOTE — Progress Notes (Signed)
Patient still went home AMA. MD was in to talked with the patient but still insisting to go home. Supplies for dressing changes given to patient. SW/CM was in and talked with patient needs.

## 2018-07-29 LAB — CULTURE, BLOOD (ROUTINE X 2)
CULTURE: NO GROWTH
Culture: NO GROWTH
Special Requests: ADEQUATE
Special Requests: ADEQUATE

## 2018-07-30 LAB — AEROBIC/ANAEROBIC CULTURE W GRAM STAIN (SURGICAL/DEEP WOUND): Special Requests: NORMAL

## 2018-11-13 ENCOUNTER — Emergency Department (HOSPITAL_BASED_OUTPATIENT_CLINIC_OR_DEPARTMENT_OTHER): Payer: Medicaid Other

## 2018-11-13 ENCOUNTER — Emergency Department (HOSPITAL_BASED_OUTPATIENT_CLINIC_OR_DEPARTMENT_OTHER)
Admission: EM | Admit: 2018-11-13 | Discharge: 2018-11-13 | Disposition: A | Discharge status: Home / Self Care | Payer: Medicaid Other | Attending: Emergency Medicine | Admitting: Emergency Medicine

## 2018-11-13 ENCOUNTER — Encounter (HOSPITAL_BASED_OUTPATIENT_CLINIC_OR_DEPARTMENT_OTHER): Payer: Self-pay | Admitting: Emergency Medicine

## 2018-11-13 ENCOUNTER — Other Ambulatory Visit: Payer: Self-pay

## 2018-11-13 DIAGNOSIS — L03114 Cellulitis of left upper limb: Secondary | ICD-10-CM

## 2018-11-13 DIAGNOSIS — F1110 Opioid abuse, uncomplicated: Secondary | ICD-10-CM | POA: Insufficient documentation

## 2018-11-13 DIAGNOSIS — R2232 Localized swelling, mass and lump, left upper limb: Secondary | ICD-10-CM | POA: Insufficient documentation

## 2018-11-13 DIAGNOSIS — F17210 Nicotine dependence, cigarettes, uncomplicated: Secondary | ICD-10-CM | POA: Diagnosis not present

## 2018-11-13 DIAGNOSIS — L0390 Cellulitis, unspecified: Secondary | ICD-10-CM

## 2018-11-13 DIAGNOSIS — L02519 Cutaneous abscess of unspecified hand: Secondary | ICD-10-CM

## 2018-11-13 DIAGNOSIS — Z1159 Encounter for screening for other viral diseases: Secondary | ICD-10-CM | POA: Insufficient documentation

## 2018-11-13 DIAGNOSIS — I809 Phlebitis and thrombophlebitis of unspecified site: Secondary | ICD-10-CM

## 2018-11-13 DIAGNOSIS — Z79899 Other long term (current) drug therapy: Secondary | ICD-10-CM | POA: Diagnosis not present

## 2018-11-13 DIAGNOSIS — W010XXA Fall on same level from slipping, tripping and stumbling without subsequent striking against object, initial encounter: Secondary | ICD-10-CM | POA: Diagnosis not present

## 2018-11-13 DIAGNOSIS — Y929 Unspecified place or not applicable: Secondary | ICD-10-CM | POA: Insufficient documentation

## 2018-11-13 DIAGNOSIS — Y939 Activity, unspecified: Secondary | ICD-10-CM | POA: Diagnosis not present

## 2018-11-13 DIAGNOSIS — L02512 Cutaneous abscess of left hand: Secondary | ICD-10-CM | POA: Insufficient documentation

## 2018-11-13 DIAGNOSIS — R112 Nausea with vomiting, unspecified: Secondary | ICD-10-CM | POA: Diagnosis not present

## 2018-11-13 DIAGNOSIS — S8391XA Sprain of unspecified site of right knee, initial encounter: Secondary | ICD-10-CM | POA: Diagnosis not present

## 2018-11-13 DIAGNOSIS — J45909 Unspecified asthma, uncomplicated: Secondary | ICD-10-CM | POA: Diagnosis not present

## 2018-11-13 DIAGNOSIS — Z20828 Contact with and (suspected) exposure to other viral communicable diseases: Secondary | ICD-10-CM | POA: Insufficient documentation

## 2018-11-13 DIAGNOSIS — Y999 Unspecified external cause status: Secondary | ICD-10-CM | POA: Diagnosis not present

## 2018-11-13 HISTORY — DX: Other psychoactive substance abuse, uncomplicated: F19.10

## 2018-11-13 LAB — URINALYSIS, MICROSCOPIC (REFLEX)

## 2018-11-13 LAB — CBC WITH DIFFERENTIAL/PLATELET
Abs Immature Granulocytes: 0.15 10*3/uL — ABNORMAL HIGH (ref 0.00–0.07)
Basophils Absolute: 0.1 10*3/uL (ref 0.0–0.1)
Basophils Relative: 0 %
Eosinophils Absolute: 0.1 10*3/uL (ref 0.0–0.5)
Eosinophils Relative: 1 %
HCT: 46.2 % — ABNORMAL HIGH (ref 36.0–46.0)
Hemoglobin: 15 g/dL (ref 12.0–15.0)
Immature Granulocytes: 1 %
Lymphocytes Relative: 7 %
Lymphs Abs: 1.5 10*3/uL (ref 0.7–4.0)
MCH: 28.8 pg (ref 26.0–34.0)
MCHC: 32.5 g/dL (ref 30.0–36.0)
MCV: 88.7 fL (ref 80.0–100.0)
Monocytes Absolute: 1.1 10*3/uL — ABNORMAL HIGH (ref 0.1–1.0)
Monocytes Relative: 5 %
Neutro Abs: 17.9 10*3/uL — ABNORMAL HIGH (ref 1.7–7.7)
Neutrophils Relative %: 86 %
Platelets: 325 10*3/uL (ref 150–400)
RBC: 5.21 MIL/uL — ABNORMAL HIGH (ref 3.87–5.11)
RDW: 15.4 % (ref 11.5–15.5)
WBC: 20.8 10*3/uL — ABNORMAL HIGH (ref 4.0–10.5)
nRBC: 0 % (ref 0.0–0.2)

## 2018-11-13 LAB — URINALYSIS, ROUTINE W REFLEX MICROSCOPIC
Glucose, UA: NEGATIVE mg/dL
Ketones, ur: NEGATIVE mg/dL
Leukocytes,Ua: NEGATIVE
Nitrite: NEGATIVE
Protein, ur: 30 mg/dL — AB
Specific Gravity, Urine: 1.025 (ref 1.005–1.030)
pH: 5.5 (ref 5.0–8.0)

## 2018-11-13 LAB — COMPREHENSIVE METABOLIC PANEL
ALT: 173 U/L — ABNORMAL HIGH (ref 0–44)
AST: 35 U/L (ref 15–41)
Albumin: 3.2 g/dL — ABNORMAL LOW (ref 3.5–5.0)
Alkaline Phosphatase: 129 U/L — ABNORMAL HIGH (ref 38–126)
Anion gap: 12 (ref 5–15)
BUN: 14 mg/dL (ref 6–20)
CO2: 26 mmol/L (ref 22–32)
Calcium: 9.4 mg/dL (ref 8.9–10.3)
Chloride: 94 mmol/L — ABNORMAL LOW (ref 98–111)
Creatinine, Ser: 0.67 mg/dL (ref 0.44–1.00)
GFR calc Af Amer: >60 mL/min (ref >60)
GFR calc non Af Amer: >60 mL/min (ref >60)
Glucose, Bld: 155 mg/dL — ABNORMAL HIGH (ref 70–99)
Potassium: 3.2 mmol/L — ABNORMAL LOW (ref 3.5–5.1)
Sodium: 132 mmol/L — ABNORMAL LOW (ref 135–145)
Total Bilirubin: 1.3 mg/dL — ABNORMAL HIGH (ref 0.3–1.2)
Total Protein: 9 g/dL — ABNORMAL HIGH (ref 6.5–8.1)

## 2018-11-13 LAB — LACTIC ACID, PLASMA
Lactic Acid, Venous: 1.9 mmol/L (ref 0.5–1.9)
Lactic Acid, Venous: 2.1 mmol/L (ref 0.5–1.9)

## 2018-11-13 LAB — PREGNANCY, URINE: Preg Test, Ur: NEGATIVE

## 2018-11-13 LAB — SEDIMENTATION RATE: Sed Rate: 64 mm/hr — ABNORMAL HIGH (ref 0–22)

## 2018-11-13 LAB — SARS CORONAVIRUS 2 AG (30 MIN TAT): SARS Coronavirus 2 Ag: NEGATIVE

## 2018-11-13 LAB — C-REACTIVE PROTEIN: CRP: 30 mg/dL — ABNORMAL HIGH (ref <1.0)

## 2018-11-13 MED ORDER — VANCOMYCIN HCL IN DEXTROSE 1-5 GM/200ML-% IV SOLN
1000.0000 mg | Freq: Once | INTRAVENOUS | Status: AC
  Administered 2018-11-13: 1000 mg via INTRAVENOUS
  Filled 2018-11-13: qty 200

## 2018-11-13 MED ORDER — CLONIDINE HCL 0.1 MG PO TABS
0.1000 mg | ORAL_TABLET | Freq: Once | ORAL | Status: AC
  Administered 2018-11-13: 0.1 mg via ORAL
  Filled 2018-11-13: qty 1

## 2018-11-13 MED ORDER — SODIUM CHLORIDE 0.9 % IV BOLUS
1000.0000 mL | Freq: Once | INTRAVENOUS | Status: AC
  Administered 2018-11-13: 1000 mL via INTRAVENOUS

## 2018-11-13 MED ORDER — ONDANSETRON HCL 4 MG/2ML IJ SOLN
4.0000 mg | Freq: Once | INTRAMUSCULAR | Status: AC
  Administered 2018-11-13: 4 mg via INTRAVENOUS
  Filled 2018-11-13: qty 2

## 2018-11-13 MED ORDER — PROMETHAZINE HCL 25 MG/ML IJ SOLN
INTRAMUSCULAR | Status: AC
  Filled 2018-11-13: qty 1

## 2018-11-13 MED ORDER — PROMETHAZINE HCL 25 MG/ML IJ SOLN
12.5000 mg | Freq: Once | INTRAMUSCULAR | Status: AC
  Administered 2018-11-13: 13:00:00 12.5 mg via INTRAVENOUS
  Filled 2018-11-13: qty 1

## 2018-11-13 MED ORDER — DOXYCYCLINE HYCLATE 100 MG PO CAPS: 100.0000 mg | ORAL_CAPSULE | Freq: Two times a day (BID) | ORAL | 0 refills | Status: AC

## 2018-11-13 MED ORDER — PROMETHAZINE HCL 25 MG PO TABS: 25.0000 mg | ORAL_TABLET | Freq: Four times a day (QID) | ORAL | 0 refills | Status: AC | PRN

## 2018-11-13 MED ORDER — PROMETHAZINE HCL 25 MG/ML IJ SOLN
12.5000 mg | Freq: Once | INTRAMUSCULAR | Status: AC
  Administered 2018-11-13: 12.5 mg via INTRAVENOUS

## 2018-11-13 NOTE — ED Notes (Signed)
ED Provider at bedside. 
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
## 2018-11-13 NOTE — ED Notes (Signed)
MD notified of patient's request for pain medicine.  As per conversation, I told her that I can give her Tylenol.  She stated that she needs stronger than Tylenol.  They usually give me Morphine and Dilaudid.

## 2018-11-13 NOTE — ED Notes (Signed)
Patient's left great toenails broke while she was in bed.  Site is cleansed with NS and Bacitracin applied to site.

## 2018-11-13 NOTE — Discharge Instructions (Addendum)
You are leaving against advice.  Take the antibiotics.  Follow-up with your doctor soon as possible for recheck.  Return sooner if symptoms worsen or you change your mind about the treatment.

## 2018-11-14 ENCOUNTER — Telehealth (HOSPITAL_BASED_OUTPATIENT_CLINIC_OR_DEPARTMENT_OTHER): Payer: Self-pay | Admitting: *Deleted

## 2018-11-14 LAB — BLOOD CULTURE ID PANEL (REFLEXED)

## 2018-11-16 LAB — CULTURE, BLOOD (ROUTINE X 2): Special Requests: ADEQUATE

## 2018-11-17 NOTE — Progress Notes (Signed)
ED Antimicrobial Stewardship Positive Culture Follow Up   Sabrina Andrews is an 38 y.o. female who presented to [facility] on 11/13/2018 with a chief complaint of  Chief Complaint  Patient presents with  . Back & knee pain and Abscess    Recent Results (from the past 720 hour(s))  Culture, blood (routine x 2)     Status: Abnormal   Collection Time: 11/13/18 10:30 AM  Result Value Ref Range Status   Specimen Description   Final    BLOOD BLOOD RIGHT ARM Performed at [facility], [address] [place], [place] [address]    Special Requests   Final    BOTTLES DRAWN AEROBIC AND ANAEROBIC Blood Culture adequate volume Performed at [facility], [address] [place], [place] [address]    Culture  Setup Time   Final    GRAM POSITIVE COCCI IN BOTH AEROBIC AND ANAEROBIC BOTTLES CRITICAL VALUE NOTED.  VALUE IS CONSISTENT WITH PREVIOUSLY REPORTED AND CALLED VALUE.    Culture (A)  Final    GROUP A STREP (S.PYOGENES) ISOLATED SUSCEPTIBILITIES PERFORMED ON PREVIOUS CULTURE WITHIN THE LAST 5 DAYS. HEALTH DEPARTMENT NOTIFIED Performed at [facility], [place] [address] [place], [place] [address]    Report Status 11/16/2018 FINAL  Final  Culture, blood (routine x 2)     Status: Abnormal   Collection Time: 11/13/18 10:50 AM  Result Value Ref Range Status   Specimen Description   Final    BLOOD BLOOD RIGHT HAND Performed at [facility], [address] [place], [place] [address]    Special Requests   Final    BOTTLES DRAWN AEROBIC ONLY Blood Culture results may not be optimal due to an inadequate volume of blood received in culture bottles Performed at [facility], [address] [place], [place] [address]    Culture  Setup Time   Final    GRAM POSITIVE COCCI AEROBIC BOTTLE ONLY CRITICAL RESULT CALLED TO, READ BACK BY AND VERIFIED WITH: RN L ADKINS @0441  11/14/18 BY S GEZAHEGN Performed at [facility], [place] [address],  [place], [place] [address]    Culture STREPTOCOCCUS PYOGENES (A)  Final   Report Status 11/16/2018 FINAL  Final   Organism ID, Bacteria STREPTOCOCCUS PYOGENES  Final      Susceptibility   Streptococcus pyogenes - MIC*    PENICILLIN <=0.06 SENSITIVE Sensitive     CEFTRIAXONE <=0.12 SENSITIVE Sensitive     ERYTHROMYCIN >=8 RESISTANT Resistant     LEVOFLOXACIN 0.5 SENSITIVE Sensitive     VANCOMYCIN <=0.12 SENSITIVE Sensitive     * STREPTOCOCCUS PYOGENES  Blood Culture ID Panel (Reflexed)     Status: Abnormal   Collection Time: 11/13/18 10:50 AM  Result Value Ref Range Status   Enterococcus species NOT DETECTED NOT DETECTED Final   Listeria monocytogenes NOT DETECTED NOT DETECTED Final   Staphylococcus species NOT DETECTED NOT DETECTED Final   Staphylococcus aureus (BCID) NOT DETECTED NOT DETECTED Final   Streptococcus species DETECTED (A) NOT DETECTED Final    Comment: CRITICAL RESULT CALLED TO, READ BACK BY AND VERIFIED WITH: RN L ADKINS @0441  11/14/18 BY S GEZAHEGN    Streptococcus agalactiae NOT DETECTED NOT DETECTED Final   Streptococcus pneumoniae NOT DETECTED NOT DETECTED Final   Streptococcus pyogenes DETECTED (A) NOT DETECTED Final    Comment: CRITICAL RESULT CALLED TO, READ BACK BY AND VERIFIED WITH: RN L ADKINS @0441   11/14/18 BY S GEZAHEGN    Acinetobacter baumannii NOT DETECTED NOT DETECTED Final   Enterobacteriaceae species NOT DETECTED NOT DETECTED Final   Enterobacter cloacae complex NOT DETECTED NOT DETECTED Final   Escherichia coli NOT DETECTED NOT DETECTED Final   Klebsiella oxytoca NOT DETECTED NOT DETECTED Final   Klebsiella pneumoniae NOT DETECTED NOT DETECTED Final   Proteus species NOT DETECTED NOT DETECTED Final   Serratia marcescens NOT DETECTED NOT DETECTED Final   Haemophilus influenzae NOT DETECTED NOT DETECTED Final   Neisseria meningitidis NOT DETECTED NOT DETECTED Final   Pseudomonas aeruginosa NOT DETECTED NOT DETECTED Final   Candida albicans NOT DETECTED  NOT DETECTED Final   Candida glabrata NOT DETECTED NOT DETECTED Final   Candida krusei NOT DETECTED NOT DETECTED Final   Candida parapsilosis NOT DETECTED NOT DETECTED Final   Candida tropicalis NOT DETECTED NOT DETECTED Final    Comment: Performed at [facility], [place] [address] [place], [place] [address]  SARS Coronavirus 2 (Hosp order,Performed in [facility] lab via [employer] [facility])     Status: None   Collection Time: 11/13/18 12:56 PM  Result Value Ref Range Status   SARS Coronavirus 2 ([employer] Now) NEGATIVE NEGATIVE Final    Comment: (NOTE) Interpretive Result Comment(s): COVID 19 Positive SARS CoV 2 target nucleic acids are DETECTED. The SARS CoV 2 RNA is generally detectable in upper and lower respiratory specimens during the acute phase of infection.  Positive results are indicative of active infection with SARS CoV 2.  Clinical correlation with patient history and other diagnostic information is necessary to determine patient infection status.  Positive results do not rule out bacterial infection or coinfection with other viruses. The expected result is Negative. COVID 19 Negative SARS CoV 2 target nucleic acids are NOT DETECTED. The SARS CoV 2 RNA is generally detectable in upper and lower respiratory specimens during the acute phase of infection.  Negative results do not preclude SARS CoV 2 infection, do not rule out coinfections with other pathogens, and should not be used as the sole basis for treatment or other patient management decisions.  Negative results must be combined with clinical  observations, patient history, and epidemiological information. The expected result is Negative. Invalid Presence or absence of SARS CoV 2 nucleic acids cannot be determined. Repeat testing was performed on the submitted specimen and repeated Invalid results were obtained.  If clinically indicated, additional testing on a new specimen with an alternate test  methodology [phone]) is advised.  The SARS CoV 2 RNA is generally detectable in upper and lower respiratory specimens during the acute phase of infection. The expected result is Negative. Fact Sheet for Patients:  GolfingFamily.no Fact Sheet for Healthcare Providers: https://www.hernandez-brewer.com/ This test is not yet approved or cleared by the [place] FDA and has been authorized for detection and/or diagnosis of SARS CoV 2 by FDA under an Emergency Use Authorization (EUA).  This EUA will remain in effect (meaning this test can be used) for the duration of the COVID19 d eclaration under Section 564(b)(1) of the Act, 21 U.S.C. section [phone] 3(b)(1), unless the authorization is terminated or revoked sooner. 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 Now  noted to have both blood cultures positive for Strep pyogenes. Attempts have been made to contact the patient for return to the ED and she has yet to show up.   Additional attempts should be made to contact the patient for re-evaluation however if she refuses - we need to transition the antibiotic regimen to better cover the pathogen. This patient was discussed with Dr. Tommy Medal with ID who suggested Augmentin 875 mg bid for 4 weeks.   Needs additional follow-up: - Attempt to contact the patient to return to the ED for re-evaluation. If the patient refuses - will need to d/c Doxy and start Augmentin 875 mg bid for 4 weeks.   ED Provider: Martinique Robinson, PA-C  Alycia Rossetti, PharmD, BCPS 11/17/2018 12:37 PM   Clinical Pharmacist Monday - Friday phone -  [phone] Saturday - Sunday  phone - [phone]

## 2018-11-18 ENCOUNTER — Telehealth: Payer: Self-pay | Admitting: Emergency Medicine

## 2018-11-18 NOTE — Telephone Encounter (Signed)
Post ED Visit - Positive Culture Follow-up: Unsuccessful Patient Follow-up  Culture assessed and recommendations reviewed by:  []  Elenor Quinones, Pharm.D. []  Heide Guile, Pharm.D., BCPS AQ-ID []  Parks Neptune, Pharm.D., BCPS [x]  Alycia Rossetti, Pharm.D., BCPS []  [place], Pharm.D., BCPS, AAHIVP []  Legrand Como, Pharm.D., BCPS, AAHIVP []  Wynell Balloon, PharmD []  Vincenza Hews, PharmD, BCPS  Positive blood culture  []  Patient discharged without antimicrobial prescription and treatment is now indicated [x]  Organism is resistant to prescribed ED discharge antimicrobial []  Patient with positive blood cultures   Unable to contact patient after 3 attempts,  letter will be sent to address on file Left messages with phone number on file and Mother's number listed as contact.  Sabrina Andrews 11/18/2018, 4:49 PM

## 2019-07-31 ENCOUNTER — Telehealth: Payer: Self-pay | Admitting: Pharmacy Technician

## 2019-07-31 ENCOUNTER — Encounter: Payer: Medicaid Other | Admitting: Family

## 2019-07-31 ENCOUNTER — Other Ambulatory Visit: Payer: Self-pay

## 2019-07-31 NOTE — Telephone Encounter (Signed)
RCID Patient Teacher, English as a foreign language completed.    The patient is insured through [facility] and has a $3 copay.  We will need a readiness to treat form signed to get prior authorization approval.  Sabrina Andrews E. Nadara Mustard [place] Patient [facility] Phone: [phone] Fax:  [phone]

## 2019-09-23 ENCOUNTER — Encounter: Payer: Medicaid Other | Admitting: Family

## 2019-09-23 ENCOUNTER — Telehealth: Payer: Self-pay | Admitting: Pharmacy Technician

## 2019-09-23 NOTE — Telephone Encounter (Signed)
RCID Patient Teacher, English as a foreign language completed.    The patient is insured through [facility] and has a $3 copay.  We will need a readiness to treat form signed to get prior authorization approval.  Inez Catalina E. Nadara Mustard [place] Patient [facility] Phone: [phone] Fax:  [phone]
